# Patient Record
Sex: Female | Born: 1970 | Race: Black or African American | Hispanic: No | Marital: Married | Smoking: Never smoker
Health system: Southern US, Community
[De-identification: ages and names within clinical notes are randomized; demographics above are authoritative.]

## PROBLEM LIST (undated history)

## (undated) DIAGNOSIS — J45909 Unspecified asthma, uncomplicated: Secondary | ICD-10-CM

## (undated) DIAGNOSIS — T4145XA Adverse effect of unspecified anesthetic, initial encounter: Secondary | ICD-10-CM

## (undated) DIAGNOSIS — R569 Unspecified convulsions: Secondary | ICD-10-CM

## (undated) DIAGNOSIS — I340 Nonrheumatic mitral (valve) insufficiency: Secondary | ICD-10-CM

## (undated) DIAGNOSIS — K219 Gastro-esophageal reflux disease without esophagitis: Secondary | ICD-10-CM

## (undated) DIAGNOSIS — E119 Type 2 diabetes mellitus without complications: Secondary | ICD-10-CM

## (undated) DIAGNOSIS — I5189 Other ill-defined heart diseases: Secondary | ICD-10-CM

## (undated) DIAGNOSIS — J209 Acute bronchitis, unspecified: Secondary | ICD-10-CM

## (undated) DIAGNOSIS — D649 Anemia, unspecified: Secondary | ICD-10-CM

## (undated) DIAGNOSIS — G4733 Obstructive sleep apnea (adult) (pediatric): Secondary | ICD-10-CM

## (undated) DIAGNOSIS — R42 Dizziness and giddiness: Secondary | ICD-10-CM

## (undated) DIAGNOSIS — G4730 Sleep apnea, unspecified: Secondary | ICD-10-CM

## (undated) DIAGNOSIS — T7840XA Allergy, unspecified, initial encounter: Secondary | ICD-10-CM

## (undated) DIAGNOSIS — I10 Essential (primary) hypertension: Secondary | ICD-10-CM

## (undated) DIAGNOSIS — T8859XA Other complications of anesthesia, initial encounter: Secondary | ICD-10-CM

## (undated) HISTORY — DX: Acute bronchitis, unspecified: J20.9

## (undated) HISTORY — PX: REPLACEMENT TOTAL KNEE: SUR1224

## (undated) HISTORY — PX: GASTRIC BYPASS: SHX52

## (undated) HISTORY — DX: Gastro-esophageal reflux disease without esophagitis: K21.9

## (undated) HISTORY — DX: Allergy, unspecified, initial encounter: T78.40XA

---

## 1990-05-31 HISTORY — PX: FINGER SURGERY: SHX640

## 2004-11-18 ENCOUNTER — Emergency Department: Payer: Self-pay | Admitting: Emergency Medicine

## 2005-02-14 ENCOUNTER — Emergency Department: Payer: Self-pay | Admitting: Internal Medicine

## 2006-07-11 ENCOUNTER — Emergency Department: Payer: Self-pay | Admitting: Emergency Medicine

## 2008-05-31 HISTORY — PX: INGUINAL HERNIA REPAIR: SUR1180

## 2009-04-07 ENCOUNTER — Ambulatory Visit: Payer: Self-pay | Admitting: Internal Medicine

## 2009-04-08 ENCOUNTER — Ambulatory Visit: Payer: Self-pay | Admitting: Internal Medicine

## 2009-04-22 ENCOUNTER — Emergency Department: Payer: Self-pay | Admitting: Emergency Medicine

## 2009-04-29 ENCOUNTER — Ambulatory Visit: Payer: Self-pay | Admitting: Surgery

## 2009-05-05 ENCOUNTER — Ambulatory Visit: Payer: Self-pay | Admitting: Surgery

## 2009-05-09 ENCOUNTER — Ambulatory Visit: Payer: Self-pay | Admitting: Surgery

## 2009-07-10 ENCOUNTER — Ambulatory Visit: Payer: Self-pay | Admitting: Internal Medicine

## 2011-04-12 ENCOUNTER — Ambulatory Visit: Payer: Self-pay

## 2011-04-28 ENCOUNTER — Ambulatory Visit: Payer: Self-pay

## 2011-05-03 ENCOUNTER — Ambulatory Visit: Payer: Self-pay | Admitting: Internal Medicine

## 2011-05-07 ENCOUNTER — Ambulatory Visit: Payer: Self-pay | Admitting: Internal Medicine

## 2011-06-01 ENCOUNTER — Ambulatory Visit: Payer: Self-pay | Admitting: Internal Medicine

## 2011-07-20 ENCOUNTER — Ambulatory Visit: Payer: Self-pay | Admitting: Internal Medicine

## 2011-07-20 LAB — CBC CANCER CENTER
Basophil %: 0.5 %
Eosinophil #: 0.2 x10 3/mm (ref 0.0–0.7)
Eosinophil %: 2.9 %
HGB: 13.3 g/dL (ref 12.0–16.0)
Lymphocyte %: 42.4 %
MCHC: 33.5 g/dL (ref 32.0–36.0)
MCV: 89 fL (ref 80–100)
Monocyte %: 8.7 %
Neutrophil #: 2.4 x10 3/mm (ref 1.4–6.5)
Neutrophil %: 45.5 %
RBC: 4.45 10*6/uL (ref 3.80–5.20)
WBC: 5.4 x10 3/mm (ref 3.6–11.0)

## 2011-07-20 LAB — IRON AND TIBC
Iron Bind.Cap.(Total): 404 ug/dL (ref 250–450)
Iron: 80 ug/dL (ref 50–170)
Unbound Iron-Bind.Cap.: 324 ug/dL

## 2011-07-20 LAB — FERRITIN: Ferritin (ARMC): 36 ng/mL (ref 8–388)

## 2011-07-30 ENCOUNTER — Ambulatory Visit: Payer: Self-pay | Admitting: Internal Medicine

## 2011-12-08 ENCOUNTER — Ambulatory Visit: Payer: Self-pay | Admitting: Internal Medicine

## 2011-12-08 LAB — IRON AND TIBC
Iron Saturation: 12 %
Unbound Iron-Bind.Cap.: 363 ug/dL

## 2011-12-30 ENCOUNTER — Ambulatory Visit: Payer: Self-pay | Admitting: Internal Medicine

## 2012-03-20 ENCOUNTER — Ambulatory Visit: Payer: Self-pay | Admitting: Internal Medicine

## 2012-03-20 LAB — CBC CANCER CENTER
Eosinophil #: 0.2 x10 3/mm (ref 0.0–0.7)
MCHC: 32 g/dL (ref 32.0–36.0)
MCV: 89 fL (ref 80–100)
Monocyte #: 0.5 x10 3/mm (ref 0.2–0.9)
Monocyte %: 9.1 %
Neutrophil #: 2.5 x10 3/mm (ref 1.4–6.5)
Platelet: 218 x10 3/mm (ref 150–440)
RDW: 13.6 % (ref 11.5–14.5)
WBC: 5.3 x10 3/mm (ref 3.6–11.0)

## 2012-03-20 LAB — IRON AND TIBC
Iron Bind.Cap.(Total): 420 ug/dL (ref 250–450)
Iron Saturation: 19 %
Iron: 81 ug/dL (ref 50–170)
Unbound Iron-Bind.Cap.: 339 ug/dL

## 2012-03-20 LAB — FERRITIN: Ferritin (ARMC): 22 ng/mL (ref 8–388)

## 2012-03-31 ENCOUNTER — Ambulatory Visit: Payer: Self-pay | Admitting: Internal Medicine

## 2012-06-28 ENCOUNTER — Ambulatory Visit: Payer: Self-pay

## 2012-06-28 LAB — LIPID PANEL
Cholesterol: 158 mg/dL (ref 0–200)
HDL Cholesterol: 33 mg/dL — ABNORMAL LOW (ref 40–60)
Ldl Cholesterol, Calc: 100 mg/dL (ref 0–100)
Triglycerides: 125 mg/dL (ref 0–200)
VLDL Cholesterol, Calc: 25 mg/dL (ref 5–40)

## 2012-06-28 LAB — COMPREHENSIVE METABOLIC PANEL
Albumin: 3.4 g/dL (ref 3.4–5.0)
Alkaline Phosphatase: 100 U/L (ref 50–136)
BUN: 13 mg/dL (ref 7–18)
Chloride: 107 mmol/L (ref 98–107)
Creatinine: 0.76 mg/dL (ref 0.60–1.30)
EGFR (African American): >60
EGFR (Non-African Amer.): >60
Osmolality: 279 (ref 275–301)
Potassium: 3.5 mmol/L (ref 3.5–5.1)

## 2012-06-28 LAB — CBC WITH DIFFERENTIAL/PLATELET
Basophil %: 0.9 %
Eosinophil #: 0.2 10*3/uL (ref 0.0–0.7)
HGB: 12.6 g/dL (ref 12.0–16.0)
Lymphocyte #: 2.1 10*3/uL (ref 1.0–3.6)
Neutrophil %: 52.4 %
Platelet: 224 10*3/uL (ref 150–440)
RBC: 4.33 10*6/uL (ref 3.80–5.20)
RDW: 14.4 % (ref 11.5–14.5)
WBC: 5.9 10*3/uL (ref 3.6–11.0)

## 2012-07-20 ENCOUNTER — Ambulatory Visit: Payer: Self-pay | Admitting: Internal Medicine

## 2012-10-29 ENCOUNTER — Ambulatory Visit: Payer: Self-pay | Admitting: Internal Medicine

## 2012-11-02 ENCOUNTER — Ambulatory Visit (INDEPENDENT_AMBULATORY_CARE_PROVIDER_SITE_OTHER): Payer: BC Managed Care – PPO | Admitting: Sports Medicine

## 2012-11-02 ENCOUNTER — Encounter: Payer: Self-pay | Admitting: Sports Medicine

## 2012-11-02 VITALS — BP 137/94 | HR 87 | Ht 62.0 in | Wt 294.0 lb

## 2012-11-02 DIAGNOSIS — M1712 Unilateral primary osteoarthritis, left knee: Secondary | ICD-10-CM | POA: Insufficient documentation

## 2012-11-02 DIAGNOSIS — M1710 Unilateral primary osteoarthritis, unspecified knee: Secondary | ICD-10-CM

## 2012-11-02 DIAGNOSIS — IMO0002 Reserved for concepts with insufficient information to code with codable children: Secondary | ICD-10-CM

## 2012-11-02 MED ORDER — TRAMADOL HCL 50 MG PO TABS: 50.0000 mg | ORAL_TABLET | Freq: Four times a day (QID) | ORAL | Status: DC | PRN

## 2012-11-02 NOTE — Patient Instructions (Addendum)
Thank you for coming in today. We do not see much of a bakers cyst today.  I think we can help you a bit with a brace today.  You will likely benefit from a knee replacement.  Please follow up with Dr. Rosita Kea today.  I will send him a note.  Come back here as needed.

## 2012-11-02 NOTE — Assessment & Plan Note (Signed)
Patient has had resolution of her Baker's cyst in the interim due to decreased activity.  The current Baker's cyst is too small to aspirate today. Plan to followup with orthopedics for knee replacement

## 2012-11-02 NOTE — Progress Notes (Signed)
[name] is a 42 y.o. female who presents to [facility] today for right posterior knee swelling. Patient has been seen by Dr. Rosita Kea at [facility] orth[facility]. She was diagnosed with a right knee DJD ultimately require any replacement. She was seen in early May where she had a large Baker's cyst. She had a knee aspiration and injection. She is referred here for attempted ultrasound-guided aspiration and injection of the Baker's cyst prior to knee replacement.  However in the interim she has had significantly reduced activity and notes resolution of swelling. She continues to have significant pain with motion. She denies any radiating pain weakness or numbness   PMH reviewed. Morbid obesity History  Substance Use Topics  . Smoking status: Never Smoker   . Smokeless tobacco: Never Used  . Alcohol Use: Not on file   ROS as above otherwise neg   Exam:  BP 137/94  Pulse 87  Ht 5\' 2"  (1.575 m)  Wt 294 lb (133.358 kg)  BMI 53.76 kg/m2 Gen: Well NAD MSK: Left knee significantly obese without effusion Range of motion 0-120 1+ retropatellar crepitation Nontender No Baker's cyst  Gait: Significant antalgic gait with Trendelenburg features   Limited musculoskeletal ultrasound of the left posterior knee. Very small Baker's cyst present less than 1 cm in diameter.

## 2012-11-09 ENCOUNTER — Ambulatory Visit: Payer: Self-pay | Admitting: Orthopedic Surgery

## 2012-11-28 ENCOUNTER — Ambulatory Visit: Payer: Self-pay | Admitting: Internal Medicine

## 2012-12-08 LAB — CBC CANCER CENTER
Eosinophil #: 0.2 x10 3/mm (ref 0.0–0.7)
Eosinophil %: 2.6 %
HCT: 39.1 % (ref 35.0–47.0)
HGB: 13.5 g/dL (ref 12.0–16.0)
Lymphocyte %: 28.5 %
MCHC: 34.6 g/dL (ref 32.0–36.0)
MCV: 90 fL (ref 80–100)
Neutrophil #: 3.8 x10 3/mm (ref 1.4–6.5)
Neutrophil %: 59.3 %
RBC: 4.37 10*6/uL (ref 3.80–5.20)

## 2012-12-08 LAB — IRON AND TIBC
Iron Bind.Cap.(Total): 371 ug/dL (ref 250–450)
Iron Saturation: 12 %
Iron: 43 ug/dL — ABNORMAL LOW (ref 50–170)
Unbound Iron-Bind.Cap.: 328 ug/dL

## 2012-12-11 ENCOUNTER — Ambulatory Visit: Payer: Self-pay | Admitting: Orthopedic Surgery

## 2012-12-11 LAB — URINALYSIS, COMPLETE
Bacteria: NONE SEEN
Bilirubin,UR: NEGATIVE
Glucose,UR: NEGATIVE mg/dL (ref 0–75)
Ketone: NEGATIVE
Nitrite: NEGATIVE
Protein: NEGATIVE
RBC,UR: 2 /HPF (ref 0–5)
Squamous Epithelial: 10
WBC UR: 4 /HPF (ref 0–5)

## 2012-12-11 LAB — MRSA PCR SCREENING

## 2012-12-11 LAB — BASIC METABOLIC PANEL
Anion Gap: 4 — ABNORMAL LOW (ref 7–16)
Chloride: 107 mmol/L (ref 98–107)
Glucose: 105 mg/dL — ABNORMAL HIGH (ref 65–99)
Osmolality: 279 (ref 275–301)
Potassium: 4.1 mmol/L (ref 3.5–5.1)
Sodium: 141 mmol/L (ref 136–145)

## 2012-12-11 LAB — CBC
HGB: 13.4 g/dL (ref 12.0–16.0)
MCH: 30.3 pg (ref 26.0–34.0)
RBC: 4.42 10*6/uL (ref 3.80–5.20)
RDW: 13.7 % (ref 11.5–14.5)

## 2012-12-11 LAB — APTT: Activated PTT: 28.7 secs (ref 23.6–35.9)

## 2012-12-19 ENCOUNTER — Inpatient Hospital Stay: Payer: Self-pay | Admitting: Orthopedic Surgery

## 2012-12-20 LAB — BASIC METABOLIC PANEL
BUN: 6 mg/dL — ABNORMAL LOW (ref 7–18)
Calcium, Total: 8.6 mg/dL (ref 8.5–10.1)
Co2: 30 mmol/L (ref 21–32)
Creatinine: 0.84 mg/dL (ref 0.60–1.30)
EGFR (African American): >60
Osmolality: 271 (ref 275–301)

## 2012-12-21 LAB — PATHOLOGY REPORT

## 2012-12-29 ENCOUNTER — Ambulatory Visit: Payer: Self-pay | Admitting: Internal Medicine

## 2012-12-29 ENCOUNTER — Ambulatory Visit: Payer: Self-pay | Admitting: Orthopedic Surgery

## 2013-03-13 ENCOUNTER — Ambulatory Visit: Payer: Self-pay | Admitting: Internal Medicine

## 2013-03-13 LAB — IRON AND TIBC
Iron Bind.Cap.(Total): 334 ug/dL (ref 250–450)
Iron Saturation: 15 %
Iron: 49 ug/dL — ABNORMAL LOW (ref 50–170)
Unbound Iron-Bind.Cap.: 285 ug/dL

## 2013-03-13 LAB — CANCER CENTER HEMOGLOBIN: HGB: 13.3 g/dL (ref 12.0–16.0)

## 2013-03-28 ENCOUNTER — Ambulatory Visit: Payer: Self-pay

## 2013-03-31 ENCOUNTER — Ambulatory Visit: Payer: Self-pay | Admitting: Internal Medicine

## 2013-04-17 ENCOUNTER — Ambulatory Visit: Payer: Self-pay | Admitting: Orthopedic Surgery

## 2013-08-07 ENCOUNTER — Ambulatory Visit: Payer: Self-pay | Admitting: Internal Medicine

## 2013-08-22 DIAGNOSIS — G4733 Obstructive sleep apnea (adult) (pediatric): Secondary | ICD-10-CM | POA: Insufficient documentation

## 2013-09-04 ENCOUNTER — Ambulatory Visit: Payer: Self-pay | Admitting: Specialist

## 2013-09-13 ENCOUNTER — Ambulatory Visit: Payer: Self-pay | Admitting: Specialist

## 2013-09-23 ENCOUNTER — Emergency Department: Payer: Self-pay | Admitting: Emergency Medicine

## 2013-09-23 LAB — CBC
HCT: 42.2 % (ref 35.0–47.0)
HGB: 14.2 g/dL (ref 12.0–16.0)
MCH: 30.6 pg (ref 26.0–34.0)
MCHC: 33.6 g/dL (ref 32.0–36.0)
MCV: 91 fL (ref 80–100)
Platelet: 225 10*3/uL (ref 150–440)
RBC: 4.63 10*6/uL (ref 3.80–5.20)
RDW: 13.7 % (ref 11.5–14.5)
WBC: 6.7 10*3/uL (ref 3.6–11.0)

## 2013-09-23 LAB — BASIC METABOLIC PANEL
Anion Gap: 7 (ref 7–16)
BUN: 5 mg/dL — ABNORMAL LOW (ref 7–18)
CO2: 27 mmol/L (ref 21–32)
Calcium, Total: 9.1 mg/dL (ref 8.5–10.1)
Chloride: 106 mmol/L (ref 98–107)
Creatinine: 0.82 mg/dL (ref 0.60–1.30)
EGFR (African American): >60
EGFR (Non-African Amer.): >60
GLUCOSE: 120 mg/dL — AB (ref 65–99)
OSMOLALITY: 278 (ref 275–301)
Potassium: 3.3 mmol/L — ABNORMAL LOW (ref 3.5–5.1)
SODIUM: 140 mmol/L (ref 136–145)

## 2013-09-23 LAB — CK TOTAL AND CKMB (NOT AT ARMC)
CK, Total: 138 U/L
CK-MB: <0.5 ng/mL — ABNORMAL LOW (ref 0.5–3.6)

## 2013-09-28 ENCOUNTER — Ambulatory Visit: Payer: Self-pay | Admitting: Specialist

## 2013-10-23 ENCOUNTER — Ambulatory Visit: Payer: Self-pay | Admitting: Gastroenterology

## 2013-11-13 ENCOUNTER — Ambulatory Visit: Payer: Self-pay | Admitting: Gastroenterology

## 2013-11-15 LAB — PATHOLOGY REPORT

## 2013-12-17 ENCOUNTER — Ambulatory Visit: Payer: Self-pay | Admitting: Specialist

## 2013-12-17 LAB — BASIC METABOLIC PANEL
ANION GAP: 5 — AB (ref 7–16)
BUN: 10 mg/dL (ref 7–18)
CALCIUM: 9 mg/dL (ref 8.5–10.1)
CO2: 27 mmol/L (ref 21–32)
Chloride: 103 mmol/L (ref 98–107)
Creatinine: 0.91 mg/dL (ref 0.60–1.30)
EGFR (Non-African Amer.): >60
Glucose: 88 mg/dL (ref 65–99)
Osmolality: 269 (ref 275–301)
Potassium: 4.3 mmol/L (ref 3.5–5.1)
Sodium: 135 mmol/L — ABNORMAL LOW (ref 136–145)

## 2013-12-17 LAB — CBC WITH DIFFERENTIAL/PLATELET
BASOS PCT: 0.9 %
Basophil #: 0 10*3/uL (ref 0.0–0.1)
Eosinophil #: 0.2 10*3/uL (ref 0.0–0.7)
Eosinophil %: 3.5 %
HCT: 41.6 % (ref 35.0–47.0)
HGB: 13.5 g/dL (ref 12.0–16.0)
LYMPHS PCT: 37.4 %
Lymphocyte #: 1.8 10*3/uL (ref 1.0–3.6)
MCH: 30.1 pg (ref 26.0–34.0)
MCHC: 32.6 g/dL (ref 32.0–36.0)
MCV: 92 fL (ref 80–100)
MONO ABS: 0.6 x10 3/mm (ref 0.2–0.9)
MONOS PCT: 11.3 %
NEUTROS PCT: 46.9 %
Neutrophil #: 2.3 10*3/uL (ref 1.4–6.5)
PLATELETS: 247 10*3/uL (ref 150–440)
RBC: 4.5 10*6/uL (ref 3.80–5.20)
RDW: 13.4 % (ref 11.5–14.5)
WBC: 4.9 10*3/uL (ref 3.6–11.0)

## 2013-12-25 ENCOUNTER — Inpatient Hospital Stay: Payer: Self-pay | Admitting: Specialist

## 2013-12-26 LAB — CBC WITH DIFFERENTIAL/PLATELET
Basophil #: 0 10*3/uL (ref 0.0–0.1)
Basophil %: 0.3 %
EOS PCT: 0 %
Eosinophil #: 0 10*3/uL (ref 0.0–0.7)
HCT: 39.3 % (ref 35.0–47.0)
HGB: 12.6 g/dL (ref 12.0–16.0)
LYMPHS ABS: 1.4 10*3/uL (ref 1.0–3.6)
Lymphocyte %: 12.4 %
MCH: 29.4 pg (ref 26.0–34.0)
MCHC: 32.1 g/dL (ref 32.0–36.0)
MCV: 92 fL (ref 80–100)
MONO ABS: 1.1 x10 3/mm — AB (ref 0.2–0.9)
Monocyte %: 9.3 %
NEUTROS ABS: 9 10*3/uL — AB (ref 1.4–6.5)
NEUTROS PCT: 78 %
Platelet: 261 10*3/uL (ref 150–440)
RBC: 4.29 10*6/uL (ref 3.80–5.20)
RDW: 13.3 % (ref 11.5–14.5)
WBC: 11.6 10*3/uL — ABNORMAL HIGH (ref 3.6–11.0)

## 2013-12-26 LAB — BASIC METABOLIC PANEL
Anion Gap: 7 (ref 7–16)
BUN: 6 mg/dL — ABNORMAL LOW (ref 7–18)
CHLORIDE: 105 mmol/L (ref 98–107)
Calcium, Total: 9 mg/dL (ref 8.5–10.1)
Co2: 25 mmol/L (ref 21–32)
Creatinine: 0.9 mg/dL (ref 0.60–1.30)
EGFR (Non-African Amer.): >60
Glucose: 111 mg/dL — ABNORMAL HIGH (ref 65–99)
OSMOLALITY: 272 (ref 275–301)
Potassium: 4.5 mmol/L (ref 3.5–5.1)
SODIUM: 137 mmol/L (ref 136–145)

## 2013-12-26 LAB — ALBUMIN: ALBUMIN: 3.2 g/dL — AB (ref 3.4–5.0)

## 2013-12-26 LAB — MAGNESIUM: Magnesium: 1.5 mg/dL — ABNORMAL LOW

## 2013-12-26 LAB — PHOSPHORUS: PHOSPHORUS: 3.2 mg/dL (ref 2.5–4.9)

## 2014-01-15 ENCOUNTER — Ambulatory Visit: Payer: Self-pay | Admitting: Specialist

## 2014-01-29 ENCOUNTER — Ambulatory Visit: Payer: Self-pay | Admitting: Specialist

## 2014-02-28 ENCOUNTER — Ambulatory Visit: Payer: Self-pay | Admitting: Specialist

## 2014-04-19 ENCOUNTER — Emergency Department: Payer: Self-pay | Admitting: Emergency Medicine

## 2014-07-05 ENCOUNTER — Ambulatory Visit: Payer: Self-pay

## 2014-08-02 ENCOUNTER — Emergency Department: Payer: Self-pay | Admitting: Emergency Medicine

## 2014-09-20 NOTE — Op Note (Signed)
PATIENT NAME:  [name], [name] MR#:  [id number] DATE OF BIRTH:  [birth year]  DATE OF PROCEDURE:  12/19/2012  PREOPERATIVE DIAGNOSIS: Osteoarthritis, right knee.   POSTOPERATIVE DIAGNOSIS: Osteoarthritis, right knee.  PROCEDURE: Right total knee replacement.   ANESTHESIA: Spinal.   SURGEON: Leitha Schuller, MD   ASSISTANT: Cranston Neighbor, PA-C   DESCRIPTION OF PROCEDURE: The patient was brought to the operating room, and after adequate anesthesia was obtained the right leg was prepped and draped in the usual sterile fashion with a tourniquet applied to the upper thigh. After prepping and draping in the usual sterile fashion, with the Alvarado leg holder being utilized, appropriate patient identification and timeout procedures were completed. The leg was exsanguinated with an Esmarch and the tourniquet raised to 350 mmHg. A midline skin incision was made, followed by a medial parapatellar arthrotomy. Inspection revealed marked synovitis within the joint. There was medial compartment and patellofemoral moderate arthritic changes. After initial exposure, a specimen of the synovium was sent as a separate specimen for pathologic analysis. Next, the ACL was excised along with the fat pad which also was quite scarred down. The proximal tibia was exposed for the Medacta cutting blocks for the distal femur and proximal tibia. The tibial block was applied and the proximal tibia cut created. The distal femoral cut was made in a similar fashion, the 3 cutting block applied to the distal femur and the anterior posterior and chamfer cuts made. The residual meniscus was excised at this time medially and laterally, and the tibia had best coverage with a size 2.  Rotation was based on 1 of the pins from the initial cutting block. The 10 mm insert trial gave very good stability and alignment, but there was better stability with a 12, and so the 12 mm standard implant was chosen. After these trial components had been  placed, the distal notch cut for the femur was created as well as drill holes for the pegs of the femur for the femoral component. The patella was cut using the guide and measured a size 2 with 3 drill holes made. At this point, all trial components were removed, and periarticular injection of Exparel and another injection of Sensorcaine, morphine and Toradol were infiltrated as well to aid in immediate postop relief. At this point, the bony surfaces were thoroughly irrigated and dried. The tibial component was cemented into place first, followed by the tibial insert, and then the femoral component with excess cement removed. A patellar button was clamped into place and the knee held in extension as the cement set. Next, the excess cement was removed after the cement was hard. The patella tracked well with no touch technique. The arthrotomy was then repaired after thorough irrigation of the joint with Ethibond along the medial retinaculum and a running heavy quill suture for watertight closure of the capsule, 2-0 subcutaneous quill and skin staples. A wound VAC dressing was applied. The patient was sent to recovery in stable condition.   ESTIMATED BLOOD LOSS: 100.   TOURNIQUET TIME: Approximately 90 minutes at 350 mmHg.  COMPLICATIONS: None.   SPECIMEN: Cut ends of bone.   IMPLANTS: Medacta GMK primary total knee system, size 3N right standard femur, size 2, 12 mm standard tibial insert, a right fixed size 2 tibial tray and a size 2 patella, all components cemented.   ____________________________ Leitha Schuller, MD mjm:cb D: 12/19/2012 17:30:09 ET T: 12/19/2012 20:13:09 ET JOB#: [id number]  cc: Leitha Schuller, MD, <Dictator> Jaclene Bartelt J  [facility] MD ELECTRONICALLY SIGNED 12/19/2012 23:15

## 2014-09-20 NOTE — Discharge Summary (Signed)
PATIENT NAME:  [name], [name] MR#:  [id number] DATE OF BIRTH:  1970/07/03  DATE OF ADMISSION:  12/19/2012 DATE OF DISCHARGE:  12/21/2012   ADMITTING DIAGNOSIS: Osteoarthritis, right knee.   DISCHARGE DIAGNOSIS: Osteoarthritis, right knee.   PROCEDURE: Right total knee replacement.   ANESTHESIA: Spinal.   SURGEON: Leitha SchullerMichael J. Menz, MD  ASSISTANT: Cranston Neighborhris Gaines, PA-C   ESTIMATED BLOOD LOSS: 100 mL.  TOURNIQUET TIME: Approximately 90 minutes at 350 mmHg.   COMPLICATIONS: None.   SPECIMEN: Cut ends of bone.   IMPLANTS: Medacta GMK primary total knee system, size 3N right standard femur, size 2, 12 mm standard tibial insert, a right fixed size 2 tibial tray and a size 2 patella, all components cemented.   HISTORY: The patient is a 44 year old with significant right knee osteoarthritis. She has had a large Baker's cyst and was going [place] but when she went there, there was nothing to aspirate. She was still having a great deal of pain. She has pain in the posterior knee as well as along the medial knee. She has medial joint line crepitation pain, pain at rest and difficulty doing her job. She works at [employer]. She is having a lot of trouble getting into and out of chairs.   PHYSICAL EXAMINATION:  LUNGS: Clear to auscultation.  HEART: Regular rate and rhythm.  HEENT: Normal.  RIGHT KNEE: She has 5 to 100 degrees of range of motion. She has patellofemoral crepitation. She has a varus deformity that is passively correctable. She has palpable dorsalis pedis and posterior tibialis pulse. Sensation is intact to the foot. She is able to flex and extend the toes.   HOSPITAL COURSE: The patient was admitted to the hospital on 12/19/2012. She had surgery that same day and was brought to the orthopedic floor from the PACU in stable condition. On postoperative day 1, the patient had good progress with physical therapy. Her pain and vital signs were well controlled.  On postoperative day 2, the patient had significant increase in progress with physical therapy. Her pain was moderate, and she was doing well, and vital signs were stable. On postoperative day 3, the patient continued to progress with physical therapy. Her pain was controlled, and she was ready for discharge to home with home health physical therapy.   CONDITION AT DISCHARGE: Stable.   DISCHARGE INSTRUCTIONS:  1. She may gradually increase weight-bearing on the right lower extremity.  2. She needs to wear knee-high TED hose on both legs and remove at bedtime and replace when arising the next morning.  3. Elevate the heels off the bed.  4. Encourage cough and deep breathing.  5. She may resume a regular diet as tolerated.  6. Do not get the dressing or bandage wet or dirty. Call [facility] if the dressing gets water under it.  7. Call [facility] if any of the following occur: Bright red bleeding from the incision or wound, fever above 101.5 degrees, redness, swelling or drainage at the incision. Call [facility] if you experience any increased leg pain, numbness or weakness in your legs or bowel or bladder symptoms.  8. Home physical therapy has been arranged for her rehab program. She needs to call [facility] if a therapist has not contacted her within 48 hours of her return home.  keep dressin on until followup appointment in 2 weeks. She needs to follow up in 2 weeks.   DISCHARGE MEDICATIONS:  1. Ibuprofen 800 mg  oral tablet 1 tablet orally 3 times a day as needed for migraine or for pain. 2. Albuterol/ipratropium 2.5 mg/0.5 mg 3 mL inhalation solution 3 mL 4 times a day as needed for shortness of breath.  3. Vitamin D3 1000 international units 2 tabs orally once a day.  4. Omeprazole 20 mg 1 tablet orally once a day.  5. Tramadol 50 mg oral tablet 1 tablet orally every 6 hours as needed for pain. 6. Bystolic 20 mg oral tablet 1 tablet orally once a day in the morning. 7. Montelukast 10 mg oral tablet  1 tablet orally once day at bedtime.  8. Amlodipine 5 mg oral tablet 1 tablet orally once a day at bedtime. 9. Hemocyte Plus multiple vitamins with minerals oral capsule 1 cap orally once a day at bedtime.  10. Tylenol 500 mg 1 tablet orally every 4 hours as needed for pain or temperature greater than 100.4.  11. Oxycodone 5 mg oral tablet 1 tablet orally every 4 hours as needed for pain.  12. Magnesium hydroxide 8% oral suspension 30 mL orally 2 times a day as needed for constipation.  13. Aluminum hydroxide/magnesium hydroxide/simethicone 400 mg/400 mg/40 mg per 5 mL oral suspension 30 mL orally every 6 hours as needed for indigestion or heartburn.  14. Zolpidem 5 mg oral tablet 1 tablet orally once a day at bedtime.  15. Bisacodyl 10 mg rectal suppository 1 suppository rectal once a day as needed for constipation. 16. Docusate/senna 50 mg/8.6 mg oral tablet 1 tablet orally 2 times a day.   ____________________________ T. Cranston Neighbor, PA-C tcg:OSi D: 12/21/2012 12:33:00 ET T: 12/21/2012 13:02:58 ET JOB#: [id number]  cc: T. Cranston Neighbor, PA-C, <Dictator> Evon Slack [place] ELECTRONICALLY SIGNED 01/01/2013 12:26

## 2014-09-20 NOTE — Op Note (Signed)
PATIENT NAME:  [name], Akeela E MR#:  [id number] DATE OF BIRTH:  Dec 17, 1970  DATE OF PROCEDURE:  04/17/2013  PREOPERATIVE DIAGNOSIS: Right knee arthrofibrosis.   POSTOPERATIVE DIAGNOSIS: Right knee arthrofibrosis.   PROCEDURE: Right knee manipulation.   ANESTHESIA: General.   SURGEON: Leitha SchullerMichael J. Kamill Fulbright, M.D.   DESCRIPTION OF PROCEDURE: The patient was brought to the operating room and after adequate anesthesia was obtained, appropriate patient identification, timeout procedures were completed. Initially, flexion of the knee was approximately 80 degrees just holding the hip in flexion and allowing the knee to passively flex. Gentle pressure was applied to the proximal tibia and approximately 110 degrees of flexion was obtained with audible and palpable popping of adhesions. The knee in full extension as well. The patient was then sent to the recovery room in stable condition. There were no complications. No specimen. No blood loss.   ____________________________ Leitha SchullerMichael J. Venda Dice, MD mjm:gb D: 04/17/2013 21:14:51 ET T: 04/17/2013 21:52:58 ET JOB#: [id number]  cc: Leitha SchullerMichael J. Elaine Roanhorse, MD, <Dictator> Leitha SchullerMICHAEL J Plato Alspaugh MD ELECTRONICALLY SIGNED 04/18/2013 8:16

## 2014-09-21 NOTE — Op Note (Signed)
PATIENT NAME:  [name], [name] MR#:  [id number] DATE OF BIRTH:  1970/11/17  DATE OF PROCEDURE:  12/25/2013  PREOPERATIVE DIAGNOSES:  Morbid obesity and gastroesophageal reflux disease.  POSTOPERATIVE DIAGNOSES:  Morbid obesity, hiatal hernia, epigastric and umbilical hernias.   PROCEDURE:  Laparoscopic Roux-en-Y gastric bypass with laparoscopic hiatal hernia repair.  SURGEON:  Primus BravoJon Bruce, MD  ASSISTANT:  Anabel Halonon Drinkwater, PA  ANESTHESIA: General endotracheal.  COMPLICATIONS: None.   FINDINGS: Epigastric and umbilical hernias, which will need to be repaired once the patient loses weight.  CLINICAL HISTORY:  See History and Physical.  DETAILS OF THE PROCEDURE:  The patient was taken to the operating room and placed on the operating table in the supine position.  Appropriate monitors and supplemental oxygen were delivered.  Broad-spectrum IV antibiotics were administered.  Sequential stockings were placed.  The abdomen was prepped and draped in the usual sterile fashion after the smooth induction of general anesthesia.  The abdomen was accessed using a 5-ml optical trocar in the left upper quadrant.  Pneumoperitoneum was established without difficulty.  Multiple other ports were placed in preparation for gastric bypass.  The small bowel was identified at the ligament of Treitz and run for 50 cm distal to that point.  It was then divided using an Echelon white load stapler reinforced with seam guard.  The Harmonic scalpel was used to take down the mesentery to its base.  A (Dictation Anomaly) 125  cm Roux limb was then created and a side-to-side jejunojejunostomy was created in the following fashion.  A tacking stitch was used using 2-0 Surgidac suture.  An enterotomy was made in both limbs and the Echelon white load stapler was inserted to its hub, clamped, and fired for its full 6-cm length.  The ensuing defect was closed using interrupted sutures to retract the enterotomy up and stapled across with  the Echelon blue-load stapler with good effect.  A baby's butt stitch was placed times one using 2-0 Surgidac suture.  A stay suture was placed anteriorly using a 2-0 Surgidac suture and the mesenteric defect was closed using running 2-0 Surgidac suture.  The omentum was divided then in the midline to create a path for the Roux limb to be lifted and elevated up to the gastric pouch.  The liver retractor was placed and the liver was retracted anteriorly and cephalad.  The patient was placed in steep reverse Trendelenburg after the Roux limb was tacked to the underside of the stomach using a 2-0 Surgidac suture.  All structures were removed from the stomach.  The pars flaccida was taken down using a Harmonic scalpel to the second gastric vein.  At that point the Echelon gold load reinforced with seam guard was used to fire multiple times creating a small 30 to 45-ml pouch.  Hemostasis was excellent.  The posterior pouch was then freed of all fibrofatty tissue in preparation for anastomosis.  The small bowel Roux limb was then mobilized up into the upper abdomen and tacked to the left side of the pouch with an interrupted 2-0 Surgidac suture.  Enterotomies were made in the gastric pouch and in the small bowel and the Echelon blue load stapler was inserted to 28 mm and clamped.  This was then fired and the ensuing defect was closed using a layer of 2-0 Polysorb suture times one.  A 34 French blunt-tipped bougie was inserted trans-orally down past the anastomosis and the second layer of closure occurred with two running 2-0 Polysorb sutures  from either side, creating a two-layer anastomosis.  The bougie was removed and the proximal Roux limb was clamped.  This was submerged underneath saline and endoscope was placed trans-orally and guided all the way down to the anastomosis without difficulty.  The anastomosis was widely patent and hemostasis was excellent.  No leak was present under high-pressure insufflation with the  proximal Roux limb clamp.  The endoscopes were removed and I re-gowned and re-gloved.  We took the omentum and draped that over the top of the anastomosis and sutured it in place using a 2-0 Surgidac suture.  The Peterson's defect was then closed using running 2-0 Surgidac suture. A drain was placed in the left upper quadrant over the anastomosis of 19 [place].  The liver retractor and trocars were removed without incident.  No bleeding occurred from any of these sites.  The wounds were then closed using 4-0 Vicryl and Dermabond.  The patient tolerated the procedure well, arrived in the recovery room in stable condition, and will be admitted to my care.  ADDENDUM #1: The hiatal hernia was closed in the usual fashion by reapproximating the posterior crura after intramediastinal dissection using interrupted 0 Surgidac sutures.   ADDENDUM #2: An epigastric and umbilical hernia were found. These should be closed after the patient loses weight and could be done with a simple laparoscopic procedure. ____________________________ Primus Bravo, MD jb:sb D: 12/25/2013 10:01:50 ET T: 12/25/2013 10:18:48 ET JOB#: [id number]  cc: Primus Bravo, MD, <Dictator> Lyndon Code, MD Geoffry Paradise MD ELECTRONICALLY SIGNED 12/26/2013 8:48

## 2014-11-03 ENCOUNTER — Emergency Department
Admission: EM | Admit: 2014-11-03 | Discharge: 2014-11-04 | Disposition: A | Discharge status: Home / Self Care | Payer: 59 | Attending: Emergency Medicine | Admitting: Emergency Medicine

## 2014-11-03 ENCOUNTER — Other Ambulatory Visit: Payer: Self-pay

## 2014-11-03 ENCOUNTER — Encounter: Payer: Self-pay | Admitting: Emergency Medicine

## 2014-11-03 DIAGNOSIS — R110 Nausea: Secondary | ICD-10-CM | POA: Insufficient documentation

## 2014-11-03 DIAGNOSIS — Z79899 Other long term (current) drug therapy: Secondary | ICD-10-CM | POA: Insufficient documentation

## 2014-11-03 DIAGNOSIS — I10 Essential (primary) hypertension: Secondary | ICD-10-CM | POA: Insufficient documentation

## 2014-11-03 DIAGNOSIS — R55 Syncope and collapse: Secondary | ICD-10-CM | POA: Diagnosis present

## 2014-11-03 DIAGNOSIS — R002 Palpitations: Secondary | ICD-10-CM | POA: Diagnosis not present

## 2014-11-03 HISTORY — DX: Sleep apnea, unspecified: G47.30

## 2014-11-03 HISTORY — DX: Anemia, unspecified: D64.9

## 2014-11-03 HISTORY — DX: Unspecified asthma, uncomplicated: J45.909

## 2014-11-03 HISTORY — DX: Essential (primary) hypertension: I10

## 2014-11-03 LAB — CBC
HEMATOCRIT: 39.1 % (ref 35.0–47.0)
Hemoglobin: 13 g/dL (ref 12.0–16.0)
MCH: 30.3 pg (ref 26.0–34.0)
MCHC: 33.1 g/dL (ref 32.0–36.0)
MCV: 91.4 fL (ref 80.0–100.0)
Platelets: 262 10*3/uL (ref 150–440)
RBC: 4.28 MIL/uL (ref 3.80–5.20)
RDW: 12.7 % (ref 11.5–14.5)
WBC: 4.2 10*3/uL (ref 3.6–11.0)

## 2014-11-03 NOTE — ED Notes (Signed)
EMS pt from home following witnessed syncopal episode at home and 2 episodes witnessed by EMS; pt arrives awake and alert; talking in complete coherent sentences; pt says she had just eaten some M [employer] when she became dizzy, vomited and passed out

## 2014-11-04 ENCOUNTER — Emergency Department: Payer: 59

## 2014-11-04 LAB — COMPREHENSIVE METABOLIC PANEL
ALBUMIN: 4.4 g/dL (ref 3.5–5.0)
ALT: 28 U/L (ref 14–54)
ANION GAP: 8 (ref 5–15)
AST: 29 U/L (ref 15–41)
Alkaline Phosphatase: 83 U/L (ref 38–126)
BUN: 13 mg/dL (ref 6–20)
CO2: 27 mmol/L (ref 22–32)
CREATININE: 0.82 mg/dL (ref 0.44–1.00)
Calcium: 9.4 mg/dL (ref 8.9–10.3)
Chloride: 105 mmol/L (ref 101–111)
GFR calc Af Amer: >60 mL/min (ref >60)
GFR calc non Af Amer: >60 mL/min (ref >60)
GLUCOSE: 82 mg/dL (ref 65–99)
POTASSIUM: 4 mmol/L (ref 3.5–5.1)
Sodium: 140 mmol/L (ref 135–145)
TOTAL PROTEIN: 8.2 g/dL — AB (ref 6.5–8.1)
Total Bilirubin: 0.7 mg/dL (ref 0.3–1.2)

## 2014-11-04 LAB — URINALYSIS COMPLETE WITH MICROSCOPIC (ARMC ONLY)
BILIRUBIN URINE: NEGATIVE
Glucose, UA: NEGATIVE mg/dL
Ketones, ur: NEGATIVE mg/dL
LEUKOCYTES UA: NEGATIVE
Nitrite: NEGATIVE
PH: 5 (ref 5.0–8.0)
Protein, ur: NEGATIVE mg/dL
SPECIFIC GRAVITY, URINE: 1.029 (ref 1.005–1.030)

## 2014-11-04 LAB — TROPONIN I
Troponin I: <0.03 ng/mL (ref <0.031)
Troponin I: <0.03 ng/mL (ref <0.031)

## 2014-11-04 MED ORDER — SODIUM CHLORIDE 0.9 % IV BOLUS (SEPSIS)
1000.0000 mL | Freq: Once | INTRAVENOUS | Status: AC
  Administered 2014-11-04: 1000 mL via INTRAVENOUS

## 2014-11-04 NOTE — ED Provider Notes (Signed)
[facility] Emergency Department Provider Note  ____________________________________________  Time seen: Approximately 0047 AM  I have reviewed the triage vital signs and the nursing notes.   HISTORY  Chief Complaint Loss of Consciousness    HPI [name] is a 44 y.o. female who comes in with multiple episodes of syncope tonight. The patient reports that she was eating M&Ms and lay down afterwards. She reports that she started feeling nauseous and her heart was beating fast. She sat up and went to the bathroom where she reports that she vomited. She reports after the episode of emesis she was still nauseous and feeling hot and clammy. The patient went back into the bathroom checked her blood sugar and was found to have a blood sugar of 152. Warts that she was in her bedroom and she still felt as though her heart was beating fast. She reports that her mother was talking to her and telling her to drink some water but the patient felt full. The patient reports that while she was sitting on the bed speaking with her mother she had an episode of syncope. Per the family the patient was only out for 1-2 minutes with no seizure activity. That the family then called the ambulance. The paramedics did come to see the patient and while the patient was speaking to them she had 2 other episodes of syncope with again 1-2 minutes of being unconscious. The patient reports that she has had 3 menstrual cycles this past month. She reports it is been heavier than it has been previously and she is unsure as to why. She reports currently though she is not having any bleeding. She reports that she did feel sweaty but did not have any chest pain or headache prior to the symptoms beginning.   Past Medical History  Diagnosis Date  . Asthma   . Sleep apnea   . Anemia   . Hypertension   . GERD (gastroesophageal reflux disease)     Patient Active Problem List   Diagnosis Date Noted  .  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 Replacement total knee    . Gastric bypass      Current Outpatient Rx  Name  Route  Sig  Dispense  Refill  . albuterol (PROVENTIL HFA;VENTOLIN HFA) 108 (90 BASE) MCG/ACT inhaler   Inhalation   Inhale 2 puffs into the lungs every 6 (six) hours as needed for wheezing or shortness of breath.         . calcium-vitamin D (OSCAL WITH D) 500-200 MG-UNIT per tablet   Oral   Take 1 tablet by mouth.         Marland Kitchen ipratropium-albuterol (DUONEB) 0.5-2.5 (3) MG/3ML SOLN   Nebulization   Take 3 mLs by nebulization every 4 (four) hours as needed.         . Multiple Vitamin (MULTIVITAMIN) tablet   Oral   Take 1 tablet by mouth daily.         . vitamin B-12 (CYANOCOBALAMIN) 50 MCG tablet   Oral   Take 50 mcg by mouth daily.         Marland Kitchen amLODipine (NORVASC) 5 MG tablet   Oral   Take 5 mg by mouth daily.         . Fe Fum-FA-B Cmp-C-Zn-Mg-Mn-Cu (HEMOCYTE PLUS) 106-1 MG CAPS   Oral   Take 1 capsule by mouth daily.         Marland Kitchen  ibuprofen (ADVIL,MOTRIN) 800 MG tablet   Oral   Take 800 mg by mouth 3 (three) times daily.         . montelukast (SINGULAIR) 10 MG tablet   Oral   Take 10 mg by mouth daily.         . Nebivolol HCl (BYSTOLIC) 20 MG TABS   Oral   Take 20 mg by mouth daily.         Marland Kitchen omeprazole (PRILOSEC) 20 MG capsule   Oral   Take 20 mg by mouth daily.         . traMADol (ULTRAM) 50 MG tablet   Oral   Take 1 tablet (50 mg total) by mouth every 6 (six) hours as needed for pain.   100 tablet   1     Allergies Review of patient's allergies indicates no known allergies.  History reviewed. No pertinent family history.  Social History History  Substance Use Topics  . Smoking status: Never Smoker   . Smokeless tobacco: Never Used  . Alcohol Use: No    Review of Systems Constitutional: No fever/chills Eyes: No visual changes. ENT: No sore throat. Cardiovascular:  Palpitations Respiratory:shortness of breath. Gastrointestinal: Abdominal pain nausea and vomiting Genitourinary: Negative for dysuria. Musculoskeletal: Negative for back pain. Skin: Negative for rash. Neurological: Syncope  10-point ROS otherwise negative.  ____________________________________________   PHYSICAL EXAM:  VITAL SIGNS: ED Triage Vitals  Enc Vitals Group     BP 11/03/14 2343 126/86 mmHg     Pulse Rate 11/03/14 2343 77     Resp 11/03/14 2343 15     Temp 11/03/14 2343 98.1 F (36.7 C)     Temp Source 11/03/14 2343 Oral     SpO2 11/03/14 2343 100 %     Weight 11/03/14 2343 205 lb (92.987 kg)     Height 11/03/14 2343  (1.575 m)     Head Cir --      Peak Flow --      Pain Score 11/03/14 2345 0     Pain Loc --      Pain Edu? --      Excl. in GC? --     Constitutional: Alert and oriented. Well appearing and in no acute distress. Eyes: Conjunctivae are normal. PERRL. EOMI. Head: Atraumatic. Nose: No congestion/rhinnorhea. Mouth/Throat: Mucous membranes are moist.  Oropharynx non-erythematous. Cardiovascular: Normal rate, regular rhythm. Grossly normal heart sounds.  Good peripheral circulation. Respiratory: Normal respiratory effort.  No retractions. Lungs CTAB. Gastrointestinal: Soft and nontender. No distention. Positive bowel sounds Genitourinary: Deferred Musculoskeletal: No lower extremity tenderness nor edema.  Neurologic:  Normal speech and language. No gross focal neurologic deficits are appreciated.  Skin:  Skin is warm, dry and intact. No rash noted. Psychiatric: Mood and affect are normal.   ____________________________________________   LABS (all labs ordered are listed, but only abnormal results are displayed)  Labs Reviewed  COMPREHENSIVE METABOLIC PANEL - Abnormal; Notable for the following:    Total Protein 8.2 (*)    All other components within normal limits  URINALYSIS COMPLETEWITH MICROSCOPIC ([facility] ONLY) - Abnormal; Notable for  the following:    Color, Urine YELLOW (*)    APPearance CLOUDY (*)    Hgb urine dipstick 2+ (*)    Bacteria, UA RARE (*)    Squamous Epithelial / LPF 0-5 (*)    All other components within normal limits  CBC  TROPONIN I  TROPONIN I   ____________________________________________  EKG  ED  ECG REPORT I, Rebecka ApleyWebster,  Alvetta Hidrogo P, the attending physician, personally viewed and interpreted this ECG.   Date: 11/04/2014  EKG Time: 2343  Rate: 76  Rhythm: normal sinus rhythm  Axis: Normal  Intervals:none  ST&T Change: None  ____________________________________________  RADIOLOGY  CT head: Negative exam ____________________________________________   PROCEDURES  Procedure(s) performed: None  Critical Care performed: No  ____________________________________________   INITIAL IMPRESSION / ASSESSMENT AND PLAN / ED COURSE  Pertinent labs & imaging results that were available during my care of the patient were reviewed by me and considered in my medical decision making (see chart for details).  This is a 44 year old female who comes in with multiple episodes of syncope this evening. The patient's blood work currently is unremarkable and she does not show any signs of orthostasis. I will give the patient liter normal saline and reassess the patient with a repeat troponin.  The patient's CT scan was unremarkable. The patient does not have any changes in her troponin. The patient's blood work is also unremarkable. The patient is back to baseline as well. I did give the patient a liter of normal saline. Her blood pressure has also been stable. As I have not discovered the cause of her syncopal event and everything is unremarkable I will discharge the patient home to follow-up with Dr. Welton FlakesKhan for further evaluation of her syncopal event. I discussed this with the patient and her family and they do understand and agree with the plan as  stated. ____________________________________________   FINAL CLINICAL IMPRESSION(S) / ED DIAGNOSES  Final diagnoses:  Syncope and collapse  Palpitations      Rebecka ApleyAllison P Juliane Guest, MD 11/04/14 [phone]

## 2014-11-04 NOTE — Discharge Instructions (Signed)
Palpitations A palpitation is the feeling that your heartbeat is irregular or is faster than normal. It may feel like your heart is fluttering or skipping a beat. Palpitations are usually not a serious problem. However, in some cases, you may need further medical evaluation. CAUSES  Palpitations can be caused by:  Smoking.  Caffeine or other stimulants, such as diet pills or energy drinks.  Alcohol.  Stress and anxiety.  Strenuous physical activity.  Fatigue.  Certain medicines.  Heart disease, especially if you have a history of irregular heart rhythms (arrhythmias), such as atrial fibrillation, atrial flutter, or supraventricular tachycardia.  An improperly working pacemaker or defibrillator. DIAGNOSIS  To find the cause of your palpitations, your health care provider will take your medical history and perform a physical exam. Your health care provider may also have you take a test called an ambulatory electrocardiogram (ECG). An ECG records your heartbeat patterns over a 24-hour period. You may also have other tests, such as:  Transthoracic echocardiogram (TTE). During echocardiography, sound waves are used to evaluate how blood flows through your heart.  Transesophageal echocardiogram (TEE).  Cardiac monitoring. This allows your health care provider to monitor your heart rate and rhythm in real time.  Holter monitor. This is a portable device that records your heartbeat and can help diagnose heart arrhythmias. It allows your health care provider to track your heart activity for several days, if needed.  Stress tests by exercise or by giving medicine that makes the heart beat faster. TREATMENT  Treatment of palpitations depends on the cause of your symptoms and can vary greatly. Most cases of palpitations do not require any treatment other than time, relaxation, and monitoring your symptoms. Other causes, such as atrial fibrillation, atrial flutter, or supraventricular  tachycardia, usually require further treatment. HOME CARE INSTRUCTIONS   Avoid:  Caffeinated coffee, tea, soft drinks, diet pills, and energy drinks.  Chocolate.  Alcohol.  Stop smoking if you smoke.  Reduce your stress and anxiety. Things that can help you relax include:  A method of controlling things in your body, such as your heartbeats, with your mind (biofeedback).  Yoga.  Meditation.  Physical activity such as swimming, jogging, or walking.  Get plenty of rest and sleep. SEEK MEDICAL CARE IF:   You continue to have a fast or irregular heartbeat beyond 24 hours.  Your palpitations occur more often. SEEK IMMEDIATE MEDICAL CARE IF:  You have chest pain or shortness of breath.  You have a severe headache.  You feel dizzy or you faint. MAKE SURE YOU:  Understand these instructions.  Will watch your condition.  Will get help right away if you are not doing well or get worse. Document Released: 05/14/2000 Document Revised: 05/22/2013 Document Reviewed: 07/16/2011 [facility] Patient Information 2015 [place] [place]. This information is not intended to replace advice given to you by your health care provider. Make sure you discuss any questions you have with your health care provider.  Syncope Syncope is a medical term for fainting or passing out. This means you lose consciousness and drop to the ground. People are generally unconscious for less than 5 minutes. You may have some muscle twitches for up to 15 seconds before waking up and returning to normal. Syncope occurs more often in older adults, but it can happen to anyone. While most causes of syncope are not dangerous, syncope can be a sign of a serious medical problem. It is important to seek medical care.  CAUSES  Syncope is caused by  a sudden drop in blood flow to the brain. The specific cause is often not determined. Factors that can bring on syncope include:  Taking medicines that lower blood  pressure.  Sudden changes in posture, such as standing up quickly.  Taking more medicine than prescribed.  Standing in one place for too long.  Seizure disorders.  Dehydration and excessive exposure to heat.  Low blood sugar (hypoglycemia).  Straining to have a bowel movement.  Heart disease, irregular heartbeat, or other circulatory problems.  Fear, emotional distress, seeing blood, or severe pain. SYMPTOMS  Right before fainting, you may:  Feel dizzy or light-headed.  Feel nauseous.  See all white or all black in your field of vision.  Have cold, clammy skin. DIAGNOSIS  Your health care provider will ask about your symptoms, perform a physical exam, and perform an electrocardiogram (ECG) to record the electrical activity of your heart. Your health care provider may also perform other heart or blood tests to determine the cause of your syncope which may include:  Transthoracic echocardiogram (TTE). During echocardiography, sound waves are used to evaluate how blood flows through your heart.  Transesophageal echocardiogram (TEE).  Cardiac monitoring. This allows your health care provider to monitor your heart rate and rhythm in real time.  Holter monitor. This is a portable device that records your heartbeat and can help diagnose heart arrhythmias. It allows your health care provider to track your heart activity for several days, if needed.  Stress tests by exercise or by giving medicine that makes the heart beat faster. TREATMENT  In most cases, no treatment is needed. Depending on the cause of your syncope, your health care provider may recommend changing or stopping some of your medicines. HOME CARE INSTRUCTIONS  Have someone stay with you until you feel stable.  Do not drive, use machinery, or play sports until your health care provider says it is okay.  Keep all follow-up appointments as directed by your health care provider.  Lie down right away if you start  feeling like you might faint. Breathe deeply and steadily. Wait until all the symptoms have passed.  Drink enough fluids to keep your urine clear or pale yellow.  If you are taking blood pressure or heart medicine, get up slowly and take several minutes to sit and then stand. This can reduce dizziness. SEEK IMMEDIATE MEDICAL CARE IF:   You have a severe headache.  You have unusual pain in the chest, abdomen, or back.  You are bleeding from your mouth or rectum, or you have black or tarry stool.  You have an irregular or very fast heartbeat.  You have pain with breathing.  You have repeated fainting or seizure-like jerking during an episode.  You faint when sitting or lying down.  You have confusion.  You have trouble walking.  You have severe weakness.  You have vision problems. If you fainted, call your local emergency services (911 in [place] Do not drive yourself to the hospital.  MAKE SURE YOU:  Understand these instructions.  Will watch your condition.  Will get help right away if you are not doing well or get worse. Document Released: 05/17/2005 Document Revised: 05/22/2013 Document Reviewed: 07/16/2011 [facility] Patient Information 2015 [place], [place]. This information is not intended to replace advice given to you by your health care provider. Make sure you discuss any questions you have with your health care provider.

## 2014-11-04 NOTE — ED Notes (Signed)
Pt returned from CT °

## 2014-11-04 NOTE — ED Notes (Signed)
Patient transported to CT 

## 2014-11-28 ENCOUNTER — Other Ambulatory Visit
Admission: RE | Admit: 2014-11-28 | Discharge: 2014-11-28 | Disposition: A | Discharge status: Home / Self Care | Payer: 59 | Source: Ambulatory Visit | Attending: Nurse Practitioner | Admitting: Nurse Practitioner

## 2014-11-28 DIAGNOSIS — R5383 Other fatigue: Secondary | ICD-10-CM | POA: Insufficient documentation

## 2014-11-28 DIAGNOSIS — N926 Irregular menstruation, unspecified: Secondary | ICD-10-CM | POA: Diagnosis not present

## 2014-11-28 LAB — COMPREHENSIVE METABOLIC PANEL
ALT: 18 U/L (ref 14–54)
AST: 20 U/L (ref 15–41)
Albumin: 3.8 g/dL (ref 3.5–5.0)
Alkaline Phosphatase: 81 U/L (ref 38–126)
Anion gap: 7 (ref 5–15)
BILIRUBIN TOTAL: 0.8 mg/dL (ref 0.3–1.2)
BUN: 11 mg/dL (ref 6–20)
CO2: 25 mmol/L (ref 22–32)
CREATININE: 0.74 mg/dL (ref 0.44–1.00)
Calcium: 9.2 mg/dL (ref 8.9–10.3)
Chloride: 105 mmol/L (ref 101–111)
GFR calc Af Amer: >60 mL/min (ref >60)
GFR calc non Af Amer: >60 mL/min (ref >60)
Glucose, Bld: 89 mg/dL (ref 65–99)
Potassium: 4.2 mmol/L (ref 3.5–5.1)
Sodium: 137 mmol/L (ref 135–145)
Total Protein: 7.9 g/dL (ref 6.5–8.1)

## 2014-11-28 LAB — CBC
HCT: 36.5 % (ref 35.0–47.0)
Hemoglobin: 12.2 g/dL (ref 12.0–16.0)
MCH: 30.6 pg (ref 26.0–34.0)
MCHC: 33.5 g/dL (ref 32.0–36.0)
MCV: 91.3 fL (ref 80.0–100.0)
Platelets: 236 10*3/uL (ref 150–440)
RBC: 4 MIL/uL (ref 3.80–5.20)
RDW: 12.5 % (ref 11.5–14.5)
WBC: 3.4 10*3/uL — AB (ref 3.6–11.0)

## 2014-11-28 LAB — T4, FREE: Free T4: 0.79 ng/dL (ref 0.61–1.12)

## 2014-11-28 LAB — IRON AND TIBC
Iron: 53 ug/dL (ref 28–170)
SATURATION RATIOS: 11 % (ref 10.4–31.8)
TIBC: 484 ug/dL — AB (ref 250–450)
UIBC: 431 ug/dL

## 2014-11-28 LAB — TSH: TSH: 1.028 u[IU]/mL (ref 0.350–4.500)

## 2014-11-28 LAB — VITAMIN B12: Vitamin B-12: 407 pg/mL (ref 180–914)

## 2014-11-28 LAB — FOLATE: Folate: 21.2 ng/mL (ref >5.9)

## 2014-11-29 LAB — FSH/LH
FSH: 19.9 m[IU]/mL
LH: 10.5 m[IU]/mL

## 2014-11-29 LAB — T4: T4, Total: 6.7 ug/dL (ref 4.5–12.0)

## 2014-12-05 ENCOUNTER — Other Ambulatory Visit
Admission: RE | Admit: 2014-12-05 | Discharge: 2014-12-05 | Disposition: A | Discharge status: Home / Self Care | Payer: 59 | Attending: Nurse Practitioner | Admitting: Nurse Practitioner

## 2014-12-05 DIAGNOSIS — R002 Palpitations: Secondary | ICD-10-CM | POA: Diagnosis present

## 2014-12-05 LAB — OCCULT BLOOD X 1 CARD TO LAB, STOOL: Fecal Occult Bld: NEGATIVE

## 2015-01-30 ENCOUNTER — Other Ambulatory Visit: Payer: Self-pay | Admitting: Internal Medicine

## 2015-01-30 DIAGNOSIS — R0602 Shortness of breath: Secondary | ICD-10-CM

## 2015-01-30 DIAGNOSIS — R079 Chest pain, unspecified: Secondary | ICD-10-CM

## 2015-02-06 ENCOUNTER — Encounter: Admission: RE | Admit: 2015-02-06 | Payer: 59 | Source: Ambulatory Visit

## 2015-02-18 ENCOUNTER — Ambulatory Visit
Admission: RE | Admit: 2015-02-18 | Discharge: 2015-02-18 | Disposition: A | Discharge status: Home / Self Care | Payer: 59 | Source: Ambulatory Visit | Attending: Internal Medicine | Admitting: Internal Medicine

## 2015-02-18 DIAGNOSIS — R079 Chest pain, unspecified: Secondary | ICD-10-CM | POA: Diagnosis not present

## 2015-02-18 DIAGNOSIS — R0602 Shortness of breath: Secondary | ICD-10-CM | POA: Insufficient documentation

## 2015-02-18 LAB — NM MYOCAR MULTI W/SPECT W/WALL MOTION / EF
CHL CUP REST BP STRESS: 142 mmHg
CHL CUP RESTING HR STRESS: 65 {beats}/min
CHL CUP STRESS STAGE 1 HR: 62 {beats}/min
CHL CUP STRESS STAGE 2 GRADE: 0 %
CHL CUP STRESS STAGE 3 SPEED: 0 mph
CHL CUP STRESS STAGE 4 SPEED: 0 mph
CHL CUP STRESS STAGE 5 GRADE: 0 %
CHL CUP STRESS STAGE 5 HR: 120 {beats}/min
CHL CUP STRESS STAGE 6 HR: 89 {beats}/min
CSEPEW: 1 METS
CSEPPHR: 136 {beats}/min
LV dias vol: 114 mL
LVSYSVOL: 37 mL
NUC STRESS TID: 0.92
Percent of predicted max HR: 77 %
SDS: 7
SRS: 6
SSS: 13
Stage 2 HR: 93 {beats}/min
Stage 2 Speed: 0 mph
Stage 3 Grade: 0 %
Stage 3 HR: 93 {beats}/min
Stage 4 Grade: 0 %
Stage 4 HR: 136 {beats}/min
Stage 5 Speed: 0 mph
Stage 6 DBP: 79 mmHg
Stage 6 Grade: 0 %
Stage 6 SBP: 122 mmHg
Stage 6 Speed: 0 mph

## 2015-02-18 MED ORDER — TECHNETIUM TC 99M SESTAMIBI - CARDIOLITE
10.0000 | Freq: Once | INTRAVENOUS | Status: AC | PRN
  Administered 2015-02-18: 08:00:00 12.18 via INTRAVENOUS

## 2015-02-18 MED ORDER — REGADENOSON 0.4 MG/5ML IV SOLN
0.4000 mg | Freq: Once | INTRAVENOUS | Status: AC
  Administered 2015-02-18: 0.4 mg via INTRAVENOUS

## 2015-02-18 MED ORDER — TECHNETIUM TC 99M SESTAMIBI - CARDIOLITE
32.2100 | Freq: Once | INTRAVENOUS | Status: AC | PRN
  Administered 2015-02-18: 32.21 via INTRAVENOUS

## 2015-05-22 MED ORDER — LIDOCAINE HCL (PF) 1 % IJ SOLN
INTRAMUSCULAR | Status: AC
  Filled 2015-05-22: qty 30

## 2015-05-22 MED ORDER — BUPIVACAINE-EPINEPHRINE (PF) 0.25% -1:200000 IJ SOLN
INTRAMUSCULAR | Status: AC
  Filled 2015-05-22: qty 30

## 2015-05-30 NOTE — H&P (Signed)
[name] is a 44 y.o. female  With menorrhagia , long standing, . Iron deficiency requiring IV transfusion .  Ferritin last of 5 . HCT 32% Embx : proliferative o/w wnl + Pap wnl 11/ 2016 2 c/s . No vaginal deliveries. + dyspareunia ( deep thrust )  S/p L/S gastric  bypass   Past Medical History:  has a past medical history of Asthma; GERD (gastroesophageal reflux disease); History of chickenpox; History of iron deficiency anemia; HTN (hypertension); Obesity; Osteoarthritis; Sleep apnea; and Vitamin D deficiency, unspecified.  Past Surgical History:  has a past surgical history that includes Cholecystectomy; egd (11/13/2013); Cesarean section; Left finger surgery; Joint replacement (Right, 12/19/2012); Right knee manipulation 04/17/13; Hernia repair 2009; and Laparoscopic gastric bypass (11/2013). Family History: family history includes Cancer in her brother; Cervical cancer in her mother; Diabetes type II in her father and mother; Hypertension in her mother. Social History:  reports that she has never smoked. She has never used smokeless tobacco. She reports that she does not drink alcohol or use illicit drugs. OB/GYN History:  OB History    Gravida Para Term Preterm AB TAB SAB Ectopic Multiple Living   Allergies: has No Known Allergies. Medications:  Current Outpatient Prescriptions:  . acetaminophen (TYLENOL EXTRA STRENGTH) 500 MG tablet, Take 1,000 mg by mouth as needed for Pain., Disp: , Rfl:  . albuterol 90 mcg/actuation inhaler, Inhale 2 inhalations into the lungs every 6 (six) hours as needed for Wheezing., Disp: , Rfl:  . AMITIZA 24 mcg capsule, Take 1 tablet by mouth 2 (two) times daily., Disp: , Rfl: 0 . budesonide-formoterol (SYMBICORT) 160-4.5 mcg/actuation inhaler, Inhale 2 inhalations into the lungs 2 (two) times daily. , Disp: , Rfl:  . CALCIUM CARBONATE (CALCIUM 500 ORAL), Take 1 tablet by mouth 3 (three) times daily., Disp: , Rfl:  .  cholecalciferol, vitamin D3, (VITAMIN D3) 5,000 unit Tab, Take 1 tablet by mouth once daily., Disp: , Rfl:  . docusate (COLACE) 100 MG capsule, Take 100 mg by mouth 2 (two) times daily., Disp: , Rfl:  . ferrous gluconate (FERGON) 325 MG tablet, Take 75 mg by mouth 2 (two) times daily as needed., Disp: , Rfl:  . HEMOCYTE-PLUS capsule, Take 1 tablet by mouth once daily., Disp: , Rfl: 1 . ipratropium-albuterol (DUO-NEB) nebulizer solution, as needed., Disp: , Rfl: 0 . metoprolol succinate (TOPROL-XL) 50 MG XL tablet, Take 50 mg by mouth once daily. Patient unsure of dosage, Disp: , Rfl:  . montelukast (SINGULAIR) 10 mg tablet, Take 10 mg by mouth once daily as needed., Disp: , Rfl:  . multivitamin tablet, Take 1 tablet by mouth once daily., Disp: , Rfl:   Review of Systems: General:   No fatigue or weight loss Eyes:   No vision changes Ears:   No hearing difficulty Respiratory:   No cough or shortness of breath Pulmonary:   No asthma or shortness of breath Cardiovascular:  No chest pain, palpitations, dyspnea on exertion Gastrointestinal:  No abdominal bloating, chronic diarrhea, constipations, masses, pain or hematochezia Genitourinary:  No hematuria, dysuria, abnormal vaginal discharge, pelvic pain,+  Menometrorrhagia Lymphatic:  No swollen lymph nodes Musculoskeletal: No muscle weakness Neurologic:  No extremity weakness, syncope, seizure disorder Psychiatric:  No history of depression, delusions or suicidal/homicidal ideation   Exam:      Vitals:   06/03/15  BP: (!) 147/112  Pulse:     Body mass index  is 37.13 kg/(m^2).  WDWN black female in NAD  Lungs: CTA  CV : RRR without murmur   Neck: no thyromegaly Abdomen: soft , no mass, normal active bowel sounds, non-tender, no rebound tenderness Pelvic: tanner stage 5 ,  External genitalia: vulva /labia no lesions Urethra: no prolapse Vagina: normal physiologic d/c Cervix: no lesions, no cervical motion tenderness   Uterus: normal size shape and contour, non-tender Adnexa: no mass, non-tender  Saline infusion test: 20x 25 mm fundal fibroid . Saline infusion portion performed by me . No endometrial pathology .    Impression:   The primary encounter diagnosis was Menorrhagia with irregular cycle. Diagnoses of Iron deficiency anemia secondary to blood loss (chronic) and Dyspareunia in female were also pertinent to this visit.    Plan:  I have spoken with the patient regarding treatment options including expectant management, hormonal options, or surgical intervention. Offered office Her option vs OR Novasure Vs TAH + bailateral salpingectomy  After a full discussion the pt elects to proceed with TAH and bilat salpingectomy   risks of the pocedure have been discussed with the pt and she wishes to proceed.   No orders of the defined types were placed in this encounter.   Return if symptoms worsen or fail to improve, for preop.  Vilma PraderHOMAS JANSE Jessica Meenan, MD       Electronically signed by Vilma Praderhomas Janse Jessica Detter, MD at 05/07/2015 11:43 AM      Office Visit on 05/07/2015      Department  Name Address Phone Fax  [facility] [address] [place] [id number] [phone]  Service Location  Name Address      [facility] [address] [place] [address]

## 2015-06-03 ENCOUNTER — Encounter
Admission: RE | Admit: 2015-06-03 | Discharge: 2015-06-03 | Disposition: A | Discharge status: Home / Self Care | Payer: 59 | Source: Ambulatory Visit | Attending: Obstetrics and Gynecology | Admitting: Obstetrics and Gynecology

## 2015-06-03 DIAGNOSIS — Z01812 Encounter for preprocedural laboratory examination: Secondary | ICD-10-CM | POA: Diagnosis present

## 2015-06-03 HISTORY — DX: Other complications of anesthesia, initial encounter: T88.59XA

## 2015-06-03 HISTORY — DX: Adverse effect of unspecified anesthetic, initial encounter: T41.45XA

## 2015-06-03 LAB — RAPID HIV SCREEN (HIV 1/2 AB+AG)
HIV 1/2 ANTIBODIES: NONREACTIVE
HIV-1 P24 ANTIGEN - HIV24: NONREACTIVE

## 2015-06-03 LAB — CBC
HEMATOCRIT: 32.7 % — AB (ref 35.0–47.0)
HEMOGLOBIN: 11 g/dL — AB (ref 12.0–16.0)
MCH: 28.4 pg (ref 26.0–34.0)
MCHC: 33.6 g/dL (ref 32.0–36.0)
MCV: 84.5 fL (ref 80.0–100.0)
Platelets: 246 10*3/uL (ref 150–440)
RBC: 3.86 MIL/uL (ref 3.80–5.20)
RDW: 13.9 % (ref 11.5–14.5)
WBC: 4.1 10*3/uL (ref 3.6–11.0)

## 2015-06-03 LAB — BASIC METABOLIC PANEL
ANION GAP: 6 (ref 5–15)
BUN: 9 mg/dL (ref 6–20)
CALCIUM: 9.2 mg/dL (ref 8.9–10.3)
CO2: 28 mmol/L (ref 22–32)
Chloride: 105 mmol/L (ref 101–111)
Creatinine, Ser: 0.56 mg/dL (ref 0.44–1.00)
GFR calc Af Amer: >60 mL/min (ref >60)
GLUCOSE: 91 mg/dL (ref 65–99)
POTASSIUM: 4.2 mmol/L (ref 3.5–5.1)
SODIUM: 139 mmol/L (ref 135–145)

## 2015-06-03 LAB — TYPE AND SCREEN
ABO/RH(D): A POS
Antibody Screen: NEGATIVE

## 2015-06-03 LAB — ABO/RH: ABO/RH(D): A POS

## 2015-06-03 NOTE — Patient Instructions (Signed)
  Your procedure is scheduled on: Monday 06/09/2015 Report to Day Surgery. 2ND FLOOR MEDICAL MALL ENTRANCE To find out your arrival time please call [phone] between 1PM - 3PM on Friday 06/06/2015.  Remember: Instructions that are not followed completely may result in serious medical risk, up to and including death, or upon the discretion of your surgeon and anesthesiologist your surgery may need to be rescheduled.    __X__ 1. Do not eat food or drink liquids after midnight. No gum chewing or hard candies.     __X__ 2. No Alcohol for 24 hours before or after surgery.   ____ 3. Bring all medications with you on the day of surgery if instructed.    __X__ 4. Notify your doctor if there is any change in your medical condition     (cold, fever, infections).     Do not wear jewelry, make-up, hairpins, clips or nail polish.  Do not wear lotions, powders, or perfumes. .  Do not shave 48 hours prior to surgery. Men may shave face and neck.  Do not bring valuables to the hospital.    [facility] is not responsible for any belongings or valuables.               Contacts, dentures or bridgework may not be worn into surgery.  Leave your suitcase in the car. After surgery it may be brought to your room.  For patients admitted to the hospital, discharge time is determined by your                treatment team.   Patients discharged the day of surgery will not be allowed to drive home.   Please read over the following fact sheets that you were given:   Surgical Site Infection Prevention   __X__ Take these medicines the morning of surgery with A SIP OF WATER:   1. montelukast (SINGULAIR) 10 MG tablet    metoprolol (LOPRESSOR) 50 MG tablet   2.   3.   4.  5.  6.  ____ Fleet Enema (as directed)   __X_ Use CHG Soap as directed  ____ Use inhalers on the day of surgery  ____ Stop metformin 2 days prior to surgery    ____ Take 1/2 of usual insulin dose the night before surgery and none on  the morning of surgery.   ____ Stop Coumadin/Plavix/aspirin on   ____ Stop Anti-inflammatories on    __X__ Stop supplements until after surgery.    __X__ Bring C-Pap to the hospital.

## 2015-06-09 ENCOUNTER — Ambulatory Visit: Payer: 59 | Admitting: Anesthesiology

## 2015-06-09 ENCOUNTER — Encounter: Payer: Self-pay | Admitting: *Deleted

## 2015-06-09 ENCOUNTER — Inpatient Hospital Stay
Admission: RE | Admit: 2015-06-09 | Discharge: 2015-06-12 | DRG: 743 | Disposition: A | Discharge status: Home / Self Care | Payer: 59 | Source: Ambulatory Visit | Attending: Obstetrics and Gynecology | Admitting: Obstetrics and Gynecology

## 2015-06-09 ENCOUNTER — Encounter
Admission: RE | Disposition: A | Discharge status: Home / Self Care | Payer: Self-pay | Source: Ambulatory Visit | Attending: Obstetrics and Gynecology

## 2015-06-09 DIAGNOSIS — Z9884 Bariatric surgery status: Secondary | ICD-10-CM

## 2015-06-09 DIAGNOSIS — Z8619 Personal history of other infectious and parasitic diseases: Secondary | ICD-10-CM | POA: Diagnosis not present

## 2015-06-09 DIAGNOSIS — N920 Excessive and frequent menstruation with regular cycle: Secondary | ICD-10-CM | POA: Diagnosis present

## 2015-06-09 DIAGNOSIS — M1990 Unspecified osteoarthritis, unspecified site: Secondary | ICD-10-CM | POA: Diagnosis present

## 2015-06-09 DIAGNOSIS — Z808 Family history of malignant neoplasm of other organs or systems: Secondary | ICD-10-CM

## 2015-06-09 DIAGNOSIS — I10 Essential (primary) hypertension: Secondary | ICD-10-CM | POA: Diagnosis present

## 2015-06-09 DIAGNOSIS — G4730 Sleep apnea, unspecified: Secondary | ICD-10-CM | POA: Diagnosis present

## 2015-06-09 DIAGNOSIS — K219 Gastro-esophageal reflux disease without esophagitis: Secondary | ICD-10-CM | POA: Diagnosis present

## 2015-06-09 DIAGNOSIS — Z833 Family history of diabetes mellitus: Secondary | ICD-10-CM | POA: Diagnosis not present

## 2015-06-09 DIAGNOSIS — E559 Vitamin D deficiency, unspecified: Secondary | ICD-10-CM | POA: Diagnosis present

## 2015-06-09 DIAGNOSIS — Z8249 Family history of ischemic heart disease and other diseases of the circulatory system: Secondary | ICD-10-CM

## 2015-06-09 DIAGNOSIS — N941 Unspecified dyspareunia: Secondary | ICD-10-CM | POA: Diagnosis present

## 2015-06-09 DIAGNOSIS — Z9889 Other specified postprocedural states: Secondary | ICD-10-CM

## 2015-06-09 DIAGNOSIS — Z8049 Family history of malignant neoplasm of other genital organs: Secondary | ICD-10-CM

## 2015-06-09 DIAGNOSIS — D500 Iron deficiency anemia secondary to blood loss (chronic): Secondary | ICD-10-CM | POA: Diagnosis present

## 2015-06-09 DIAGNOSIS — J45909 Unspecified asthma, uncomplicated: Secondary | ICD-10-CM | POA: Diagnosis present

## 2015-06-09 DIAGNOSIS — Z9049 Acquired absence of other specified parts of digestive tract: Secondary | ICD-10-CM | POA: Diagnosis not present

## 2015-06-09 HISTORY — PX: ABDOMINAL HYSTERECTOMY: SHX81

## 2015-06-09 LAB — GLUCOSE, CAPILLARY: GLUCOSE-CAPILLARY: 133 mg/dL — AB (ref 65–99)

## 2015-06-09 LAB — POCT PREGNANCY, URINE: PREG TEST UR: NEGATIVE

## 2015-06-09 SURGERY — HYSTERECTOMY, ABDOMINAL
Anesthesia: General | Wound class: Clean Contaminated

## 2015-06-09 MED ORDER — DIPHENHYDRAMINE HCL 12.5 MG/5ML PO ELIX: 12.5000 mg | ORAL_SOLUTION | Freq: Four times a day (QID) | ORAL | Status: DC | PRN

## 2015-06-09 MED ORDER — ONDANSETRON HCL 4 MG/2ML IJ SOLN: 4.0000 mg | Freq: Four times a day (QID) | INTRAMUSCULAR | Status: DC | PRN

## 2015-06-09 MED ORDER — FENTANYL CITRATE (PF) 100 MCG/2ML IJ SOLN
25.0000 ug | INTRAMUSCULAR | Status: AC | PRN
  Administered 2015-06-09 (×6): 25 ug via INTRAVENOUS

## 2015-06-09 MED ORDER — ONDANSETRON HCL 4 MG/2ML IJ SOLN
INTRAMUSCULAR | Status: DC | PRN
  Administered 2015-06-09: 4 mg via INTRAVENOUS

## 2015-06-09 MED ORDER — DEXAMETHASONE SODIUM PHOSPHATE 10 MG/ML IJ SOLN
INTRAMUSCULAR | Status: DC | PRN
  Administered 2015-06-09: 10 mg via INTRAVENOUS

## 2015-06-09 MED ORDER — FAMOTIDINE 20 MG PO TABS: 20.0000 mg | ORAL_TABLET | Freq: Once | ORAL | Status: DC

## 2015-06-09 MED ORDER — LACTATED RINGERS IV SOLN
INTRAVENOUS | Status: DC
Start: 2015-06-09 — End: 2015-06-10
  Administered 2015-06-09 – 2015-06-10 (×3): via INTRAVENOUS

## 2015-06-09 MED ORDER — KETOROLAC TROMETHAMINE 30 MG/ML IJ SOLN
30.0000 mg | Freq: Three times a day (TID) | INTRAMUSCULAR | Status: DC | PRN
  Administered 2015-06-09: 30 mg via INTRAVENOUS
  Filled 2015-06-09: qty 1

## 2015-06-09 MED ORDER — ONDANSETRON HCL 4 MG/2ML IJ SOLN: 4.0000 mg | Freq: Once | INTRAMUSCULAR | Status: DC | PRN

## 2015-06-09 MED ORDER — NALOXONE HCL 0.4 MG/ML IJ SOLN: 0.4000 mg | INTRAMUSCULAR | Status: DC | PRN

## 2015-06-09 MED ORDER — LIDOCAINE HCL (CARDIAC) 20 MG/ML IV SOLN
INTRAVENOUS | Status: DC | PRN
  Administered 2015-06-09: 30 mg via INTRAVENOUS

## 2015-06-09 MED ORDER — ONDANSETRON HCL 4 MG PO TABS
4.0000 mg | ORAL_TABLET | Freq: Four times a day (QID) | ORAL | Status: DC | PRN
  Administered 2015-06-11 (×2): 4 mg via ORAL
  Filled 2015-06-09 (×2): qty 1

## 2015-06-09 MED ORDER — LACTATED RINGERS IV SOLN
INTRAVENOUS | Status: DC
  Administered 2015-06-09: 1000 mL via INTRAVENOUS
  Administered 2015-06-09: 08:00:00 via INTRAVENOUS

## 2015-06-09 MED ORDER — FENTANYL CITRATE (PF) 100 MCG/2ML IJ SOLN
INTRAMUSCULAR | Status: AC
  Filled 2015-06-09: qty 2

## 2015-06-09 MED ORDER — PROPOFOL 10 MG/ML IV BOLUS
INTRAVENOUS | Status: DC | PRN
  Administered 2015-06-09: 130 mg via INTRAVENOUS

## 2015-06-09 MED ORDER — IPRATROPIUM-ALBUTEROL 0.5-2.5 (3) MG/3ML IN SOLN
3.0000 mL | RESPIRATORY_TRACT | Status: DC | PRN
  Filled 2015-06-09: qty 3

## 2015-06-09 MED ORDER — METOPROLOL TARTRATE 50 MG PO TABS
50.0000 mg | ORAL_TABLET | Freq: Every day | ORAL | Status: DC
  Administered 2015-06-10 – 2015-06-12 (×3): 50 mg via ORAL
  Filled 2015-06-09 (×3): qty 1

## 2015-06-09 MED ORDER — LACTATED RINGERS IV SOLN
INTRAVENOUS | Status: DC
  Administered 2015-06-09: 07:00:00 via INTRAVENOUS

## 2015-06-09 MED ORDER — FENTANYL CITRATE (PF) 100 MCG/2ML IJ SOLN
INTRAMUSCULAR | Status: DC | PRN
  Administered 2015-06-09 (×2): 50 ug via INTRAVENOUS
  Administered 2015-06-09: 100 ug via INTRAVENOUS
  Administered 2015-06-09: 50 ug via INTRAVENOUS

## 2015-06-09 MED ORDER — DOCUSATE SODIUM 100 MG PO CAPS
100.0000 mg | ORAL_CAPSULE | Freq: Two times a day (BID) | ORAL | Status: DC
  Administered 2015-06-09 – 2015-06-12 (×6): 100 mg via ORAL
  Filled 2015-06-09 (×6): qty 1

## 2015-06-09 MED ORDER — DIPHENHYDRAMINE HCL 50 MG/ML IJ SOLN: 12.5000 mg | Freq: Four times a day (QID) | INTRAMUSCULAR | Status: DC | PRN

## 2015-06-09 MED ORDER — FAMOTIDINE 20 MG PO TABS
ORAL_TABLET | ORAL | Status: AC
  Filled 2015-06-09: qty 1

## 2015-06-09 MED ORDER — MIDAZOLAM HCL 2 MG/2ML IJ SOLN
INTRAMUSCULAR | Status: DC | PRN
  Administered 2015-06-09: 2 mg via INTRAVENOUS

## 2015-06-09 MED ORDER — MONTELUKAST SODIUM 10 MG PO TABS
10.0000 mg | ORAL_TABLET | Freq: Every day | ORAL | Status: DC
  Administered 2015-06-10 – 2015-06-12 (×3): 10 mg via ORAL
  Filled 2015-06-09 (×3): qty 1

## 2015-06-09 MED ORDER — ROCURONIUM BROMIDE 100 MG/10ML IV SOLN
INTRAVENOUS | Status: DC | PRN
  Administered 2015-06-09 (×2): 10 mg via INTRAVENOUS
  Administered 2015-06-09: 40 mg via INTRAVENOUS
  Administered 2015-06-09: 10 mg via INTRAVENOUS

## 2015-06-09 MED ORDER — MORPHINE SULFATE 2 MG/ML IV SOLN
INTRAVENOUS | Status: DC
  Administered 2015-06-09: 9 mg via INTRAVENOUS
  Administered 2015-06-09: 12:00:00 via INTRAVENOUS
  Filled 2015-06-09: qty 25

## 2015-06-09 MED ORDER — CEFOXITIN SODIUM-DEXTROSE 2-2.2 GM-% IV SOLR (PREMIX)
2.0000 g | INTRAVENOUS | Status: AC
  Administered 2015-06-09: 2000 mg via INTRAVENOUS

## 2015-06-09 MED ORDER — SODIUM CHLORIDE 0.9 % IJ SOLN: 9.0000 mL | INTRAMUSCULAR | Status: DC | PRN

## 2015-06-09 MED ORDER — CEFOXITIN SODIUM-DEXTROSE 2-2.2 GM-% IV SOLR (PREMIX)
INTRAVENOUS | Status: AC
  Administered 2015-06-09: 2000 mg via INTRAVENOUS
  Filled 2015-06-09: qty 50

## 2015-06-09 MED ORDER — ALBUTEROL SULFATE HFA 108 (90 BASE) MCG/ACT IN AERS: 2.0000 | INHALATION_SPRAY | Freq: Four times a day (QID) | RESPIRATORY_TRACT | Status: DC | PRN

## 2015-06-09 MED ORDER — SUGAMMADEX SODIUM 200 MG/2ML IV SOLN
INTRAVENOUS | Status: DC | PRN
  Administered 2015-06-09: 185 mg via INTRAVENOUS

## 2015-06-09 MED ORDER — ALBUTEROL SULFATE HFA 108 (90 BASE) MCG/ACT IN AERS
INHALATION_SPRAY | RESPIRATORY_TRACT | Status: DC | PRN
Start: 2015-06-09 — End: 2015-06-09
  Administered 2015-06-09: 6 via RESPIRATORY_TRACT

## 2015-06-09 SURGICAL SUPPLY — 41 items
CANISTER SUCT 1200ML W/VALVE (MISCELLANEOUS) ×3 IMPLANT
CATH TRAY 16F METER LATEX (MISCELLANEOUS) ×3 IMPLANT
CHLORAPREP W/TINT 26ML (MISCELLANEOUS) ×3 IMPLANT
DISSECTOR KITTNER STICK (MISCELLANEOUS) IMPLANT
DISSECTORS/KITTNER STICK (MISCELLANEOUS)
DRAPE LAP W/FLUID (DRAPES) ×3 IMPLANT
DRAPE LAPAROTOMY TRNSV 106X77 (MISCELLANEOUS) ×3 IMPLANT
DRAPE UNDER BUTTOCK W/FLU (DRAPES) ×3 IMPLANT
DRSG TELFA 3X8 NADH (GAUZE/BANDAGES/DRESSINGS) ×3 IMPLANT
ELECT BLADE 6 FLAT ULTRCLN (ELECTRODE) ×3 IMPLANT
ELECT CAUTERY BLADE 6.4 (BLADE) ×3 IMPLANT
GAUZE SPONGE 4X4 12PLY STRL (GAUZE/BANDAGES/DRESSINGS) ×3 IMPLANT
GLOVE BIO SURGEON STRL SZ8 (GLOVE) ×6 IMPLANT
GLOVE INDICATOR 8.0 STRL GRN (GLOVE) IMPLANT
GOWN STRL REUS W/ TWL LRG LVL3 (GOWN DISPOSABLE) ×4 IMPLANT
GOWN STRL REUS W/ TWL XL LVL3 (GOWN DISPOSABLE) ×4 IMPLANT
GOWN STRL REUS W/TWL LRG LVL3 (GOWN DISPOSABLE) ×2
GOWN STRL REUS W/TWL XL LVL3 (GOWN DISPOSABLE) ×2
IV NS 1000ML (IV SOLUTION) ×1
IV NS 1000ML BAXH (IV SOLUTION) ×2 IMPLANT
KIT RM TURNOVER CYSTO AR (KITS) ×3 IMPLANT
LABEL OR SOLS (LABEL) ×3 IMPLANT
PACK BASIN MAJOR ARMC (MISCELLANEOUS) ×3 IMPLANT
PAD GROUND ADULT SPLIT (MISCELLANEOUS) ×3 IMPLANT
RETAINER VISCERA MED (MISCELLANEOUS) IMPLANT
SOL PREP PVP 2OZ (MISCELLANEOUS) ×3
SOLUTION PREP PVP 2OZ (MISCELLANEOUS) ×2 IMPLANT
SPONGE XRAY 4X4 16PLY STRL (MISCELLANEOUS) IMPLANT
STAPLER SKIN PROX 35W (STAPLE) ×3 IMPLANT
SURGILUBE 2OZ TUBE FLIPTOP (MISCELLANEOUS) ×3 IMPLANT
SUT VIC AB 0 CT1 27 (SUTURE) ×3
SUT VIC AB 0 CT1 27XCR 8 STRN (SUTURE) ×6 IMPLANT
SUT VIC AB 0 CT1 36 (SUTURE) ×12 IMPLANT
SUT VIC AB 2-0 CT1 (SUTURE) ×3 IMPLANT
SUT VIC AB 2-0 SH 27 (SUTURE) ×4
SUT VIC AB 2-0 SH 27XBRD (SUTURE) ×8 IMPLANT
SUT VICRYL PLUS ABS 0 54 (SUTURE) ×3 IMPLANT
SYR BULB IRRIG 60ML STRL (SYRINGE) ×3 IMPLANT
SYRINGE 10CC LL (SYRINGE) ×3 IMPLANT
TRAY PREP VAG/GEN (MISCELLANEOUS) ×3 IMPLANT
WATER STERILE IRR 1000ML POUR (IV SOLUTION) ×3 IMPLANT

## 2015-06-09 NOTE — Anesthesia Postprocedure Evaluation (Signed)
Anesthesia Post Note  Patient: [name]  Procedure(s) Performed: Procedure(s) (LRB): HYSTERECTOMY ABDOMINAL with Bilateral Salpingectomy (N/A)  Patient location during evaluation: PACU Anesthesia Type: General Level of consciousness: awake Pain management: pain level controlled Vital Signs Assessment: post-procedure vital signs reviewed and stable Cardiovascular status: blood pressure returned to baseline    Last Vitals:  Filed Vitals:   06/09/15 1131 06/09/15 1230  BP:  102/61  Pulse:  69  Temp:  36.8 C  Resp: 18 16    Last Pain:  Filed Vitals:   06/09/15 1230  PainSc: 10-Worst pain ever                 Shauntae Reitman S

## 2015-06-09 NOTE — Transfer of Care (Signed)
Immediate Anesthesia Transfer of Care Note  Patient: [name]  Procedure(s) Performed: Procedure(s): HYSTERECTOMY ABDOMINAL with Bilateral Salpingectomy (N/A)  Patient Location: PACU  Anesthesia Type:General  Level of Consciousness: awake, alert  and oriented  Airway & Oxygen Therapy: Patient Spontanous Breathing and Patient connected to face mask oxygen  Post-op Assessment: Report given to RN and Post -op Vital signs reviewed and stable  Post vital signs: Reviewed and stable  Last Vitals:  Filed Vitals:   06/09/15 1008 06/09/15 1012  BP: 131/85 131/85  Pulse: 75   Temp: 36.4 C 36.5 C  Resp: 20     Complications: No apparent anesthesia complications

## 2015-06-09 NOTE — Progress Notes (Signed)
Patient ID: [name], female   DOB: 01/13/1971, 45 y.o.   MRN: [id number] DOS . TAH  + bilat salpingectomy . PAin good control with PCA + toradol . Urine output ok . VSS  A; stable  P; cont care  Cbc in am

## 2015-06-09 NOTE — Anesthesia Preprocedure Evaluation (Addendum)
Anesthesia Evaluation  Patient identified by MRN, date of birth, ID band Patient awake    Reviewed: Allergy & Precautions, NPO status , Patient's Chart, lab work & pertinent test results, reviewed documented beta blocker date and time   History of Anesthesia Complications (+) history of anesthetic complications  Airway Mallampati: III  TM Distance: >3 FB     Dental  (+) Chipped, Poor Dentition, Dental Advisory Given   Pulmonary asthma , sleep apnea ,           Cardiovascular hypertension, Pt. on medications and Pt. on home beta blockers      Neuro/Psych    GI/Hepatic   Endo/Other    Renal/GU      Musculoskeletal  (+) Arthritis ,   Abdominal   Peds  Hematology  (+) anemia ,   Anesthesia Other Findings Obese. Sleep apnea - will use CPAP tonite. Used nebulizer this am. Gastric bypass RouxenY. Severe overbite - will use Mcgrath.  Reproductive/Obstetrics                           Anesthesia Physical Anesthesia Plan  ASA: III  Anesthesia Plan: General   Post-op Pain Management:    Induction: Intravenous  Airway Management Planned: Oral ETT and Video Laryngoscope Planned  Additional Equipment:   Intra-op Plan:   Post-operative Plan:   Informed Consent: I have reviewed the patients History and Physical, chart, labs and discussed the procedure including the risks, benefits and alternatives for the proposed anesthesia with the patient or authorized representative who has indicated his/her understanding and acceptance.     Plan Discussed with: CRNA  Anesthesia Plan Comments:        Anesthesia Quick Evaluation

## 2015-06-09 NOTE — Brief Op Note (Signed)
06/09/2015  9:54 AM  PATIENT:  [name]  45 y.o. female  PRE-OPERATIVE DIAGNOSIS:  menorrhagia,anemia  POST-OPERATIVE DIAGNOSIS:  menorrhagia,anemia  PROCEDURE:  Procedure(s): HYSTERECTOMY ABDOMINAL with Bilateral Salpingectomy (N/A)  SURGEON:  Surgeon(s) and Role:    * Suzy Bouchardhomas J Raymund Manrique, MD - Primary    * Christeen DouglasBethany Beasley, MD - Assisting  PHYSICIAN ASSISTANT: fisher , jarred  Pa student  ASSISTANTS: none   ANESTHESIA:   general  EBL:  Total I/O In: 800 [I.V.:800] Out: 250 [Urine:150; Blood:100]  BLOOD ADMINISTERED:none  DRAINS: Urinary Catheter (Foley)   LOCAL MEDICATIONS USED:  NONE  SPECIMEN:  Source of Specimen:  uterus , tubes , cervix  DISPOSITION OF SPECIMEN:  PATHOLOGY  COUNTS:  YES  TOURNIQUET:  * No tourniquets in log *  DICTATION: .Other Dictation: Dictation Number verbal   PLAN OF CARE: Admit to inpatient   PATIENT DISPOSITION:  PACU - hemodynamically stable.   Delay start of Pharmacological VTE agent (>24hrs) due to surgical blood loss or risk of bleeding: not applicable

## 2015-06-09 NOTE — Progress Notes (Signed)
Pt ready for TAH , bilateral salpingectomy . LAbs reviewed HCT 32 % . NPO . Metoprolol , singulair and proventil taken today . All questions answered

## 2015-06-09 NOTE — OR Nursing (Signed)
Patient has very small veins so #22 gauge IV started in the left forearm runs well.

## 2015-06-09 NOTE — Anesthesia Procedure Notes (Signed)
Procedure Name: Intubation Date/Time: 06/09/2015 7:48 AM Performed by: Omer JackWEATHERLY, Laydon Martis Pre-anesthesia Checklist: Patient identified, Patient being monitored, Timeout performed, Emergency Drugs available and Suction available Patient Re-evaluated:Patient Re-evaluated prior to inductionOxygen Delivery Method: Circle system utilized Preoxygenation: Pre-oxygenation with 100% oxygen Intubation Type: IV induction Ventilation: Mask ventilation without difficulty Laryngoscope Size: 3 and McGraph Grade View: Grade I Tube type: Oral Tube size: 7.0 mm Number of attempts: 1 Placement Confirmation: ETT inserted through vocal cords under direct vision,  positive ETCO2 and breath sounds checked- equal and bilateral Secured at: 21 cm Tube secured with: Tape Dental Injury: Teeth and Oropharynx as per pre-operative assessment

## 2015-06-10 ENCOUNTER — Encounter: Payer: Self-pay | Admitting: Obstetrics and Gynecology

## 2015-06-10 LAB — CBC
HEMATOCRIT: 29.8 % — AB (ref 35.0–47.0)
HEMOGLOBIN: 9.6 g/dL — AB (ref 12.0–16.0)
MCH: 27 pg (ref 26.0–34.0)
MCHC: 32.3 g/dL (ref 32.0–36.0)
MCV: 83.8 fL (ref 80.0–100.0)
Platelets: 202 10*3/uL (ref 150–440)
RBC: 3.55 MIL/uL — ABNORMAL LOW (ref 3.80–5.20)
RDW: 14 % (ref 11.5–14.5)
WBC: 8.3 10*3/uL (ref 3.6–11.0)

## 2015-06-10 LAB — BASIC METABOLIC PANEL
ANION GAP: 4 — AB (ref 5–15)
BUN: 12 mg/dL (ref 6–20)
CO2: 30 mmol/L (ref 22–32)
Calcium: 8.8 mg/dL — ABNORMAL LOW (ref 8.9–10.3)
Chloride: 104 mmol/L (ref 101–111)
Creatinine, Ser: 0.6 mg/dL (ref 0.44–1.00)
GFR calc Af Amer: >60 mL/min (ref >60)
GFR calc non Af Amer: >60 mL/min (ref >60)
GLUCOSE: 97 mg/dL (ref 65–99)
POTASSIUM: 4.2 mmol/L (ref 3.5–5.1)
Sodium: 138 mmol/L (ref 135–145)

## 2015-06-10 LAB — SURGICAL PATHOLOGY

## 2015-06-10 MED ORDER — PROMETHAZINE HCL 25 MG PO TABS: 25.0000 mg | ORAL_TABLET | Freq: Four times a day (QID) | ORAL | Status: DC | PRN

## 2015-06-10 MED ORDER — OXYCODONE-ACETAMINOPHEN 5-325 MG PO TABS
1.0000 | ORAL_TABLET | ORAL | Status: DC | PRN
  Administered 2015-06-10: 1 via ORAL
  Administered 2015-06-10 – 2015-06-12 (×7): 2 via ORAL
  Filled 2015-06-10 (×8): qty 2

## 2015-06-10 MED ORDER — DOCUSATE SODIUM 100 MG PO CAPS: 100.0000 mg | ORAL_CAPSULE | Freq: Two times a day (BID) | ORAL | Status: DC | PRN

## 2015-06-10 MED ORDER — NAPROXEN 500 MG PO TABS
500.0000 mg | ORAL_TABLET | Freq: Two times a day (BID) | ORAL | Status: DC
  Filled 2015-06-10 (×2): qty 1

## 2015-06-10 NOTE — Progress Notes (Signed)
1 Day Post-Op Procedure(s) (LRB): HYSTERECTOMY ABDOMINAL with Bilateral Salpingectomy (N/A)  Subjective: Patient reports some pain , but overall doing well.    Objective: I have reviewed patient's vital signs.afebrile    Resp: clear to auscultation bilaterally Cardio: regular rate and rhythm, S1, S2 normal, no murmur, click, rub or gallop GI: soft, non-tender; bowel sounds normal; no masses,  no organomegaly Vaginal Bleeding: none  good urine output Assessment: s/p Procedure(s): HYSTERECTOMY ABDOMINAL with Bilateral Salpingectomy (N/A): stable  Plan: Advance diet  D/C foley + IVF Po pain meds    LOS: 1 day    Autym Siess 06/10/2015, 9:02 AM

## 2015-06-10 NOTE — Op Note (Signed)
NAMTilden Fossa:  [name], [name]               ACCOUNT NO.:  [id number]  MEDICAL RECORD NO.:  [id number]  LOCATION:  348A                         FACILITY:  [facility]  PHYSICIAN:  Jennell Cornerhomas Kenli Waldo, MDDATE OF BIRTH:  05-02-1971  DATE OF PROCEDURE: DATE OF DISCHARGE:                              OPERATIVE REPORT   PREOPERATIVE DIAGNOSIS: 1. Menorrhagia. 2. Severe anemia. 3. Dyspareunia.  POSTOPERATIVE DIAGNOSIS: 1. Menorrhagia. 2. Severe anemia. 3. Dyspareunia.  PROCEDURE PERFORMED: 1. Total abdominal hysterectomy. 2. Bilateral salpingectomy.  SURGEON:  Jennell Cornerhomas Hazely Sealey, MD  ANESTHESIA:  General endotracheal anesthesia.  SURGEON:  Jennell Cornerhomas Makiyla Linch, MD  FIRST ASSISTANT:  Dalbert GarnetBeasley.  ASSISTANT:  PA Radio producerstudent Fisher.  INDICATIONS:  A 45 year old, gravida 2, para 2 patient with a long history of menorrhagia.  The patient required IV transfusions to help with severe anemia.  The patient is status post 2 prior cesarean section.  The patient also has significant deep thrust dyspareunia.  DESCRIPTION OF PROCEDURE:  After general endotracheal anesthesia, the patient was placed in dorsal supine position with the legs in the [place] stirrups.  The patient's abdomen, perineum and vagina were prepped and draped in normal sterile fashion and a Foley catheter was placed.  The patient did receive 2 g IV cefoxitin prior to commencement of the case. Time-out was performed.  A Pfannenstiel incision was made 2 fingerbreadths above the symphysis pubis.  Sharp dissection was used to identify the fascia.  Fascia was opened in a transverse fashion.  The superior aspect of the fascia was grasped with Kocher clamps and the recti muscles dissected free.  The inferior aspect of the fascia was grasped with Kocher clamps and pyramidalis muscle was dissected free. Entry into the peritoneal cavity was accomplished sharply.  An Lenox AhrO'Connor O'Sullivan retractor was placed into the patient's abdomen and the  bowel was packed cephalad.  Two large Kelly clamps were placed on the uterine cornua used for retraction.  The round ligaments on both sides were clamped, transected, suture ligated with 0 Vicryl suture.  The anterior leaf of the broad ligament was incised along the bladder reflection in the midline on both sides.  The bladder was gently dissected off the lower uterine segment with sharp and blunt dissection.  Window was made in each broad ligament and Heaney Ballentine clamps were used to clamp the uterine ovarian ligament and the fallopian tube.  The pedicle was transected, suture ligated with 0 Vicryl suture.  Similar procedure was repeated on the opposite side.  Uterine arteries were then skeletonized and bilaterally clamped with Heaney clamps, transected, and suture ligated with 0 Vicryl suture.  Uterosacral ligaments were clamped on both sides, transected, and suture ligated with 0 Vicryl suture.  The vaginal cuff angles were clamped with curved Heaney Ballentine and the cervix and uterus were removed without difficulty.  Vaginal cuff was then closed with interrupted figure-of-eight 0 Vicryl suture.  Good hemostasis was noted.  Each fallopian tube was then grasped with a Babcock clamp and the clamp was placed through the mesosalpinx and each fallopian tubes removed.  Each pedicle was closed and tied with 0 Vicryl suture.  Good hemostasis noted.  Normal peristaltic activity was identified from each ureter.  The  patient's abdomen was then copiously irrigated and suctioned.  Good hemostasis was noted.  All sutures were then trimmed.  The Andrey Spearman retractor was removed.  All laparotomy sponges were removed.  The fascia was then closed with 0 Vicryl suture in a running nonlocking fashion.  Good approximation of tissues.  Subcutaneous tissues were irrigated and bovied for hemostasis and the skin was reapproximated with staples.  There were no complications.  ESTIMATED BLOOD  LOSS:  100 mL.  INTRAOPERATIVE FLUIDS:  800 mL.  URINE OUTPUT:  150 mL.  The patient was taken to recovery room in good condition.          ______________________________ Jennell Corner, MD     TS/MEDQ  D:  06/09/2015  T:  06/10/2015  Job:  [id number]

## 2015-06-10 NOTE — Progress Notes (Signed)
Pt stated she "felt funny" & noted low blood sugars since her recent gastric bypass. Pt stated these low blood sugars came on suddenly and had caused her to faint several times in the past. Blood sugar was checked upton pt request to determine if pt was suffering from a low blood sugar. BS 133 at this time.

## 2015-06-11 NOTE — Progress Notes (Signed)
2 Days Post-Op Procedure(s) (LRB): HYSTERECTOMY ABDOMINAL with Bilateral Salpingectomy (N/A)  Subjective: Patient reports nausea.   + flatus and BM  Objective: I have reviewed patient's vital signs.  General: alert and cooperative Resp: clear to auscultation bilaterally Cardio: regular rate and rhythm, S1, S2 normal, no murmur, click, rub or gallop GI: soft, non-tender; bowel sounds normal; no masses,  no organomegaly Incision C/D/I Assessment: s/p Procedure(s): HYSTERECTOMY ABDOMINAL with Bilateral Salpingectomy (N/A): stable  Plan: Advance diet  LOS: 2 days    [name] 06/11/2015, 9:20 AM

## 2015-06-12 MED ORDER — DOCUSATE SODIUM 100 MG PO CAPS: 100.0000 mg | ORAL_CAPSULE | Freq: Two times a day (BID) | ORAL | Status: DC

## 2015-06-12 MED ORDER — OXYCODONE-ACETAMINOPHEN 5-325 MG PO TABS: 1.0000 | ORAL_TABLET | ORAL | Status: DC | PRN

## 2015-06-12 NOTE — Discharge Summary (Signed)
Physician Discharge Summary  Patient ID: [name] MRN: [id number] DOB/AGE: 46/01/1971 46 y.o.  Admit date: 06/09/2015 Discharge date: 06/12/2015  Admission Diagnoses:menorrhagia   Discharge Diagnoses: same Active Problems:   Postoperative state   Discharged Condition: good  Hospital Course: pt underwent an uncomplicated TAH , bilateral salpingectomy. Post operatively did well . Post op hct 29.8% , bun / cr 12/0.5  Consults: None  Significant Diagnostic Studies: as above   Treatments: n/a   Discharge Exam: Blood pressure 126/82, pulse 92, temperature 98.4 F (36.9 C), temperature source Oral, resp. rate 18, SpO2 100 %. General appearance: alert and cooperative Resp: clear to auscultation bilaterally Cardio: regular rate and rhythm, S1, S2 normal, no murmur, click, rub or gallop GI: soft, non-tender; bowel sounds normal; no masses,  no organomegaly  Incision C/D/I   Disposition: 01-Home or Self Care  Discharge Instructions    Apply steri strips    Complete by:  As directed      Call MD for:  difficulty breathing, headache or visual disturbances    Complete by:  As directed      Call MD for:  extreme fatigue    Complete by:  As directed      Call MD for:  hives    Complete by:  As directed      Call MD for:  persistant dizziness or light-headedness    Complete by:  As directed      Call MD for:  persistant nausea and vomiting    Complete by:  As directed      Call MD for:  redness, tenderness, or signs of infection (pain, swelling, redness, odor or green/yellow discharge around incision site)    Complete by:  As directed      Call MD for:  severe uncontrolled pain    Complete by:  As directed      Call MD for:  temperature >100.4    Complete by:  As directed      Diet - low sodium heart healthy    Complete by:  As directed      Increase activity slowly    Complete by:  As directed      Remove staples    Complete by:  As directed      Suggamadex Discharge  Instructions    Complete by:  As directed   During your recent anesthetic, you were given the medication sugammadex (Bridion). This medication interacts with hormonal forms of birth control (oral contraceptives and injected or implanted birth control) and may make them ineffective. IF YOU USE ANY HORMONAL FORM OF BIRTH CONTROL, YOU MUST USE AN ADDITIONAL BARRIER BIRTH CONTROL METHOD FOR SEVEN DAYS after receiving sugammadex (Bridion) or there is a chance you could become pregnant.            Medication List    STOP taking these medications        diphenhydramine-acetaminophen 25-500 MG Tabs tablet  Commonly known as:  TYLENOL PM      TAKE these medications        albuterol 108 (90 Base) MCG/ACT inhaler  Commonly known as:  PROVENTIL HFA;VENTOLIN HFA  Inhale 2 puffs into the lungs every 6 (six) hours as needed for wheezing or shortness of breath.     b complex vitamins tablet  Take 1 tablet by mouth daily.     calcium-vitamin D 500-200 MG-UNIT tablet  Commonly known as:  OSCAL WITH D  Take 1 tablet by mouth.  docusate sodium 100 MG capsule  Commonly known as:  COLACE  Take 1 capsule (100 mg total) by mouth 2 (two) times daily.     Ferrous Sulfate 27 MG Tabs  Take 1 tablet by mouth 2 (two) times daily.     ipratropium-albuterol 0.5-2.5 (3) MG/3ML Soln  Commonly known as:  DUONEB  Take 3 mLs by nebulization every 4 (four) hours as needed.     lubiprostone 24 MCG capsule  Commonly known as:  AMITIZA  Take 24 mcg by mouth 2 (two) times daily with a meal.     metoprolol 50 MG tablet  Commonly known as:  LOPRESSOR  Take 50 mg by mouth daily.     montelukast 10 MG tablet  Commonly known as:  SINGULAIR  Take 10 mg by mouth daily.     VITAMIN D-3 PO  Take 5,000 Int'l Units by mouth daily.           Follow-up Information    Follow up with Tilia Faso, MD In 2 weeks.   Specialty:  Obstetrics and Gynecology   Contact information:   [address] [place] [address] [phone]       Signed: Jennell Corner 06/12/2015, 10:06 AM

## 2015-06-12 NOTE — Progress Notes (Signed)
Patient understands all discharge instructions and the need to make follow up appointments. Patient discharge via wheelchair with auxillary. 

## 2015-12-01 ENCOUNTER — Other Ambulatory Visit
Admission: RE | Admit: 2015-12-01 | Discharge: 2015-12-01 | Disposition: A | Discharge status: Home / Self Care | Payer: 59 | Source: Ambulatory Visit | Attending: Nurse Practitioner | Admitting: Nurse Practitioner

## 2015-12-01 DIAGNOSIS — D509 Iron deficiency anemia, unspecified: Secondary | ICD-10-CM | POA: Insufficient documentation

## 2015-12-01 DIAGNOSIS — R5383 Other fatigue: Secondary | ICD-10-CM | POA: Insufficient documentation

## 2015-12-01 LAB — IRON AND TIBC
IRON: 56 ug/dL (ref 28–170)
Saturation Ratios: 11 % (ref 10.4–31.8)
TIBC: 498 ug/dL — ABNORMAL HIGH (ref 250–450)
UIBC: 442 ug/dL

## 2015-12-01 LAB — COMPREHENSIVE METABOLIC PANEL
ALT: 20 U/L (ref 14–54)
ANION GAP: 5 (ref 5–15)
AST: 21 U/L (ref 15–41)
Albumin: 3.5 g/dL (ref 3.5–5.0)
Alkaline Phosphatase: 82 U/L (ref 38–126)
BUN: 7 mg/dL (ref 6–20)
CHLORIDE: 105 mmol/L (ref 101–111)
CO2: 28 mmol/L (ref 22–32)
Calcium: 8.6 mg/dL — ABNORMAL LOW (ref 8.9–10.3)
Creatinine, Ser: 0.66 mg/dL (ref 0.44–1.00)
Glucose, Bld: 88 mg/dL (ref 65–99)
POTASSIUM: 4 mmol/L (ref 3.5–5.1)
Sodium: 138 mmol/L (ref 135–145)
Total Bilirubin: 1.1 mg/dL (ref 0.3–1.2)
Total Protein: 7.5 g/dL (ref 6.5–8.1)

## 2015-12-01 LAB — CBC WITH DIFFERENTIAL/PLATELET
BASOS PCT: 1 %
Basophils Absolute: 0 10*3/uL (ref 0–0.1)
EOS ABS: 0.1 10*3/uL (ref 0–0.7)
EOS PCT: 4 %
HEMATOCRIT: 34.5 % — AB (ref 35.0–47.0)
HEMOGLOBIN: 11.4 g/dL — AB (ref 12.0–16.0)
Lymphocytes Relative: 50 %
Lymphs Abs: 1.6 10*3/uL (ref 1.0–3.6)
MCH: 28.7 pg (ref 26.0–34.0)
MCHC: 33.1 g/dL (ref 32.0–36.0)
MCV: 86.6 fL (ref 80.0–100.0)
MONOS PCT: 7 %
Monocytes Absolute: 0.2 10*3/uL (ref 0.2–0.9)
NEUTROS PCT: 38 %
Neutro Abs: 1.2 10*3/uL — ABNORMAL LOW (ref 1.4–6.5)
Platelets: 191 10*3/uL (ref 150–440)
RBC: 3.98 MIL/uL (ref 3.80–5.20)
RDW: 16.1 % — ABNORMAL HIGH (ref 11.5–14.5)
WBC: 3.3 10*3/uL — ABNORMAL LOW (ref 3.6–11.0)

## 2015-12-01 LAB — FERRITIN: Ferritin: 6 ng/mL — ABNORMAL LOW (ref 11–307)

## 2015-12-01 LAB — TSH: TSH: 1.893 u[IU]/mL (ref 0.350–4.500)

## 2015-12-01 LAB — VITAMIN B12: VITAMIN B 12: 371 pg/mL (ref 180–914)

## 2015-12-02 LAB — T4: T4, Total: 6.7 ug/dL (ref 4.5–12.0)

## 2016-02-07 ENCOUNTER — Emergency Department
Admission: EM | Admit: 2016-02-07 | Discharge: 2016-02-07 | Disposition: A | Discharge status: Home / Self Care | Payer: 59 | Attending: Emergency Medicine | Admitting: Emergency Medicine

## 2016-02-07 ENCOUNTER — Encounter: Payer: Self-pay | Admitting: Emergency Medicine

## 2016-02-07 DIAGNOSIS — M25551 Pain in right hip: Secondary | ICD-10-CM | POA: Insufficient documentation

## 2016-02-07 DIAGNOSIS — Z79899 Other long term (current) drug therapy: Secondary | ICD-10-CM | POA: Diagnosis not present

## 2016-02-07 DIAGNOSIS — I10 Essential (primary) hypertension: Secondary | ICD-10-CM | POA: Insufficient documentation

## 2016-02-07 DIAGNOSIS — J45909 Unspecified asthma, uncomplicated: Secondary | ICD-10-CM | POA: Diagnosis not present

## 2016-02-07 MED ORDER — TRAMADOL HCL 50 MG PO TABS: 50.0000 mg | ORAL_TABLET | Freq: Four times a day (QID) | ORAL | 0 refills | Status: AC | PRN

## 2016-02-07 NOTE — ED Provider Notes (Signed)
[facility] Emergency Department Provider Note   ____________________________________________    I have reviewed the triage vital signs and the nursing notes.   HISTORY  Chief Complaint Hip Pain     HPI [name] is a 45 y.o. female who presents with complaints of right hip/buttock pain. She reports it has hurt for approximately 24 hours. She attributes the pain to sitting on a hard chair yesterday for most of the day. She denies numbness or weakness. No falls. No history of the same. She denies back pain. The pain occasionally radiates down her thigh. No urinary incontinence or saddle anesthesia or fevers or chills. No IV drug abuse   Past Medical History:  Diagnosis Date  . Anemia   . Asthma   . Complication of anesthesia    HAD ASTHMA ATTACK WHILE UNDER  . Hypertension   . Sleep apnea     Patient Active Problem List   Diagnosis Date Noted  . Postoperative state 06/09/2015  . 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 ABDOMINAL HYSTERECTOMY N/A 06/09/2015   Procedure: HYSTERECTOMY ABDOMINAL with Bilateral Salpingectomy;  Surgeon: Suzy Bouchard, MD;  Location: [facility] ORS;  Service: Gynecology;  Laterality: N/A;  . GASTRIC BYPASS    . REPLACEMENT TOTAL KNEE      Prior to Admission medications   Medication Sig Start Date End Date Taking? Authorizing Provider  albuterol (PROVENTIL HFA;VENTOLIN HFA) 108 (90 BASE) MCG/ACT inhaler Inhale 2 puffs into the lungs every 6 (six) hours as needed for wheezing or shortness of breath.    Historical Provider, MD  b complex vitamins tablet Take 1 tablet by mouth daily.    Historical Provider, MD  calcium-vitamin D (OSCAL WITH D) 500-200 MG-UNIT per tablet Take 1 tablet by mouth.    Historical Provider, MD  Cholecalciferol (VITAMIN D-3 PO) Take 5,000 Int'l Units by mouth daily.    Historical Provider, MD  docusate sodium (COLACE) 100 MG capsule Take 1  capsule (100 mg total) by mouth 2 (two) times daily. 06/12/15   Ihor Austin Schermerhorn, MD  Ferrous Sulfate 27 MG TABS Take 1 tablet by mouth 2 (two) times daily.    Historical Provider, MD  ipratropium-albuterol (DUONEB) 0.5-2.5 (3) MG/3ML SOLN Take 3 mLs by nebulization every 4 (four) hours as needed.    Historical Provider, MD  lubiprostone (AMITIZA) 24 MCG capsule Take 24 mcg by mouth 2 (two) times daily with a meal.    Historical Provider, MD  metoprolol (LOPRESSOR) 50 MG tablet Take 50 mg by mouth daily.    Historical Provider, MD  montelukast (SINGULAIR) 10 MG tablet Take 10 mg by mouth daily.    Historical Provider, MD  oxyCODONE-acetaminophen (ROXICET) 5-325 MG tablet Take 1-2 tablets by mouth every 4 (four) hours as needed for severe pain. 06/12/15   Ihor Austin Schermerhorn, MD  traMADol (ULTRAM) 50 MG tablet Take 1 tablet (50 mg total) by mouth every 6 (six) hours as needed. 02/07/16 02/06/17  Jene Every, MD     Allergies Review of patient's allergies indicates no known allergies.  History reviewed. No pertinent family history.  Social History Social History  Substance Use Topics  . Smoking status: Never Smoker  . Smokeless tobacco: Never Used  . Alcohol use No    Review of Systems  Constitutional: No fever/chills  ENT: No sore throat.   Gastrointestinal: No abdominal pain.  No nausea, no vomiting.   Genitourinary:  Negative for dysuria. Musculoskeletal: Negative for back pain. Skin: Negative for rash. Neurological: Negative for headaches     ____________________________________________   PHYSICAL EXAM:  VITAL SIGNS: ED Triage Vitals [02/07/16 0750]  Enc Vitals Group     BP (!) 146/99     Pulse Rate 91     Resp 18     Temp 98.2 F (36.8 C)     Temp Source Oral     SpO2 100 %     Weight 211 lb (95.7 kg)     Height 5\' 2"  (1.575 m)     Head Circumference      Peak Flow      Pain Score 8     Pain Loc      Pain Edu?      Excl. in GC?      Constitutional:  Alert and oriented. No acute distress. Pleasant and interactive   Cardiovascular: Normal rate, regular rhythm.  Respiratory: Normal respiratory effort.  No retractions. Genitourinary: deferred Musculoskeletal: Full range of motion of lower extremities. No pain with axial load on both hips. Pain is elicited by leaning her torso to the right . Mild tenderness to palpation in the right buttock. 2+ distal pulses. No abdominal tenderness to palpation. Normal reflexes Neurologic:  Normal speech and language. No gross focal neurologic deficits are appreciated.   Skin:  Skin is warm, dry and intact. No rash noted.   ____________________________________________   LABS (all labs ordered are listed, but only abnormal results are displayed)  Labs Reviewed - No data to display ____________________________________________  EKG   ____________________________________________  RADIOLOGY  None ____________________________________________   PROCEDURES  Procedure(s) performed: No    Critical Care performed: No ____________________________________________   INITIAL IMPRESSION / ASSESSMENT AND PLAN / ED COURSE  Pertinent labs & imaging results that were available during my care of the patient were reviewed by me and considered in my medical decision making (see chart for details).  Patient ambulating well. Suspect muscle spasm recommend supportive care and PCP follow-up.   ____________________________________________   FINAL CLINICAL IMPRESSION(S) / ED DIAGNOSES  Final diagnoses:  Hip pain, acute, right      NEW MEDICATIONS STARTED DURING THIS VISIT:  Discharge Medication List as of 02/07/2016  8:03 AM    START taking these medications   Details  traMADol (ULTRAM) 50 MG tablet Take 1 tablet (50 mg total) by mouth every 6 (six) hours as needed., Starting Sat 02/07/2016, Until Sun 02/06/2017, Print         Note:  This document was prepared using Dragon voice recognition  software and may include unintentional dictation errors.    Jene Everyobert Kamila Broda, MD 02/07/16 [id number]

## 2016-02-07 NOTE — ED Triage Notes (Signed)
Pt to ed with c/o right hip pain that radiates into right thigh.  Pt denies injury.

## 2016-02-07 NOTE — ED Notes (Signed)
MD at bedside. 

## 2016-02-07 NOTE — ED Notes (Signed)
Pt ambulatory to room without difficulty. Family at bedside.

## 2016-03-12 ENCOUNTER — Other Ambulatory Visit: Payer: Self-pay | Admitting: Physician Assistant

## 2016-03-12 ENCOUNTER — Ambulatory Visit
Admission: RE | Admit: 2016-03-12 | Discharge: 2016-03-12 | Disposition: A | Discharge status: Home / Self Care | Payer: Self-pay | Source: Ambulatory Visit | Attending: Physician Assistant | Admitting: Physician Assistant

## 2016-03-12 DIAGNOSIS — R05 Cough: Secondary | ICD-10-CM

## 2016-03-12 DIAGNOSIS — R059 Cough, unspecified: Secondary | ICD-10-CM

## 2016-06-16 DIAGNOSIS — J4520 Mild intermittent asthma, uncomplicated: Secondary | ICD-10-CM | POA: Diagnosis not present

## 2016-06-21 DIAGNOSIS — G4733 Obstructive sleep apnea (adult) (pediatric): Secondary | ICD-10-CM | POA: Diagnosis not present

## 2016-07-17 DIAGNOSIS — J4520 Mild intermittent asthma, uncomplicated: Secondary | ICD-10-CM | POA: Diagnosis not present

## 2016-07-29 DIAGNOSIS — I10 Essential (primary) hypertension: Secondary | ICD-10-CM | POA: Diagnosis not present

## 2016-07-29 DIAGNOSIS — J4520 Mild intermittent asthma, uncomplicated: Secondary | ICD-10-CM | POA: Diagnosis not present

## 2016-07-29 DIAGNOSIS — R609 Edema, unspecified: Secondary | ICD-10-CM | POA: Diagnosis not present

## 2016-08-05 ENCOUNTER — Other Ambulatory Visit
Admission: RE | Admit: 2016-08-05 | Discharge: 2016-08-05 | Disposition: A | Discharge status: Home / Self Care | Payer: 59 | Source: Ambulatory Visit | Attending: Nurse Practitioner | Admitting: Nurse Practitioner

## 2016-08-05 DIAGNOSIS — E162 Hypoglycemia, unspecified: Secondary | ICD-10-CM | POA: Diagnosis not present

## 2016-08-05 DIAGNOSIS — E611 Iron deficiency: Secondary | ICD-10-CM | POA: Insufficient documentation

## 2016-08-05 DIAGNOSIS — R635 Abnormal weight gain: Secondary | ICD-10-CM | POA: Diagnosis not present

## 2016-08-05 DIAGNOSIS — R5383 Other fatigue: Secondary | ICD-10-CM | POA: Insufficient documentation

## 2016-08-05 LAB — COMPREHENSIVE METABOLIC PANEL
ALK PHOS: 83 U/L (ref 38–126)
ALT: 21 U/L (ref 14–54)
AST: 23 U/L (ref 15–41)
Albumin: 3.8 g/dL (ref 3.5–5.0)
Anion gap: 5 (ref 5–15)
BILIRUBIN TOTAL: 0.8 mg/dL (ref 0.3–1.2)
BUN: 12 mg/dL (ref 6–20)
CALCIUM: 9.2 mg/dL (ref 8.9–10.3)
CO2: 29 mmol/L (ref 22–32)
CREATININE: 0.71 mg/dL (ref 0.44–1.00)
Chloride: 102 mmol/L (ref 101–111)
GFR calc non Af Amer: >60 mL/min (ref >60)
Glucose, Bld: 69 mg/dL (ref 65–99)
Potassium: 3.6 mmol/L (ref 3.5–5.1)
Sodium: 136 mmol/L (ref 135–145)
Total Protein: 7.9 g/dL (ref 6.5–8.1)

## 2016-08-05 LAB — IRON AND TIBC
IRON: 52 ug/dL (ref 28–170)
SATURATION RATIOS: 12 % (ref 10.4–31.8)
TIBC: 432 ug/dL (ref 250–450)
UIBC: 381 ug/dL

## 2016-08-05 LAB — CBC
HEMATOCRIT: 38.6 % (ref 35.0–47.0)
Hemoglobin: 13.7 g/dL (ref 12.0–16.0)
MCH: 31.7 pg (ref 26.0–34.0)
MCHC: 35.5 g/dL (ref 32.0–36.0)
MCV: 89.4 fL (ref 80.0–100.0)
Platelets: 234 10*3/uL (ref 150–440)
RBC: 4.32 MIL/uL (ref 3.80–5.20)
RDW: 12.4 % (ref 11.5–14.5)
WBC: 4.6 10*3/uL (ref 3.6–11.0)

## 2016-08-05 LAB — TSH: TSH: 0.597 u[IU]/mL (ref 0.350–4.500)

## 2016-08-05 LAB — FOLATE: Folate: 29 ng/mL (ref >5.9)

## 2016-08-05 LAB — T4, FREE: FREE T4: 0.73 ng/dL (ref 0.61–1.12)

## 2016-08-05 LAB — VITAMIN B12: Vitamin B-12: 376 pg/mL (ref 180–914)

## 2016-08-06 LAB — HEMOGLOBIN A1C
Hgb A1c MFr Bld: 5.4 % (ref 4.8–5.6)
MEAN PLASMA GLUCOSE: 108 mg/dL

## 2016-08-12 DIAGNOSIS — R609 Edema, unspecified: Secondary | ICD-10-CM | POA: Diagnosis not present

## 2016-08-12 DIAGNOSIS — I10 Essential (primary) hypertension: Secondary | ICD-10-CM | POA: Diagnosis not present

## 2016-08-12 DIAGNOSIS — J4520 Mild intermittent asthma, uncomplicated: Secondary | ICD-10-CM | POA: Diagnosis not present

## 2016-08-14 DIAGNOSIS — J4520 Mild intermittent asthma, uncomplicated: Secondary | ICD-10-CM | POA: Diagnosis not present

## 2016-08-16 DIAGNOSIS — M25561 Pain in right knee: Secondary | ICD-10-CM | POA: Diagnosis not present

## 2016-08-16 DIAGNOSIS — Z96651 Presence of right artificial knee joint: Secondary | ICD-10-CM | POA: Diagnosis not present

## 2016-08-18 ENCOUNTER — Emergency Department: Payer: 59

## 2016-08-18 ENCOUNTER — Encounter: Payer: Self-pay | Admitting: Emergency Medicine

## 2016-08-18 ENCOUNTER — Emergency Department
Admission: EM | Admit: 2016-08-18 | Discharge: 2016-08-18 | Disposition: A | Discharge status: Home / Self Care | Payer: 59 | Attending: Student in an Organized Health Care Education/Training Program | Admitting: Student in an Organized Health Care Education/Training Program

## 2016-08-18 DIAGNOSIS — M542 Cervicalgia: Secondary | ICD-10-CM

## 2016-08-18 DIAGNOSIS — S0990XA Unspecified injury of head, initial encounter: Secondary | ICD-10-CM | POA: Diagnosis present

## 2016-08-18 DIAGNOSIS — Y929 Unspecified place or not applicable: Secondary | ICD-10-CM | POA: Diagnosis not present

## 2016-08-18 DIAGNOSIS — Y999 Unspecified external cause status: Secondary | ICD-10-CM | POA: Insufficient documentation

## 2016-08-18 DIAGNOSIS — J45909 Unspecified asthma, uncomplicated: Secondary | ICD-10-CM | POA: Insufficient documentation

## 2016-08-18 DIAGNOSIS — R410 Disorientation, unspecified: Secondary | ICD-10-CM | POA: Diagnosis not present

## 2016-08-18 DIAGNOSIS — Y939 Activity, unspecified: Secondary | ICD-10-CM | POA: Diagnosis not present

## 2016-08-18 DIAGNOSIS — I10 Essential (primary) hypertension: Secondary | ICD-10-CM | POA: Diagnosis not present

## 2016-08-18 DIAGNOSIS — W1809XA Striking against other object with subsequent fall, initial encounter: Secondary | ICD-10-CM | POA: Insufficient documentation

## 2016-08-18 DIAGNOSIS — R55 Syncope and collapse: Secondary | ICD-10-CM | POA: Diagnosis not present

## 2016-08-18 LAB — URINALYSIS, COMPLETE (UACMP) WITH MICROSCOPIC
BACTERIA UA: NONE SEEN
BILIRUBIN URINE: NEGATIVE
Glucose, UA: NEGATIVE mg/dL
HGB URINE DIPSTICK: NEGATIVE
Ketones, ur: NEGATIVE mg/dL
LEUKOCYTES UA: NEGATIVE
NITRITE: NEGATIVE
PH: 5 (ref 5.0–8.0)
Protein, ur: NEGATIVE mg/dL
SPECIFIC GRAVITY, URINE: 1.021 (ref 1.005–1.030)

## 2016-08-18 LAB — BASIC METABOLIC PANEL
ANION GAP: 6 (ref 5–15)
BUN: 13 mg/dL (ref 6–20)
CO2: 28 mmol/L (ref 22–32)
Calcium: 9.2 mg/dL (ref 8.9–10.3)
Chloride: 103 mmol/L (ref 101–111)
Creatinine, Ser: 0.75 mg/dL (ref 0.44–1.00)
GFR calc Af Amer: >60 mL/min (ref >60)
Glucose, Bld: 94 mg/dL (ref 65–99)
POTASSIUM: 3.6 mmol/L (ref 3.5–5.1)
SODIUM: 137 mmol/L (ref 135–145)

## 2016-08-18 LAB — CBC
HEMATOCRIT: 43.1 % (ref 35.0–47.0)
Hemoglobin: 14.6 g/dL (ref 12.0–16.0)
MCH: 30.6 pg (ref 26.0–34.0)
MCHC: 34 g/dL (ref 32.0–36.0)
MCV: 89.9 fL (ref 80.0–100.0)
Platelets: 248 10*3/uL (ref 150–440)
RBC: 4.79 MIL/uL (ref 3.80–5.20)
RDW: 12.8 % (ref 11.5–14.5)
WBC: 4 10*3/uL (ref 3.6–11.0)

## 2016-08-18 MED ORDER — PROMETHAZINE HCL 25 MG PO TABS
12.5000 mg | ORAL_TABLET | Freq: Once | ORAL | Status: AC
Start: 2016-08-18 — End: 2016-08-18
  Administered 2016-08-18: 12.5 mg via ORAL
  Filled 2016-08-18: qty 1

## 2016-08-18 MED ORDER — PROMETHAZINE HCL 12.5 MG PO TABS: 12.5000 mg | ORAL_TABLET | Freq: Four times a day (QID) | ORAL | 0 refills | Status: DC | PRN

## 2016-08-18 MED ORDER — CYCLOBENZAPRINE HCL 10 MG PO TABS: 10.0000 mg | ORAL_TABLET | Freq: Three times a day (TID) | ORAL | 0 refills | Status: DC | PRN

## 2016-08-18 MED ORDER — CYCLOBENZAPRINE HCL 10 MG PO TABS
10.0000 mg | ORAL_TABLET | Freq: Once | ORAL | Status: AC
  Administered 2016-08-18: 10 mg via ORAL
  Filled 2016-08-18: qty 1

## 2016-08-18 NOTE — ED Triage Notes (Signed)
Says a child ran into her on Monday and injured right leg.  [facility].  Says she was given prednisone and tramadol .  She took a tramadol on Monday and passed out in bathroom later, hit head on shower.  Now with neck pain, back pain.

## 2016-08-18 NOTE — ED Notes (Addendum)
Patient transported to CT and xray 

## 2016-08-18 NOTE — ED Provider Notes (Signed)
[facility] Emergency Department Provider Note    First MD Initiated Contact with Patient 08/18/16 1126     (approximate)  I have reviewed the triage vital signs and the nursing notes.   HISTORY  Chief Complaint Loss of Consciousness; Back Pain; Neck Injury; and Head Injury    HPI [name] is a 46 y.o. female presents for evaluation of confusion, blurry vision as well as neck pain after having a fall from standing on Monday. Patient was seen at her dental clinic for knee pain. She was discharged with prednisone as well as Ultram. After taking one pill Ultram on Monday. The patient started feeling lightheaded and dizzy associated with nausea. She walked to the bathroom and vomiting and fell to the ground. EMS evaluated her. At that point she had declined transfer to the ER. There is no true LOC. Since then she has had persistent neck pain, blurry vision and some confusion. Tried to go to canal clinic to be checked out today but they directed her to the ER.   Past Medical History:  Diagnosis Date  . Anemia   . Asthma   . Complication of anesthesia    HAD ASTHMA ATTACK WHILE UNDER  . Hypertension   . Sleep apnea    No family history on file. Past Surgical History:  Procedure Laterality Date  . ABDOMINAL HYSTERECTOMY N/A 06/09/2015   Procedure: HYSTERECTOMY ABDOMINAL with Bilateral Salpingectomy;  Surgeon: Suzy Bouchardhomas J Schermerhorn, MD;  Location: [facility] ORS;  Service: Gynecology;  Laterality: N/A;  . GASTRIC BYPASS    . REPLACEMENT TOTAL KNEE     Patient Active Problem List   Diagnosis Date Noted  . Postoperative state 06/09/2015  . 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 Authorizing Provider  albuterol (PROVENTIL HFA;VENTOLIN HFA) 108 (90 BASE) MCG/ACT inhaler Inhale 2 puffs into the lungs every 6 (six) hours as needed for wheezing or shortness of breath.    Historical Provider, MD   b complex vitamins tablet Take 1 tablet by mouth daily.    Historical Provider, MD  calcium-vitamin D (OSCAL WITH D) 500-200 MG-UNIT per tablet Take 1 tablet by mouth.    Historical Provider, MD  Cholecalciferol (VITAMIN D-3 PO) Take 5,000 Int'l Units by mouth daily.    Historical Provider, MD  docusate sodium (COLACE) 100 MG capsule Take 1 capsule (100 mg total) by mouth 2 (two) times daily. 06/12/15   Ihor Austinhomas J Schermerhorn, MD  Ferrous Sulfate 27 MG TABS Take 1 tablet by mouth 2 (two) times daily.    Historical Provider, MD  ipratropium-albuterol (DUONEB) 0.5-2.5 (3) MG/3ML SOLN Take 3 mLs by nebulization every 4 (four) hours as needed.    Historical Provider, MD  lubiprostone (AMITIZA) 24 MCG capsule Take 24 mcg by mouth 2 (two) times daily with a meal.    Historical Provider, MD  metoprolol (LOPRESSOR) 50 MG tablet Take 50 mg by mouth daily.    Historical Provider, MD  montelukast (SINGULAIR) 10 MG tablet Take 10 mg by mouth daily.    Historical Provider, MD  oxyCODONE-acetaminophen (ROXICET) 5-325 MG tablet Take 1-2 tablets by mouth every 4 (four) hours as needed for severe pain. 06/12/15   Ihor Austinhomas J Schermerhorn, MD  traMADol (ULTRAM) 50 MG tablet Take 1 tablet (50 mg total) by mouth every 6 (six) hours as needed. 02/07/16 02/06/17  Jene Everyobert Kinner, MD    Allergies Patient has  no known allergies.    Social History Social History  Substance Use Topics  . Smoking status: Never Smoker  . Smokeless tobacco: Never Used  . Alcohol use No    Review of Systems Patient denies headaches, rhinorrhea, blurry vision, numbness, shortness of breath, chest pain, edema, cough, abdominal pain, nausea, vomiting, diarrhea, dysuria, fevers, rashes or hallucinations unless otherwise stated above in HPI. ____________________________________________   PHYSICAL EXAM:  VITAL SIGNS: Vitals:   08/18/16 1147 08/18/16 1200  BP: (!) 135/98 130/82  Pulse: 87 64  Resp: 17 18    Constitutional: Alert and  oriented. Well appearing and in no acute distress. Eyes: Conjunctivae are normal. PERRL. EOMI. Head: Atraumatic. Nose: No congestion/rhinnorhea. Mouth/Throat: Mucous membranes are moist.  Oropharynx non-erythematous. Neck: No stridor. Bilateral paraspinal ttp, no midline ttp.  Most tender at trapexius bilaterally Hematological/Lymphatic/Immunilogical: No cervical lymphadenopathy. Cardiovascular: Normal rate, regular rhythm. Grossly normal heart sounds.  Good peripheral circulation. Respiratory: Normal respiratory effort.  No retractions. Lungs CTAB. Gastrointestinal: Soft and nontender. No distention. No abdominal bruits. No CVA tenderness. Musculoskeletal: No lower extremity tenderness nor edema.  No joint effusions. Neurologic:  CN- intact.  No facial droop, Normal FNF.  Normal heel to shin.  Sensation intact bilaterally. Normal speech and language. No gross focal neurologic deficits are appreciated. No gait instability.  Skin:  Skin is warm, dry and intact. No rash noted. Psychiatric: Mood and affect are normal. Speech and behavior are normal.  ____________________________________________   LABS (all labs ordered are listed, but only abnormal results are displayed)  Results for orders placed or performed during the hospital encounter of 08/18/16 (from the past 24 hour(s))  Basic metabolic panel     Status: None   Collection Time: 08/18/16 11:20 AM  Result Value Ref Range   Sodium 137 135 - 145 mmol/L   Potassium 3.6 3.5 - 5.1 mmol/L   Chloride 103 101 - 111 mmol/L   CO2 28 22 - 32 mmol/L   Glucose, Bld 94 65 - 99 mg/dL   BUN 13 6 - 20 mg/dL   Creatinine, Ser 6.96 0.44 - 1.00 mg/dL   Calcium 9.2 8.9 - 29.5 mg/dL   GFR calc non Af Amer >60 >60 mL/min   GFR calc Af Amer >60 >60 mL/min   Anion gap 6 5 - 15  CBC     Status: None   Collection Time: 08/18/16 11:20 AM  Result Value Ref Range   WBC 4.0 3.6 - 11.0 K/uL   RBC 4.79 3.80 - 5.20 MIL/uL   Hemoglobin 14.6 12.0 - 16.0 g/dL    HCT 28.4 13.2 - 44.0 %   MCV 89.9 80.0 - 100.0 fL   MCH 30.6 26.0 - 34.0 pg   MCHC 34.0 32.0 - 36.0 g/dL   RDW 10.2 72.5 - 36.6 %   Platelets 248 150 - 440 K/uL  Urinalysis, Complete w Microscopic     Status: Abnormal   Collection Time: 08/18/16 11:20 AM  Result Value Ref Range   Color, Urine YELLOW (A) YELLOW   APPearance CLEAR (A) CLEAR   Specific Gravity, Urine 1.021 1.005 - 1.030   pH 5.0 5.0 - 8.0   Glucose, UA NEGATIVE NEGATIVE mg/dL   Hgb urine dipstick NEGATIVE NEGATIVE   Bilirubin Urine NEGATIVE NEGATIVE   Ketones, ur NEGATIVE NEGATIVE mg/dL   Protein, ur NEGATIVE NEGATIVE mg/dL   Nitrite NEGATIVE NEGATIVE   Leukocytes, UA NEGATIVE NEGATIVE   RBC / HPF 0-5 0 - 5 RBC/hpf  WBC, UA 0-5 0 - 5 WBC/hpf   Bacteria, UA NONE SEEN NONE SEEN   Squamous Epithelial / LPF 0-5 (A) NONE SEEN   Mucous PRESENT    Hyaline Casts, UA PRESENT    ____________________________________________  EKG My review and personal interpretation at Time: 11:15   Indication: fall  Rate: 65  Rhythm: sinus Axis: normal Other: normal intervals, no st elevations or depressions ____________________________________________  RADIOLOGY  I personally reviewed all radiographic images ordered to evaluate for the above acute complaints and reviewed radiology reports and findings.  These findings were personally discussed with the patient.  Please see medical record for radiology report.  ____________________________________________   PROCEDURES  Procedure(s) performed:  Procedures    Critical Care performed: no ____________________________________________   INITIAL IMPRESSION / ASSESSMENT AND PLAN / ED COURSE  Pertinent labs & imaging results that were available during my care of the patient were reviewed by me and considered in my medical decision making (see chart for details).  DDX: spasm,fracture, concussion, contussion  [name] is a 46 y.o. who presents to the ED with Complaints  as described above. She arrives afebrile hemodynamic stable. Event happened 2 days ago and patient is without any focal neuro deficits but does have confusion.  CT imaging ordered to evaluate for any evidence of intracranial injury shows none. Symptoms likely secondary to concussion. Remainder exam is reassuring. Blood work is also reassuring. No evidence of rib fractures or vertebral injury. Most likely secondary to cervical strain. We'll provide muscle relaxers and follow up with PCP.  Patient was able to tolerate PO and was able to ambulate with a steady gait.  Have discussed with the patient and available family all diagnostics and treatments performed thus far and all questions were answered to the best of my ability. The patient demonstrates understanding and agreement with plan.       ____________________________________________   FINAL CLINICAL IMPRESSION(S) / ED DIAGNOSES  Final diagnoses:  Neck pain      NEW MEDICATIONS STARTED DURING THIS VISIT:  New Prescriptions   No medications on file     Note:  This document was prepared using Dragon voice recognition software and may include unintentional dictation errors.    Willy Eddy, MD 08/18/16 1400

## 2016-09-14 ENCOUNTER — Other Ambulatory Visit (HOSPITAL_COMMUNITY): Payer: Self-pay | Admitting: Orthopedic Surgery

## 2016-09-14 ENCOUNTER — Other Ambulatory Visit: Payer: Self-pay | Admitting: Orthopedic Surgery

## 2016-09-14 DIAGNOSIS — J4520 Mild intermittent asthma, uncomplicated: Secondary | ICD-10-CM | POA: Diagnosis not present

## 2016-09-14 DIAGNOSIS — T84032A Mechanical loosening of internal right knee prosthetic joint, initial encounter: Secondary | ICD-10-CM

## 2016-09-22 ENCOUNTER — Encounter
Admission: RE | Admit: 2016-09-22 | Discharge: 2016-09-22 | Disposition: A | Discharge status: Home / Self Care | Payer: Worker's Compensation | Source: Ambulatory Visit | Attending: Orthopedic Surgery | Admitting: Orthopedic Surgery

## 2016-09-22 DIAGNOSIS — T84032A Mechanical loosening of internal right knee prosthetic joint, initial encounter: Secondary | ICD-10-CM

## 2016-09-22 DIAGNOSIS — G4733 Obstructive sleep apnea (adult) (pediatric): Secondary | ICD-10-CM | POA: Diagnosis not present

## 2016-09-22 MED ORDER — TECHNETIUM TC 99M MEDRONATE IV KIT
21.3910 | PACK | Freq: Once | INTRAVENOUS | Status: AC | PRN
  Administered 2016-09-22: 21.391 via INTRAVENOUS

## 2016-10-14 DIAGNOSIS — J4520 Mild intermittent asthma, uncomplicated: Secondary | ICD-10-CM | POA: Diagnosis not present

## 2016-11-12 DIAGNOSIS — M25561 Pain in right knee: Secondary | ICD-10-CM | POA: Diagnosis not present

## 2016-11-14 DIAGNOSIS — J4520 Mild intermittent asthma, uncomplicated: Secondary | ICD-10-CM | POA: Diagnosis not present

## 2016-12-14 DIAGNOSIS — J4520 Mild intermittent asthma, uncomplicated: Secondary | ICD-10-CM | POA: Diagnosis not present

## 2017-01-14 DIAGNOSIS — J4520 Mild intermittent asthma, uncomplicated: Secondary | ICD-10-CM | POA: Diagnosis not present

## 2017-01-26 DIAGNOSIS — G4733 Obstructive sleep apnea (adult) (pediatric): Secondary | ICD-10-CM | POA: Diagnosis not present

## 2017-02-10 DIAGNOSIS — J4520 Mild intermittent asthma, uncomplicated: Secondary | ICD-10-CM | POA: Diagnosis not present

## 2017-02-10 DIAGNOSIS — R300 Dysuria: Secondary | ICD-10-CM | POA: Diagnosis not present

## 2017-02-14 DIAGNOSIS — J4520 Mild intermittent asthma, uncomplicated: Secondary | ICD-10-CM | POA: Diagnosis not present

## 2017-03-14 DIAGNOSIS — R7301 Impaired fasting glucose: Secondary | ICD-10-CM | POA: Diagnosis not present

## 2017-03-16 DIAGNOSIS — J4520 Mild intermittent asthma, uncomplicated: Secondary | ICD-10-CM | POA: Diagnosis not present

## 2017-04-16 DIAGNOSIS — J4520 Mild intermittent asthma, uncomplicated: Secondary | ICD-10-CM | POA: Diagnosis not present

## 2017-04-29 DIAGNOSIS — G4733 Obstructive sleep apnea (adult) (pediatric): Secondary | ICD-10-CM | POA: Diagnosis not present

## 2017-05-16 DIAGNOSIS — J4520 Mild intermittent asthma, uncomplicated: Secondary | ICD-10-CM | POA: Diagnosis not present

## 2017-05-27 ENCOUNTER — Ambulatory Visit: Payer: Self-pay | Admitting: Nurse Practitioner

## 2017-05-28 ENCOUNTER — Other Ambulatory Visit
Admission: RE | Admit: 2017-05-28 | Discharge: 2017-05-28 | Disposition: A | Discharge status: Home / Self Care | Payer: 59 | Source: Ambulatory Visit | Attending: Nurse Practitioner | Admitting: Nurse Practitioner

## 2017-05-28 DIAGNOSIS — R509 Fever, unspecified: Secondary | ICD-10-CM | POA: Diagnosis not present

## 2017-05-28 DIAGNOSIS — E162 Hypoglycemia, unspecified: Secondary | ICD-10-CM | POA: Diagnosis not present

## 2017-05-28 DIAGNOSIS — R635 Abnormal weight gain: Secondary | ICD-10-CM | POA: Diagnosis present

## 2017-05-28 DIAGNOSIS — D509 Iron deficiency anemia, unspecified: Secondary | ICD-10-CM | POA: Diagnosis present

## 2017-05-28 DIAGNOSIS — R5383 Other fatigue: Secondary | ICD-10-CM | POA: Diagnosis not present

## 2017-05-28 LAB — TSH: TSH: 0.823 u[IU]/mL (ref 0.350–4.500)

## 2017-05-28 LAB — COMPREHENSIVE METABOLIC PANEL
ALBUMIN: 3.5 g/dL (ref 3.5–5.0)
ALK PHOS: 80 U/L (ref 38–126)
ALT: 17 U/L (ref 14–54)
AST: 26 U/L (ref 15–41)
Anion gap: 9 (ref 5–15)
BUN: 7 mg/dL (ref 6–20)
CO2: 27 mmol/L (ref 22–32)
CREATININE: 0.87 mg/dL (ref 0.44–1.00)
Calcium: 9 mg/dL (ref 8.9–10.3)
Chloride: 101 mmol/L (ref 101–111)
GFR calc Af Amer: >60 mL/min (ref >60)
GFR calc non Af Amer: >60 mL/min (ref >60)
GLUCOSE: 116 mg/dL — AB (ref 65–99)
Potassium: 2.9 mmol/L — ABNORMAL LOW (ref 3.5–5.1)
SODIUM: 137 mmol/L (ref 135–145)
TOTAL PROTEIN: 7.7 g/dL (ref 6.5–8.1)
Total Bilirubin: 1.1 mg/dL (ref 0.3–1.2)

## 2017-05-28 LAB — VITAMIN B12: Vitamin B-12: 5946 pg/mL — ABNORMAL HIGH (ref 180–914)

## 2017-05-28 LAB — CBC
HCT: 42 % (ref 35.0–47.0)
HEMOGLOBIN: 14.2 g/dL (ref 12.0–16.0)
MCH: 30.8 pg (ref 26.0–34.0)
MCHC: 33.7 g/dL (ref 32.0–36.0)
MCV: 91.4 fL (ref 80.0–100.0)
Platelets: 226 10*3/uL (ref 150–440)
RBC: 4.59 MIL/uL (ref 3.80–5.20)
RDW: 12.9 % (ref 11.5–14.5)
WBC: 3.7 10*3/uL (ref 3.6–11.0)

## 2017-05-28 LAB — FERRITIN: FERRITIN: 35 ng/mL (ref 11–307)

## 2017-05-28 LAB — T4, FREE: FREE T4: 0.79 ng/dL (ref 0.61–1.12)

## 2017-05-30 ENCOUNTER — Other Ambulatory Visit: Payer: Self-pay | Admitting: Nurse Practitioner

## 2017-05-30 ENCOUNTER — Other Ambulatory Visit: Payer: Self-pay

## 2017-05-30 DIAGNOSIS — E876 Hypokalemia: Secondary | ICD-10-CM

## 2017-05-30 MED ORDER — POTASSIUM CHLORIDE ER 10 MEQ PO TBCR: 10.0000 meq | EXTENDED_RELEASE_TABLET | Freq: Every day | ORAL | 3 refills | Status: DC

## 2017-06-13 ENCOUNTER — Encounter: Payer: Self-pay | Admitting: Nurse Practitioner

## 2017-06-13 ENCOUNTER — Ambulatory Visit: Payer: 59 | Admitting: Nurse Practitioner

## 2017-06-13 VITALS — BP 123/87 | HR 72 | Temp 98.1°F | Wt 225.2 lb

## 2017-06-13 DIAGNOSIS — R059 Cough, unspecified: Secondary | ICD-10-CM

## 2017-06-13 DIAGNOSIS — E876 Hypokalemia: Secondary | ICD-10-CM | POA: Diagnosis not present

## 2017-06-13 DIAGNOSIS — R062 Wheezing: Secondary | ICD-10-CM

## 2017-06-13 DIAGNOSIS — R05 Cough: Secondary | ICD-10-CM | POA: Diagnosis not present

## 2017-06-13 DIAGNOSIS — J0110 Acute frontal sinusitis, unspecified: Secondary | ICD-10-CM | POA: Diagnosis not present

## 2017-06-13 DIAGNOSIS — J029 Acute pharyngitis, unspecified: Secondary | ICD-10-CM

## 2017-06-13 DIAGNOSIS — J4521 Mild intermittent asthma with (acute) exacerbation: Secondary | ICD-10-CM | POA: Diagnosis not present

## 2017-06-13 LAB — POCT RAPID STREP A (OFFICE): Rapid Strep A Screen: NEGATIVE

## 2017-06-13 MED ORDER — PREDNISONE 10 MG (21) PO TBPK: ORAL_TABLET | ORAL | 0 refills | Status: DC

## 2017-06-13 MED ORDER — IPRATROPIUM-ALBUTEROL 0.5-2.5 (3) MG/3ML IN SOLN
3.0000 mL | Freq: Once | RESPIRATORY_TRACT | Status: AC
  Administered 2017-06-13: 3 mL via RESPIRATORY_TRACT

## 2017-06-13 MED ORDER — AZITHROMYCIN 250 MG PO TABS: ORAL_TABLET | ORAL | 0 refills | Status: DC

## 2017-06-13 NOTE — Progress Notes (Signed)
[facility] [address] [place], [place] [id number]  Internal MEDICINE  Office Visit Note  Patient Name: [name]  [id number]  [id number]  Date of Service: 06/13/2017  Chief Complaint  Patient presents with  . Sore Throat  . Cough    sometimes has feeling to throw up and yellow mucus  . Headache    from coughing  . Shortness of Breath    moving around, starts more of a cough and hoarseness in voice  . Nasal Congestion    yellow mucus  . Ear Problem    ear ache but not long periods of time     URI   This is a new problem. The current episode started in the past 7 days. The problem has been gradually worsening. The maximum temperature recorded prior to her arrival was 100.4 - 100.9 F. The fever has been present for less than 1 day. Associated symptoms include congestion, coughing, headaches, nausea, rhinorrhea, sinus pain, a sore throat, swollen glands and wheezing. She has tried inhaler use and acetaminophen (nebulizer treatments) for the symptoms. The treatment provided mild relief.   Pt is here for a sick visit.     Current Medication:  Outpatient Encounter Medications as of 06/13/2017  Medication Sig  . albuterol (PROVENTIL HFA;VENTOLIN HFA) 108 (90 BASE) MCG/ACT inhaler Inhale 2 puffs into the lungs every 6 (six) hours as needed for wheezing or shortness of breath.  Marland Kitchen. b complex vitamins tablet Take 1 tablet by mouth daily.  . budesonide-formoterol (SYMBICORT) 160-4.5 MCG/ACT inhaler Inhale 2 puffs into the lungs every 12 (twelve) hours.  . calcium-vitamin D (OSCAL WITH D) 500-200 MG-UNIT per tablet Take 1 tablet by mouth.  . Cholecalciferol (VITAMIN D-3 PO) Take 5,000 Int'l Units by mouth daily.  Marland Kitchen. docusate sodium (COLACE) 100 MG capsule Take 1 capsule (100 mg total) by mouth 2 (two) times daily.  . Ferrous Sulfate 27 MG TABS Take 1 tablet by mouth 2 (two) times daily.  Marland Kitchen. ipratropium-albuterol (DUONEB) 0.5-2.5 (3) MG/3ML SOLN Take 3 mLs by  nebulization every 4 (four) hours as needed.  . linaclotide (LINZESS) 145 MCG CAPS capsule Take 145 mcg by mouth daily. Take 1 cap with meal.  . metoprolol (LOPRESSOR) 50 MG tablet Take 50 mg by mouth daily.  . montelukast (SINGULAIR) 10 MG tablet Take 10 mg by mouth daily.  . potassium chloride (K-DUR) 10 MEQ tablet Take 1 tablet (10 mEq total) by mouth daily.  Marland Kitchen. azithromycin (ZITHROMAX) 250 MG tablet z-pack - take as directed for 5 days  . cyclobenzaprine (FLEXERIL) 10 MG tablet Take 1 tablet (10 mg total) by mouth 3 (three) times daily as needed for muscle spasms. (Patient not taking: Reported on 06/13/2017)  . lubiprostone (AMITIZA) 24 MCG capsule Take 24 mcg by mouth 2 (two) times daily with a meal.  . oxyCODONE-acetaminophen (ROXICET) 5-325 MG tablet Take 1-2 tablets by mouth every 4 (four) hours as needed for severe pain. (Patient not taking: Reported on 06/13/2017)  . predniSONE (STERAPRED UNI-PAK 21 TAB) 10 MG (21) TBPK tablet 6 day taper - take by mouth as directed for 6 days  . promethazine (PHENERGAN) 12.5 MG tablet Take 1 tablet (12.5 mg total) by mouth every 6 (six) hours as needed for nausea or vomiting. (Patient not taking: Reported on 06/13/2017)  . [EXPIRED] ipratropium-albuterol (DUONEB) 0.5-2.5 (3) MG/3ML nebulizer solution 3 mL    No facility-administered encounter medications on file as of 06/13/2017.       Medical  History: Past Medical History:  Diagnosis Date  . Allergy    environmental  . Anemia   . Asthma   . Bronchitis, acute   . Complication of anesthesia    HAD ASTHMA ATTACK WHILE UNDER  . Hypertension   . Sleep apnea      Today's Vitals   06/13/17 1447  BP: 123/87  Pulse: 72  Temp: 98.1 F (36.7 C)  SpO2: 100%  Weight: 225 lb 3.2 oz (102.2 kg)    Review of Systems  Constitutional: Positive for activity change, fatigue and fever.       Low grade fever last Thursday. No fever since then .  HENT: Positive for congestion, postnasal drip, rhinorrhea,  sinus pain and sore throat.   Eyes: Negative.   Respiratory: Positive for cough, shortness of breath and wheezing.   Gastrointestinal: Positive for nausea.  Musculoskeletal: Positive for arthralgias and back pain.  Skin: Negative.   Allergic/Immunologic: Positive for environmental allergies.  Neurological: Positive for headaches.  Hematological: Negative.   Psychiatric/Behavioral: Negative.     Physical Exam  Constitutional: She is oriented to person, place, and time. She appears well-developed and well-nourished.  HENT:  Head: Normocephalic.  Eyes: Pupils are equal, round, and reactive to light.  Neck: Normal range of motion. Neck supple.  Cardiovascular: Normal rate and regular rhythm.  Pulmonary/Chest: Effort normal and breath sounds normal.  Abdominal: Soft. There is no tenderness.  Musculoskeletal: Normal range of motion.  Neurological: She is alert and oriented to person, place, and time.  Skin: Skin is warm and dry.  Psychiatric: She has a normal mood and affect.  Nursing note and vitals reviewed.   Assessment/Plan:   ICD-10-CM   1. Acute non-recurrent frontal sinusitis J01.10 azithromycin (ZITHROMAX) 250 MG tablet  2. Mild intermittent asthma with acute exacerbation in adult J45.21 predniSONE (STERAPRED UNI-PAK 21 TAB) 10 MG (21) TBPK tablet  3. Wheezing R06.2 ipratropium-albuterol (DUONEB) 0.5-2.5 (3) MG/3ML nebulizer solution 3 mL  4. Sorethroat J02.9 POCT rapid strep A   1. zpack - take as directed for 5 days. OTC cough/cold med to relieve symptoms. Work note keeping her out of work for tomorrow.  2. Prednisone dose pack - take as directed for 6 days. Use neb treatments and inhalers as needed and as prescribed.  3. Breathing treatment with Duoneb given in the office. Patient experienced moderate symptom relief.  4. Rapid strep swab negative today.  5. Check BMP 6. When symptoms still present after first round antibiotics and prednisone, changed abx to levofloxacin  500ng daily for 10 days. Redo prednisone taper for 6 days. Added tussionex for cough, twice daily as needed for cough.   She should follow up 3 months and sooner if needed.   General Counseling: Nori verbalizes  understanding of the findings of todays visit and agrees with plan of treatment. I have discussed any further diagnostic evaluation that may be needed or ordered today. We also reviewed her medications today. she has been encouraged to call the office with any questions or concerns that should arise related to todays visit.  This patient was seen by [name] Gros, FNP- C in Collaboration with Dr Lyndon Code as a part of collaborative care agreement    Orders Placed This Encounter  Procedures  . POCT rapid strep A    Meds ordered this encounter  Medications  . ipratropium-albuterol (DUONEB) 0.5-2.5 (3) MG/3ML nebulizer solution 3 mL  . azithromycin (ZITHROMAX) 250 MG tablet    Sig: z-pack -  take as directed for 5 days    Dispense:  6 tablet    Refill:  0    Order Specific Question:   Supervising Provider    Answer:   Lyndon Code [[id number]]  . predniSONE (STERAPRED UNI-PAK 21 TAB) 10 MG (21) TBPK tablet    Sig: 6 day taper - take by mouth as directed for 6 days    Dispense:  21 tablet    Refill:  0    Order Specific Question:   Supervising Provider    Answer:   Lyndon Code [[id number]]    Time spent: 25 Minutes

## 2017-06-20 MED ORDER — HYDROCOD POLST-CPM POLST ER 10-8 MG/5ML PO SUER: 5.0000 mL | Freq: Two times a day (BID) | ORAL | 0 refills | Status: DC | PRN

## 2017-06-20 MED ORDER — LEVOFLOXACIN 500 MG PO TABS: 500.0000 mg | ORAL_TABLET | Freq: Every day | ORAL | 0 refills | Status: DC

## 2017-06-20 MED ORDER — PREDNISONE 10 MG (21) PO TBPK: ORAL_TABLET | ORAL | 0 refills | Status: DC

## 2017-06-21 ENCOUNTER — Other Ambulatory Visit: Payer: Self-pay | Admitting: Nurse Practitioner

## 2017-06-21 DIAGNOSIS — J069 Acute upper respiratory infection, unspecified: Secondary | ICD-10-CM

## 2017-06-21 MED ORDER — OSELTAMIVIR PHOSPHATE 75 MG PO CAPS: 75.0000 mg | ORAL_CAPSULE | Freq: Two times a day (BID) | ORAL | 0 refills | Status: DC

## 2017-06-21 NOTE — Progress Notes (Signed)
added tamiflu 75mg  bid for 5 days. Sent to [facility] [address].

## 2017-06-27 ENCOUNTER — Other Ambulatory Visit
Admission: RE | Admit: 2017-06-27 | Discharge: 2017-06-27 | Disposition: A | Discharge status: Home / Self Care | Payer: 59 | Source: Ambulatory Visit | Attending: Internal Medicine | Admitting: Internal Medicine

## 2017-06-27 DIAGNOSIS — E876 Hypokalemia: Secondary | ICD-10-CM | POA: Insufficient documentation

## 2017-06-27 LAB — BASIC METABOLIC PANEL
ANION GAP: 9 (ref 5–15)
BUN: 12 mg/dL (ref 6–20)
CALCIUM: 9.2 mg/dL (ref 8.9–10.3)
CO2: 29 mmol/L (ref 22–32)
Chloride: 98 mmol/L — ABNORMAL LOW (ref 101–111)
Creatinine, Ser: 0.86 mg/dL (ref 0.44–1.00)
GFR calc Af Amer: >60 mL/min (ref >60)
GFR calc non Af Amer: >60 mL/min (ref >60)
GLUCOSE: 83 mg/dL (ref 65–99)
Potassium: 4.2 mmol/L (ref 3.5–5.1)
Sodium: 136 mmol/L (ref 135–145)

## 2017-07-15 ENCOUNTER — Telehealth: Payer: Self-pay

## 2017-07-15 ENCOUNTER — Other Ambulatory Visit: Payer: Self-pay | Admitting: Nurse Practitioner

## 2017-07-15 DIAGNOSIS — J4521 Mild intermittent asthma with (acute) exacerbation: Secondary | ICD-10-CM

## 2017-07-15 DIAGNOSIS — J0110 Acute frontal sinusitis, unspecified: Secondary | ICD-10-CM

## 2017-07-15 MED ORDER — LEVOFLOXACIN 500 MG PO TABS: 500.0000 mg | ORAL_TABLET | Freq: Every day | ORAL | 0 refills | Status: DC

## 2017-07-15 MED ORDER — PREDNISONE 10 MG (21) PO TBPK: ORAL_TABLET | ORAL | 0 refills | Status: DC

## 2017-07-15 NOTE — Telephone Encounter (Signed)
-----   Message from Carlean JewsHeather E Boscia, NP sent at 07/15/2017  1:43 PM EST ----- Regarding: RE: patient has a hoarse and raspy voice I sent in new rx for levaquin and prednisone to [facility] on [address]. This year, this has been normal for people to have to take multiple rounds of antibiotics.   ----- Message ----- From: Cecilio AsperPatel, Samariah Hokenson Sent: 07/15/2017  10:37 AM To: Carlean JewsHeather E Boscia, NP Subject: patient has a hoarse and raspy voice           Pt called saying that since she had the flu, ear infection, and upper respiratory infection he voice is still hoarse and raspy. Also when she was drinking honey and tea her throat felt prickly and burned slightly when the tea went down. She also mentioned her ears are slightly hurting.   She also wants to know if this is normal. She has not had to take this many round of antibiotics before. She has been working overtime and wants to know if that can be a reason as well.

## 2017-07-15 NOTE — Telephone Encounter (Signed)
Pt has been notified.

## 2017-07-25 ENCOUNTER — Other Ambulatory Visit: Payer: Self-pay

## 2017-07-28 ENCOUNTER — Ambulatory Visit: Payer: 59 | Admitting: Nurse Practitioner

## 2017-07-28 ENCOUNTER — Encounter: Payer: Self-pay | Admitting: Nurse Practitioner

## 2017-07-28 VITALS — BP 129/86 | HR 74 | Resp 16 | Ht 62.0 in | Wt 228.0 lb

## 2017-07-28 DIAGNOSIS — J329 Chronic sinusitis, unspecified: Secondary | ICD-10-CM | POA: Diagnosis not present

## 2017-07-28 DIAGNOSIS — J4521 Mild intermittent asthma with (acute) exacerbation: Secondary | ICD-10-CM

## 2017-07-28 DIAGNOSIS — J040 Acute laryngitis: Secondary | ICD-10-CM

## 2017-07-28 DIAGNOSIS — J0110 Acute frontal sinusitis, unspecified: Secondary | ICD-10-CM | POA: Diagnosis not present

## 2017-07-28 DIAGNOSIS — K219 Gastro-esophageal reflux disease without esophagitis: Secondary | ICD-10-CM | POA: Diagnosis not present

## 2017-07-28 MED ORDER — LEVOFLOXACIN 500 MG PO TABS: 500.0000 mg | ORAL_TABLET | Freq: Every day | ORAL | 0 refills | Status: DC

## 2017-07-28 MED ORDER — PREDNISONE 10 MG (21) PO TBPK: ORAL_TABLET | ORAL | 0 refills | Status: DC

## 2017-07-28 MED ORDER — OMEPRAZOLE 20 MG PO CPDR: 20.0000 mg | DELAYED_RELEASE_CAPSULE | Freq: Every day | ORAL | 3 refills | Status: DC

## 2017-07-28 NOTE — Progress Notes (Signed)
[facility] [address] [place], [place] [address]  Internal MEDICINE  Office Visit Note  Patient Name: [name]  [id number]  [id number]  Date of Service: 07/29/2017  Chief Complaint  Patient presents with  . Laryngitis  . Ear Pain     The patient is here as sick visit. Has been treated thre times over past 6 weeks for sinusitis/bronchitis. Finished all antibiotics and steroid treatments. Some symptoms have been relieved. Breathing is better, no longer coughing or wheezing. She is having severe laryngitis. Right ear is very tender and her lymph nodes are very swollen. She is having difficulty swallowing food and pills. Feel as though they are getting hung up in right side of the throat.    URI   This is a recurrent problem. The current episode started 1 to 4 weeks ago. The problem has been gradually worsening. There has been no fever. Associated symptoms include congestion, coughing, ear pain, headaches, neck pain, a sore throat and swollen glands. Pertinent negatives include no chest pain, diarrhea, nausea, rhinorrhea, sinus pain, vomiting or wheezing. Associated symptoms comments: Severe hoarseness. She has tried inhaler use and acetaminophen (antibiotics, inhalers, and steroid treatments ) for the symptoms. The treatment provided mild relief.   Pt is here for a sick visit.     Current Medication:  Outpatient Encounter Medications as of 07/28/2017  Medication Sig  . albuterol (PROVENTIL HFA;VENTOLIN HFA) 108 (90 BASE) MCG/ACT inhaler Inhale 2 puffs into the lungs every 6 (six) hours as needed for wheezing or shortness of breath.  Marland Kitchen b complex vitamins tablet Take 1 tablet by mouth daily.  . budesonide-formoterol (SYMBICORT) 160-4.5 MCG/ACT inhaler Inhale 2 puffs into the lungs every 12 (twelve) hours.  . calcium-vitamin D (OSCAL WITH D) 500-200 MG-UNIT per tablet Take 1 tablet by mouth.  . chlorpheniramine-HYDROcodone (TUSSIONEX PENNKINETIC ER) 10-8 MG/5ML  SUER Take 5 mLs by mouth every 12 (twelve) hours as needed for cough.  . Cholecalciferol (VITAMIN D-3 PO) Take 5,000 Int'l Units by mouth daily.  . cyclobenzaprine (FLEXERIL) 10 MG tablet Take 1 tablet (10 mg total) by mouth 3 (three) times daily as needed for muscle spasms. (Patient not taking: Reported on 06/13/2017)  . docusate sodium (COLACE) 100 MG capsule Take 1 capsule (100 mg total) by mouth 2 (two) times daily.  . Ferrous Sulfate 27 MG TABS Take 1 tablet by mouth 2 (two) times daily.  Marland Kitchen ipratropium-albuterol (DUONEB) 0.5-2.5 (3) MG/3ML SOLN Take 3 mLs by nebulization every 4 (four) hours as needed.  Marland Kitchen levofloxacin (LEVAQUIN) 500 MG tablet Take 1 tablet (500 mg total) by mouth daily.  Marland Kitchen linaclotide (LINZESS) 145 MCG CAPS capsule Take 145 mcg by mouth daily. Take 1 cap with meal.  . lubiprostone (AMITIZA) 24 MCG capsule Take 24 mcg by mouth 2 (two) times daily with a meal.  . metoprolol (LOPRESSOR) 50 MG tablet Take 50 mg by mouth daily.  . montelukast (SINGULAIR) 10 MG tablet Take 10 mg by mouth daily.  Marland Kitchen omeprazole (PRILOSEC) 20 MG capsule Take 1 capsule (20 mg total) by mouth daily.  Marland Kitchen oseltamivir (TAMIFLU) 75 MG capsule Take 1 capsule (75 mg total) by mouth 2 (two) times daily.  Marland Kitchen oxyCODONE-acetaminophen (ROXICET) 5-325 MG tablet Take 1-2 tablets by mouth every 4 (four) hours as needed for severe pain. (Patient not taking: Reported on 06/13/2017)  . potassium chloride (K-DUR) 10 MEQ tablet Take 1 tablet (10 mEq total) by mouth daily.  . predniSONE (STERAPRED UNI-PAK 21 TAB) 10  MG (21) TBPK tablet 12 day taper - take by mouth as directed for 12 days  . promethazine (PHENERGAN) 12.5 MG tablet Take 1 tablet (12.5 mg total) by mouth every 6 (six) hours as needed for nausea or vomiting. (Patient not taking: Reported on 06/13/2017)  . [DISCONTINUED] levofloxacin (LEVAQUIN) 500 MG tablet Take 1 tablet (500 mg total) by mouth daily.  . [DISCONTINUED] predniSONE (STERAPRED UNI-PAK 21 TAB) 10 MG (21)  TBPK tablet 6 day taper - take by mouth as directed for 6 days   No facility-administered encounter medications on file as of 07/28/2017.       Medical History: Past Medical History:  Diagnosis Date  . Allergy    environmental  . Anemia   . Asthma   . Bronchitis, acute   . Complication of anesthesia    HAD ASTHMA ATTACK WHILE UNDER  . Hypertension   . Sleep apnea      Vital Signs: BP 129/86   Pulse 74   Resp 16   Ht 5\' 2"  (1.575 m)   Wt 228 lb (103.4 kg)   LMP 05/28/2015   SpO2 100%   BMI 41.70 kg/m    Review of Systems  Constitutional: Positive for fatigue. Negative for activity change and fever.       Low grade fever last Thursday. No fever since then .  HENT: Positive for congestion, ear pain, postnasal drip, sore throat and voice change. Negative for rhinorrhea and sinus pain.   Eyes: Negative.   Respiratory: Positive for cough. Negative for shortness of breath and wheezing.   Cardiovascular: Negative for chest pain and palpitations.  Gastrointestinal: Negative for diarrhea, nausea and vomiting.  Endocrine: Negative for cold intolerance, polydipsia, polyphagia and polyuria.  Musculoskeletal: Positive for neck pain and neck stiffness. Negative for arthralgias and back pain.  Skin: Negative.   Allergic/Immunologic: Positive for environmental allergies.  Neurological: Positive for headaches.  Hematological: Negative.   Psychiatric/Behavioral: Negative.     Physical Exam  Constitutional: She is oriented to person, place, and time. She appears well-developed and well-nourished.  HENT:  Head: Normocephalic.  Right Ear: There is swelling. Tympanic membrane is erythematous and bulging.  Left Ear: There is swelling. Tympanic membrane is erythematous and bulging.  Nose: Right sinus exhibits maxillary sinus tenderness. Left sinus exhibits maxillary sinus tenderness.  Mouth/Throat: Posterior oropharyngeal edema and posterior oropharyngeal erythema present.  Eyes:  Pupils are equal, round, and reactive to light.  Neck: Normal range of motion. Neck supple.  Cardiovascular: Normal rate, regular rhythm and normal heart sounds.  Pulmonary/Chest: Effort normal and breath sounds normal. She has no wheezes.  Abdominal: Soft. Bowel sounds are normal. There is no tenderness.  Musculoskeletal: Normal range of motion.  Lymphadenopathy:    She has cervical adenopathy.  Neurological: She is alert and oriented to person, place, and time.  Skin: Skin is warm and dry.  Psychiatric: She has a normal mood and affect. Her behavior is normal. Judgment and thought content normal.  Nursing note and vitals reviewed.  Assessment/Plan:  1. Acute non-recurrent frontal sinusitis - levofloxacin (LEVAQUIN) 500 MG tablet; Take 1 tablet (500 mg total) by mouth daily.  Dispense: 7 tablet; Refill: 0  2. Chronic recurrent sinusitis - Ambulatory referral to ENT  3. Laryngitis, acute Antibiotics to treat bacterial causes. Referral to ENT for further evaluation.   4. Gastroesophageal reflux disease without esophagitis Possible cause for significant hoarseness.  - omeprazole (PRILOSEC) 20 MG capsule; Take 1 capsule (20 mg total) by mouth  daily.  Dispense: 30 capsule; Refill: 3  5. Mild intermittent asthma with acute exacerbation in adult - predniSONE (STERAPRED UNI-PAK 21 TAB) 10 MG (21) TBPK tablet; 12 day taper - take by mouth as directed for 12 days  Dispense: 48 tablet; Refill: 0   General Counseling: Corona verbalizes understanding of the findings of todays visit and agrees with plan of treatment. I have discussed any further diagnostic evaluation that may be needed or ordered today. We also reviewed her medications today. she has been encouraged to call the office with any questions or concerns that should arise related to todays visit.   This patient was seen by [name] Gros, FNP- C in Collaboration with Dr Lyndon Code as a part of collaborative care  agreement    Orders Placed This Encounter  Procedures  . Ambulatory referral to ENT    Meds ordered this encounter  Medications  . levofloxacin (LEVAQUIN) 500 MG tablet    Sig: Take 1 tablet (500 mg total) by mouth daily.    Dispense:  7 tablet    Refill:  0    Order Specific Question:   Supervising Provider    Answer:   Lyndon Code [[id number]]  . predniSONE (STERAPRED UNI-PAK 21 TAB) 10 MG (21) TBPK tablet    Sig: 12 day taper - take by mouth as directed for 12 days    Dispense:  48 tablet    Refill:  0    Order Specific Question:   Supervising Provider    Answer:   Lyndon Code [[id number]]  . omeprazole (PRILOSEC) 20 MG capsule    Sig: Take 1 capsule (20 mg total) by mouth daily.    Dispense:  30 capsule    Refill:  3    Order Specific Question:   Supervising Provider    Answer:   Lyndon Code [[id number]]    Time spent: 20 Minutes

## 2017-08-03 DIAGNOSIS — K219 Gastro-esophageal reflux disease without esophagitis: Secondary | ICD-10-CM | POA: Diagnosis not present

## 2017-08-03 DIAGNOSIS — H6980 Other specified disorders of Eustachian tube, unspecified ear: Secondary | ICD-10-CM | POA: Diagnosis not present

## 2017-08-03 DIAGNOSIS — G4733 Obstructive sleep apnea (adult) (pediatric): Secondary | ICD-10-CM | POA: Diagnosis not present

## 2017-08-03 DIAGNOSIS — L040 Acute lymphadenitis of face, head and neck: Secondary | ICD-10-CM | POA: Diagnosis not present

## 2017-09-12 ENCOUNTER — Ambulatory Visit: Payer: Self-pay | Admitting: Nurse Practitioner

## 2017-10-05 ENCOUNTER — Other Ambulatory Visit: Payer: Self-pay

## 2017-10-05 DIAGNOSIS — E876 Hypokalemia: Secondary | ICD-10-CM

## 2017-10-05 MED ORDER — POTASSIUM CHLORIDE ER 10 MEQ PO TBCR: 10.0000 meq | EXTENDED_RELEASE_TABLET | Freq: Every day | ORAL | 3 refills | Status: DC

## 2017-10-07 ENCOUNTER — Ambulatory Visit: Payer: 59 | Admitting: Nurse Practitioner

## 2017-10-07 ENCOUNTER — Ambulatory Visit
Admission: RE | Admit: 2017-10-07 | Discharge: 2017-10-07 | Disposition: A | Discharge status: Home / Self Care | Payer: 59 | Source: Ambulatory Visit | Attending: Nurse Practitioner | Admitting: Nurse Practitioner

## 2017-10-07 VITALS — BP 126/94 | HR 74 | Resp 16 | Ht 62.0 in | Wt 229.8 lb

## 2017-10-07 DIAGNOSIS — M79672 Pain in left foot: Secondary | ICD-10-CM

## 2017-10-07 DIAGNOSIS — M7732 Calcaneal spur, left foot: Secondary | ICD-10-CM | POA: Diagnosis not present

## 2017-10-07 DIAGNOSIS — I10 Essential (primary) hypertension: Secondary | ICD-10-CM

## 2017-10-07 DIAGNOSIS — K219 Gastro-esophageal reflux disease without esophagitis: Secondary | ICD-10-CM | POA: Diagnosis not present

## 2017-10-07 DIAGNOSIS — J4521 Mild intermittent asthma with (acute) exacerbation: Secondary | ICD-10-CM | POA: Diagnosis not present

## 2017-10-07 NOTE — Progress Notes (Signed)
[facility] [address] [place], [place] [address]  Internal MEDICINE  Office Visit Note  Patient Name: [name]  [id number]  [id number]  Date of Service: 11/02/2017    Pt is here for routine follow up.   Chief Complaint  Patient presents with  . Gastroesophageal Reflux    follow up  . Foot Pain    right heel, back and bottom pain  . Hand Pain    tingling both hands    The patient is having tingling and numbness in both hands. Mostly affecting her thumb and first two fingers of both hands. Tingling can get so severe it will wake her up during the night.  She is also having a lot of pain in left heel. Hurts to put any weight on her foot. She has put heel cushion in the shoe on the left side. Does not help at all.  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 Uses CPAP and has to have download and compliance report for this physical      Current Medication: Outpatient Encounter Medications as of 10/07/2017  Medication Sig  . albuterol (PROVENTIL HFA;VENTOLIN HFA) 108 (90 BASE) MCG/ACT inhaler Inhale 2 puffs into the lungs every 6 (six) hours as needed for wheezing or shortness of breath.  Marland Kitchen b complex vitamins tablet Take 1 tablet by mouth daily.  . budesonide-formoterol (SYMBICORT) 160-4.5 MCG/ACT inhaler Inhale 2 puffs into the lungs every 12 (twelve) hours.  . calcium-vitamin D (OSCAL WITH D) 500-200 MG-UNIT per tablet Take 1 tablet by mouth.  . chlorpheniramine-HYDROcodone (TUSSIONEX PENNKINETIC ER) 10-8 MG/5ML SUER Take 5 mLs by mouth every 12 (twelve) hours as needed for cough.  . Cholecalciferol (VITAMIN D-3 PO) Take 5,000 Int'l Units by mouth daily.  . cyclobenzaprine (FLEXERIL) 10 MG tablet Take 1 tablet (10 mg total) by mouth 3 (three) times daily as needed for muscle spasms.  Marland Kitchen docusate sodium (COLACE) 100 MG capsule Take 1 capsule (100 mg total) by mouth 2 (two) times daily.  . Ferrous Sulfate 27 MG TABS Take 1 tablet by  mouth 2 (two) times daily.  Marland Kitchen ipratropium-albuterol (DUONEB) 0.5-2.5 (3) MG/3ML SOLN Take 3 mLs by nebulization every 4 (four) hours as needed.  Marland Kitchen levofloxacin (LEVAQUIN) 500 MG tablet Take 1 tablet (500 mg total) by mouth daily.  Marland Kitchen linaclotide (LINZESS) 145 MCG CAPS capsule Take 145 mcg by mouth daily. Take 1 cap with meal.  . lubiprostone (AMITIZA) 24 MCG capsule Take 24 mcg by mouth 2 (two) times daily with a meal.  . metoprolol (LOPRESSOR) 50 MG tablet Take 50 mg by mouth daily.  . montelukast (SINGULAIR) 10 MG tablet Take 10 mg by mouth daily.  Marland Kitchen omeprazole (PRILOSEC) 20 MG capsule Take 1 capsule (20 mg total) by mouth daily.  Marland Kitchen oseltamivir (TAMIFLU) 75 MG capsule Take 1 capsule (75 mg total) by mouth 2 (two) times daily.  Marland Kitchen oxyCODONE-acetaminophen (ROXICET) 5-325 MG tablet Take 1-2 tablets by mouth every 4 (four) hours as needed for severe pain.  . predniSONE (STERAPRED UNI-PAK 21 TAB) 10 MG (21) TBPK tablet 12 day taper - take by mouth as directed for 12 days  . promethazine (PHENERGAN) 12.5 MG tablet Take 1 tablet (12.5 mg total) by mouth every 6 (six) hours as needed for nausea or vomiting.  . [DISCONTINUED] potassium chloride (K-DUR) 10 MEQ tablet Take 1 tablet (10 mEq total) by mouth daily.   No facility-administered encounter medications on file as  of 10/07/2017.     Surgical History: Past Surgical History:  Procedure Laterality Date  . ABDOMINAL HYSTERECTOMY N/A 06/09/2015   Procedure: HYSTERECTOMY ABDOMINAL with Bilateral Salpingectomy;  Surgeon: Suzy Bouchard, MD;  Location: [facility] ORS;  Service: Gynecology;  Laterality: N/A;  . CESAREAN SECTION  [id number]  . FINGER SURGERY  1992  . GASTRIC BYPASS    . INGUINAL HERNIA REPAIR Right 2010  . REPLACEMENT TOTAL KNEE Right     Medical History: Past Medical History:  Diagnosis Date  . Allergy    environmental  . Anemia   . Asthma   . Bronchitis, acute   . Complication of anesthesia    HAD ASTHMA ATTACK WHILE UNDER  .  Gastroesophageal reflux disease without esophagitis 11/02/2017  . Hypertension   . Sleep apnea     Family History: Family History  Problem Relation Age of Onset  . Diabetes Mother   . Hypertension Mother   . Cancer Mother   . Congestive Heart Failure Father   . Diabetes Father     Social History   Socioeconomic History  . Marital status: Married    Spouse name: Not on file  . Number of children: Not on file  . Years of education: Not on file  . Highest education level: Not on file  Occupational History  . Not on file  Social Needs  . Financial resource strain: Not on file  . Food insecurity:    Worry: Not on file    Inability: Not on file  . Transportation needs:    Medical: Not on file    Non-medical: Not on file  Tobacco Use  . Smoking status: Never Smoker  . Smokeless tobacco: Never Used  Substance and Sexual Activity  . Alcohol use: No  . Drug use: No  . Sexual activity: Yes  Lifestyle  . Physical activity:    Days per week: Not on file    Minutes per session: Not on file  . Stress: Not on file  Relationships  . Social connections:    Talks on phone: Not on file    Gets together: Not on file    Attends religious service: Not on file    Active member of club or organization: Not on file    Attends meetings of clubs or organizations: Not on file    Relationship status: Not on file  . Intimate partner violence:    Fear of current or ex partner: Not on file    Emotionally abused: Not on file    Physically abused: Not on file    Forced sexual activity: Not on file  Other Topics Concern  . Not on file  Social History Narrative  . Not on file      Review of Systems  Constitutional: Positive for fatigue. Negative for activity change and fever.  HENT: Negative for congestion, ear pain, postnasal drip, rhinorrhea, sinus pain, sore throat and voice change.   Eyes: Negative.   Respiratory: Negative for cough, shortness of breath and wheezing.    Cardiovascular: Negative for chest pain and palpitations.  Gastrointestinal: Positive for constipation. Negative for diarrhea, nausea and vomiting.       Acid reflux  Endocrine: Negative for cold intolerance, heat intolerance, polydipsia, polyphagia and polyuria.  Musculoskeletal: Positive for arthralgias. Negative for back pain, neck pain and neck stiffness.       Pain left heel. Very bad when putting weight on her foot. Has tried using inserts in her showes to  cusion the heels, but this has not helped. Pain is getting worse.   Skin: Negative for rash.  Allergic/Immunologic: Positive for environmental allergies.  Neurological: Positive for headaches.  Hematological: Negative for adenopathy.  Psychiatric/Behavioral: Negative for decreased concentration and dysphoric mood. The patient is not nervous/anxious.     Today's Vitals   10/07/17 1452  BP: (!) 126/94  Pulse: 74  Resp: 16  SpO2: 100%  Weight: 229 lb 12.8 oz (104.2 kg)  Height:  (1.575 m)    Physical Exam  Constitutional: She is oriented to person, place, and time. She appears well-developed and well-nourished.  HENT:  Head: Normocephalic.  Right Ear: No swelling. Tympanic membrane is bulging. Tympanic membrane is not erythematous.  Left Ear: No swelling. Tympanic membrane is bulging. Tympanic membrane is not erythematous.  Nose: Nose normal. Right sinus exhibits no maxillary sinus tenderness. Left sinus exhibits no maxillary sinus tenderness.  Mouth/Throat: Oropharynx is clear and moist. No posterior oropharyngeal edema or posterior oropharyngeal erythema.  Eyes: Pupils are equal, round, and reactive to light.  Neck: Normal range of motion. Neck supple.  Cardiovascular: Normal rate, regular rhythm and normal heart sounds.  Pulmonary/Chest: Effort normal and breath sounds normal. She has no wheezes.  Abdominal: Soft. Bowel sounds are normal. There is no tenderness.  Musculoskeletal: Normal range of motion.   Lymphadenopathy:    She has no cervical adenopathy.  Neurological: She is alert and oriented to person, place, and time.  Skin: Skin is warm and dry.  Psychiatric: She has a normal mood and affect. Her behavior is normal. Judgment and thought content normal.  Nursing note and vitals reviewed.  Assessment/Plan: 1. Inflammatory pain of left heel Likely heel spur vs. Plantar fasciitis. Will get x-ray for further evaluation. Refer to podiatry as indicated.  - DG Foot Complete Left; Future  2. Gastroesophageal reflux disease without esophagitis Improved. Continue omeprazole as prescribed.   3. Mild intermittent asthma with acute exacerbation in adult Improved. Use inhalers as prescribed.   4. Essential hypertension Stable. Continue bp medication as prescribed.   General Counseling: [name] verbalizes understanding of the findings of todays visit and agrees with plan of treatment. I have discussed any further diagnostic evaluation that may be needed or ordered today. We also reviewed her medications today. she has been encouraged to call the office with any questions or concerns that should arise related to todays visit.    Counseling:  This patient was seen by [name] Gros, FNP- C in Collaboration with Dr Lyndon Code as a part of collaborative care agreement   Orders Placed This Encounter  Procedures  . DG Foot Complete Left      Time spent: 61 Minutes    Dr Lyndon Code Internal medicine

## 2017-10-10 ENCOUNTER — Telehealth: Payer: Self-pay

## 2017-10-10 NOTE — Telephone Encounter (Signed)
Left vm that about xray results and that we can refer to podiatry if pain persist.  dbs

## 2017-10-11 ENCOUNTER — Telehealth: Payer: Self-pay

## 2017-10-12 ENCOUNTER — Other Ambulatory Visit: Payer: Self-pay | Admitting: Nurse Practitioner

## 2017-10-12 DIAGNOSIS — E876 Hypokalemia: Secondary | ICD-10-CM

## 2017-10-12 MED ORDER — POTASSIUM CHLORIDE ER 10 MEQ PO TBCR: 10.0000 meq | EXTENDED_RELEASE_TABLET | Freq: Every day | ORAL | 3 refills | Status: DC

## 2017-10-20 DIAGNOSIS — M722 Plantar fascial fibromatosis: Secondary | ICD-10-CM | POA: Diagnosis not present

## 2017-10-26 ENCOUNTER — Ambulatory Visit: Payer: 59

## 2017-10-26 DIAGNOSIS — G4733 Obstructive sleep apnea (adult) (pediatric): Secondary | ICD-10-CM

## 2017-10-26 NOTE — Progress Notes (Signed)
95 percentile pressure 9   95th percentile leak 7.2   apnea index .04 hr  apnea-hypopnea index  0.6 /hr   total days used  >4 hr 52 days  total days used <4 hr 6 days  Total compliance 58 percent  Pt doing good has had a lot of allergies and is unable to use during high allergy. Since pt has went to ent dr doing better and able to use more.

## 2017-10-26 NOTE — Telephone Encounter (Signed)
Patient seen and downloads given to her for cpap compliance. [name]

## 2017-11-01 DIAGNOSIS — G4733 Obstructive sleep apnea (adult) (pediatric): Secondary | ICD-10-CM | POA: Diagnosis not present

## 2017-11-02 ENCOUNTER — Encounter: Payer: Self-pay | Admitting: Nurse Practitioner

## 2017-11-02 ENCOUNTER — Ambulatory Visit: Payer: Self-pay

## 2017-11-02 ENCOUNTER — Telehealth: Payer: Self-pay

## 2017-11-02 DIAGNOSIS — K219 Gastro-esophageal reflux disease without esophagitis: Secondary | ICD-10-CM

## 2017-11-02 DIAGNOSIS — J45909 Unspecified asthma, uncomplicated: Secondary | ICD-10-CM | POA: Insufficient documentation

## 2017-11-02 DIAGNOSIS — M79672 Pain in left foot: Secondary | ICD-10-CM | POA: Insufficient documentation

## 2017-11-02 DIAGNOSIS — I10 Essential (primary) hypertension: Secondary | ICD-10-CM | POA: Insufficient documentation

## 2017-11-02 DIAGNOSIS — E1159 Type 2 diabetes mellitus with other circulatory complications: Secondary | ICD-10-CM | POA: Insufficient documentation

## 2017-11-02 HISTORY — DX: Gastro-esophageal reflux disease without esophagitis: K21.9

## 2017-11-02 NOTE — Telephone Encounter (Signed)
Called patient to advise her that her cpap report for 90 days is ready for pickup/tat

## 2017-11-23 ENCOUNTER — Other Ambulatory Visit: Payer: Self-pay | Admitting: Internal Medicine

## 2017-12-14 DIAGNOSIS — J4520 Mild intermittent asthma, uncomplicated: Secondary | ICD-10-CM | POA: Diagnosis not present

## 2017-12-28 ENCOUNTER — Ambulatory Visit (INDEPENDENT_AMBULATORY_CARE_PROVIDER_SITE_OTHER): Payer: 59

## 2017-12-28 DIAGNOSIS — G4733 Obstructive sleep apnea (adult) (pediatric): Secondary | ICD-10-CM | POA: Diagnosis not present

## 2017-12-28 NOTE — Progress Notes (Signed)
95 percentile pressure 9   95th percentile leak 8.3   apnea index 0.5 /hr  apnea-hypopnea index  0.6 /hr   total days used  >4 hr 78 days  total days used <4 hr 3 days  Total compliance 87 percent  Patient doing very good. No problems or questions at this time

## 2018-01-07 ENCOUNTER — Other Ambulatory Visit: Payer: Self-pay | Admitting: Nurse Practitioner

## 2018-01-07 DIAGNOSIS — R059 Cough, unspecified: Secondary | ICD-10-CM

## 2018-01-07 DIAGNOSIS — R05 Cough: Secondary | ICD-10-CM

## 2018-01-07 DIAGNOSIS — J0110 Acute frontal sinusitis, unspecified: Secondary | ICD-10-CM

## 2018-01-07 MED ORDER — HYDROCOD POLST-CPM POLST ER 10-8 MG/5ML PO SUER: 5.0000 mL | Freq: Two times a day (BID) | ORAL | 0 refills | Status: DC | PRN

## 2018-01-07 MED ORDER — AZITHROMYCIN 250 MG PO TABS: ORAL_TABLET | ORAL | 0 refills | Status: DC

## 2018-01-07 NOTE — Progress Notes (Signed)
Patient called c/o congestion and cough. Sent z-pack. Take as directed for 5 days. Sent tussionex to relieve cough. Use 1 tsp twice daily as needed for cough. Both were sent to [facility] on [address].

## 2018-01-31 DIAGNOSIS — G4733 Obstructive sleep apnea (adult) (pediatric): Secondary | ICD-10-CM | POA: Diagnosis not present

## 2018-02-16 ENCOUNTER — Other Ambulatory Visit: Payer: Self-pay

## 2018-02-16 DIAGNOSIS — E876 Hypokalemia: Secondary | ICD-10-CM

## 2018-02-16 MED ORDER — POTASSIUM CHLORIDE ER 10 MEQ PO TBCR: 10.0000 meq | EXTENDED_RELEASE_TABLET | Freq: Every day | ORAL | 3 refills | Status: DC

## 2018-03-02 DIAGNOSIS — G4733 Obstructive sleep apnea (adult) (pediatric): Secondary | ICD-10-CM | POA: Diagnosis not present

## 2018-03-27 ENCOUNTER — Ambulatory Visit: Payer: Self-pay | Admitting: Internal Medicine

## 2018-04-03 DIAGNOSIS — G4733 Obstructive sleep apnea (adult) (pediatric): Secondary | ICD-10-CM | POA: Diagnosis not present

## 2018-04-12 ENCOUNTER — Encounter: Payer: Self-pay | Admitting: Adult Health

## 2018-04-12 ENCOUNTER — Ambulatory Visit: Payer: 59 | Admitting: Adult Health

## 2018-04-12 VITALS — BP 145/98 | HR 83 | Resp 16 | Ht 62.0 in | Wt 242.0 lb

## 2018-04-12 DIAGNOSIS — R5383 Other fatigue: Secondary | ICD-10-CM | POA: Diagnosis not present

## 2018-04-12 DIAGNOSIS — E162 Hypoglycemia, unspecified: Secondary | ICD-10-CM

## 2018-04-12 DIAGNOSIS — R7309 Other abnormal glucose: Secondary | ICD-10-CM

## 2018-04-12 DIAGNOSIS — K911 Postgastric surgery syndromes: Secondary | ICD-10-CM | POA: Diagnosis not present

## 2018-04-12 LAB — POCT CBG (FASTING - GLUCOSE)-MANUAL ENTRY: GLUCOSE FASTING, POC: 94 mg/dL (ref 70–99)

## 2018-04-12 NOTE — Progress Notes (Signed)
[facility] [address] [place], [place] [address]  Internal MEDICINE  Office Visit Note  Patient Name: [name]  [id number]  [id number]  Date of Service: 04/12/2018  Chief Complaint  Patient presents with  . Hypertension  . Gastroesophageal Reflux  . Sleep Apnea  . Dizziness    she check her glucose was low  . Numbness    on face  . Headache     HPI Pt is here for a sick visit.  She reports not feeling well periodically over the last few weeks.  She reports episodes of feeling flushed and numbness of her face.  She reports taking her blood sugar at these times and noticing that her blood sugar is low. The lowest blood sugar she has found is in the low 40's.  During the episode she reports some dizziness as well as headache at times.  She states that she eats something like peanut butter or something sugary and the symptoms appear to resolve.  Patient did have gastric bypass surgery a few years ago denies any complications with that.     Current Medication:  Outpatient Encounter Medications as of 04/12/2018  Medication Sig  . albuterol (PROVENTIL HFA;VENTOLIN HFA) 108 (90 BASE) MCG/ACT inhaler Inhale 2 puffs into the lungs every 6 (six) hours as needed for wheezing or shortness of breath.  Marland Kitchen azithromycin (ZITHROMAX) 250 MG tablet z-pack - take as directed for 5 days  . b complex vitamins tablet Take 1 tablet by mouth daily.  . budesonide-formoterol (SYMBICORT) 160-4.5 MCG/ACT inhaler Inhale 2 puffs into the lungs every 12 (twelve) hours.  . calcium-vitamin D (OSCAL WITH D) 500-200 MG-UNIT per tablet Take 1 tablet by mouth.  . chlorpheniramine-HYDROcodone (TUSSIONEX PENNKINETIC ER) 10-8 MG/5ML SUER Take 5 mLs by mouth every 12 (twelve) hours as needed for cough.  . Cholecalciferol (VITAMIN D-3 PO) Take 5,000 Int'l Units by mouth daily.  . cyclobenzaprine (FLEXERIL) 10 MG tablet Take 1 tablet (10 mg total) by mouth 3 (three) times daily as needed for  muscle spasms.  Marland Kitchen docusate sodium (COLACE) 100 MG capsule Take 1 capsule (100 mg total) by mouth 2 (two) times daily.  . Ferrous Sulfate 27 MG TABS Take 1 tablet by mouth 2 (two) times daily.  Marland Kitchen ipratropium-albuterol (DUONEB) 0.5-2.5 (3) MG/3ML SOLN Take 3 mLs by nebulization every 4 (four) hours as needed.  . linaclotide (LINZESS) 145 MCG CAPS capsule Take 145 mcg by mouth daily. Take 1 cap with meal.  . metoprolol (LOPRESSOR) 50 MG tablet Take 50 mg by mouth daily.  . metoprolol succinate (TOPROL-XL) 50 MG 24 hr tablet TAKE 1 TABLET BY MOUTH IN THE MORNING FOR BLOOD PRESSURE  . montelukast (SINGULAIR) 10 MG tablet Take 10 mg by mouth daily.  Marland Kitchen omeprazole (PRILOSEC) 20 MG capsule Take 1 capsule (20 mg total) by mouth daily.  . potassium chloride (K-DUR) 10 MEQ tablet Take 1 tablet (10 mEq total) by mouth daily.  . promethazine (PHENERGAN) 12.5 MG tablet Take 1 tablet (12.5 mg total) by mouth every 6 (six) hours as needed for nausea or vomiting.  . [DISCONTINUED] lubiprostone (AMITIZA) 24 MCG capsule Take 24 mcg by mouth 2 (two) times daily with a meal.  . [DISCONTINUED] oseltamivir (TAMIFLU) 75 MG capsule Take 1 capsule (75 mg total) by mouth 2 (two) times daily. (Patient not taking: Reported on 04/12/2018)  . [DISCONTINUED] oxyCODONE-acetaminophen (ROXICET) 5-325 MG tablet Take 1-2 tablets by mouth every 4 (four) hours as needed for severe pain. (  Patient not taking: Reported on 04/12/2018)  . [DISCONTINUED] predniSONE (STERAPRED UNI-PAK 21 TAB) 10 MG (21) TBPK tablet 12 day taper - take by mouth as directed for 12 days (Patient not taking: Reported on 04/12/2018)   No facility-administered encounter medications on file as of 04/12/2018.       Medical History: Past Medical History:  Diagnosis Date  . Allergy    environmental  . Anemia   . Asthma   . Bronchitis, acute   . Complication of anesthesia    HAD ASTHMA ATTACK WHILE UNDER  . Gastroesophageal reflux disease without esophagitis  11/02/2017  . Hypertension   . Sleep apnea      Vital Signs: BP (!) 145/98 (BP Location: Left Arm, Patient Position: Sitting, Cuff Size: Large)   Pulse 83   Resp 16   Ht 5\' 2"  (1.575 m)   Wt 242 lb (109.8 kg)   LMP 05/28/2015   SpO2 98%   BMI 44.26 kg/m    Review of Systems  Constitutional: Negative for chills, fatigue and unexpected weight change.  HENT: Negative for congestion, rhinorrhea, sneezing and sore throat.   Eyes: Negative for photophobia, pain and redness.  Respiratory: Negative for cough, chest tightness and shortness of breath.   Cardiovascular: Negative for chest pain and palpitations.  Gastrointestinal: Negative for abdominal pain, constipation, diarrhea, nausea and vomiting.  Endocrine: Negative.   Genitourinary: Negative for dysuria and frequency.  Musculoskeletal: Negative for arthralgias, back pain, joint swelling and neck pain.  Skin: Negative for rash.  Allergic/Immunologic: Negative.   Neurological: Negative for tremors and numbness.  Hematological: Negative for adenopathy. Does not bruise/bleed easily.  Psychiatric/Behavioral: Negative for behavioral problems and sleep disturbance. The patient is not nervous/anxious.     Physical Exam  Constitutional: She is oriented to person, place, and time. She appears well-developed and well-nourished. No distress.  HENT:  Head: Normocephalic and atraumatic.  Mouth/Throat: Oropharynx is clear and moist. No oropharyngeal exudate.  Eyes: Pupils are equal, round, and reactive to light. EOM are normal.  Neck: Normal range of motion. Neck supple. No JVD present. No tracheal deviation present. No thyromegaly present.  Cardiovascular: Normal rate, regular rhythm and normal heart sounds. Exam reveals no gallop and no friction rub.  No murmur heard. Pulmonary/Chest: Effort normal and breath sounds normal. No respiratory distress. She has no wheezes. She has no rales. She exhibits no tenderness.  Abdominal: Soft. There is  no tenderness. There is no guarding.  Musculoskeletal: Normal range of motion.  Lymphadenopathy:    She has no cervical adenopathy.  Neurological: She is alert and oriented to person, place, and time. No cranial nerve deficit.  Skin: Skin is warm and dry. She is not diaphoretic.  Psychiatric: She has a normal mood and affect. Her behavior is normal. Judgment and thought content normal.  Nursing note and vitals reviewed.  Assessment/Plan: 1. Postsurgical dumping syndrome Patient's blood sugar issues typically happen with 1 hour after eating.  This quite possibly could be postsurgical dumping syndrome from her gastric bypass surgery.  She will be keeping a food journal until our next visit and also records of her blood sugars in relation to those foods.  We discussed avoiding foods that are high in carbohydrates or simple sugars and see if her symptoms will improve.  2. Low glucose level We will repeat lab work to see if there are any changes since last withdrawal. - POCT CBG (Fasting - Glucose) - CBC with Differential/Platelet - TSH - T4, free -  Comprehensive metabolic panel  3. Fatigue, unspecified type Fatigue labs sent also as patient reports just not feeling right lately. - B12 and Folate Panel - Vitamin D 1,25 dihydroxy - Fe+TIBC+Fer  General Counseling: [name] verbalizes understanding of the findings of todays visit and agrees with plan of treatment. I have discussed any further diagnostic evaluation that may be needed or ordered today. We also reviewed her medications today. she has been encouraged to call the office with any questions or concerns that should arise related to todays visit.   Orders Placed This Encounter  Procedures  . CBC with Differential/Platelet  . TSH  . T4, free  . Comprehensive metabolic panel  . B12 and Folate Panel  . Vitamin D 1,25 dihydroxy  . Fe+TIBC+Fer  . POCT CBG (Fasting - Glucose)    No orders of the defined types were placed in this  encounter.   Time spent: 30 Minutes  This patient was seen by Blima Ledger AGNP-C in Collaboration with Dr Lyndon Code as a part of collaborative care agreement.  [name] AGNP-C Internal Medicine

## 2018-04-12 NOTE — Patient Instructions (Signed)
Dumping Syndrome Diet  Dumping syndrome is a term that is used to describe a group of symptoms that occur when the stomach empties too quickly. Changing how and what you eat can often help to control these symptoms.  What is my plan?  Your health care provider or dietitian may recommend specific changes to your diet to help prevent and treat symptoms. General recommendations include:  · Eating 5-6 small meals each day.  · Limiting fluid intake during meals to 4 oz.  · Eating a balanced diet.  ? Grains: 6-8 oz per day.  ? Vegetables: 2-3 cups per day.  ? Fruits: 1-2 cups per day.  ? Meats and other protein sources: 5-6 oz per day.  ? Dairy: 2-3 cups per day.    What do I need to know about a dumping syndrome diet?  · Avoid drinking liquids for 30 minutes before meals.  · Avoid drinking liquids for at least 30 minutes after meals.  · Lie down for 15-30 minutes after meals. This will help to prevent lightheadedness and will slow down the emptying of your stomach.  · Eat smaller portions of food.  · Cut food into small pieces and chew thoroughly before you swallow.  · Try to make sure that at least half of your daily servings of grains come from whole grains. These foods are high in fiber, which helps to slow down digestion.  · Eat more complex carbohydrates, such as whole grains, vegetables, and beans.  · Avoid eating simple carbohydrates and foods that have added sugar, such as fruit juices, white bread, pastries, and sweets. Foods that are high in sugar are especially likely to bring about symptoms.  · With each meal and snack, eat one food that is high in protein.  · Avoid dairy products if they aggravate your symptoms. Try lactose-free dairy products instead.  · Avoid drinking alcohol.  · Take a fiber supplement if recommended by your health care provider.  · Increase the thickness of foods by adding plant-based thickening agents to them, such as pectin.  · Avoid eating very hot or very cold foods. They may  aggravate your symptoms.  What foods can I eat?  Grains  Whole-grain bread. Brown rice. Whole-grain pasta. Corn tortillas. Multigrain crackers.  Vegetables  All fresh, frozen, and canned vegetables.  Fruits  All fresh, frozen, and canned fruits that are packed in water. Unsweetened applesauce.  Meats and Other Protein Sources  Lean meats. Chicken. Fish. Eggs. Tofu. Deli meat. Nuts and seeds. Unsweetened nut butters. Beans.  Dairy  Low-fat or fat-free milk. Low-fat plain yogurt. Light yogurt. Cheese. Lactose-free dairy products.  Beverages  Water. Sugar-free, noncarbonated soft drinks. Unsweetened tea. Unsweetened coffee. Sugar-free hot chocolate. Unsweetened soy, almond, or rice milk.  Condiments  Sugar substitutes. Sugar-free jam and sugar-free jelly. Sugar-free syrup.  Sweets and Desserts  Sugar-free gelatin. Sugar-free pudding. Sugar-free ice cream.  Fats and Oils  Butter. Vegetable oil. Salad dressings. Mayonnaise. Avocado. Cream cheese.  Other  Broth. All spices and herbs. Over-the-counter fiber supplements.  The items listed above may not be a complete list of recommended foods or beverages. Contact your dietitian for more options.  What foods are not recommended?  Grains  Cereals with added sugar. Pastries. Cakes. Sweet and white breads. Flavored instant oatmeal.  Vegetables  Breaded vegetables.  Fruits  Fruits that are dried with added sugar. Canned fruit in syrup. Fruit juice.  Meats and Other Protein Sources  Breaded meats. Meats that are cooked   with glazes or sweetened sauces.  Dairy  Flavored milk. Sugar-sweetened yogurt. Milkshakes.  Beverages  Hot chocolate. Regular sugar carbonated beverages. Diet carbonated beverages. Sweetened coconut milk. Sweetened coffee drinks.  Condiments  Honey. Sugar. Syrup. Jam and jelly. Molasses. Agave nectar.  Sweets and Desserts  Cakes. Candies. Ice Cream. Donuts. Puddings. Pies. Chocolate.  Fats and Oils  Frostings. Sweetened nut butters.  Other  Candied nuts. Candied  fruit.  The items listed above may not be a complete list of foods and beverages to avoid. Contact your dietitian for more information.  This information is not intended to replace advice given to you by your health care provider. Make sure you discuss any questions you have with your health care provider.  Document Released: 05/06/2011 Document Revised: 10/23/2015 Document Reviewed: 12/18/2013  Elsevier Interactive Patient Education © 2018 [employer] Inc[employer]

## 2018-04-15 ENCOUNTER — Other Ambulatory Visit
Admission: RE | Admit: 2018-04-15 | Discharge: 2018-04-15 | Disposition: A | Discharge status: Home / Self Care | Payer: 59 | Source: Ambulatory Visit | Attending: Adult Health | Admitting: Adult Health

## 2018-04-15 DIAGNOSIS — K911 Postgastric surgery syndromes: Secondary | ICD-10-CM | POA: Insufficient documentation

## 2018-04-15 DIAGNOSIS — R5383 Other fatigue: Secondary | ICD-10-CM | POA: Diagnosis not present

## 2018-04-15 DIAGNOSIS — E162 Hypoglycemia, unspecified: Secondary | ICD-10-CM | POA: Diagnosis not present

## 2018-04-15 LAB — COMPREHENSIVE METABOLIC PANEL
ALBUMIN: 3.6 g/dL (ref 3.5–5.0)
ALK PHOS: 77 U/L (ref 38–126)
ALT: 21 U/L (ref 0–44)
ANION GAP: 9 (ref 5–15)
AST: 18 U/L (ref 15–41)
BUN: 10 mg/dL (ref 6–20)
CHLORIDE: 104 mmol/L (ref 98–111)
CO2: 28 mmol/L (ref 22–32)
Calcium: 9 mg/dL (ref 8.9–10.3)
Creatinine, Ser: 0.7 mg/dL (ref 0.44–1.00)
GFR calc Af Amer: >60 mL/min (ref >60)
GFR calc non Af Amer: >60 mL/min (ref >60)
GLUCOSE: 96 mg/dL (ref 70–99)
POTASSIUM: 3.4 mmol/L — AB (ref 3.5–5.1)
Sodium: 141 mmol/L (ref 135–145)
Total Bilirubin: 1 mg/dL (ref 0.3–1.2)
Total Protein: 7.8 g/dL (ref 6.5–8.1)

## 2018-04-15 LAB — CBC WITH DIFFERENTIAL/PLATELET
Abs Immature Granulocytes: 0.01 10*3/uL (ref 0.00–0.07)
BASOS PCT: 1 %
Basophils Absolute: 0 10*3/uL (ref 0.0–0.1)
Eosinophils Absolute: 0.2 10*3/uL (ref 0.0–0.5)
Eosinophils Relative: 5 %
HCT: 42.2 % (ref 36.0–46.0)
HEMOGLOBIN: 14 g/dL (ref 12.0–15.0)
Immature Granulocytes: 0 %
LYMPHS PCT: 39 %
Lymphs Abs: 1.8 10*3/uL (ref 0.7–4.0)
MCH: 30.7 pg (ref 26.0–34.0)
MCHC: 33.2 g/dL (ref 30.0–36.0)
MCV: 92.5 fL (ref 80.0–100.0)
Monocytes Absolute: 0.4 10*3/uL (ref 0.1–1.0)
Monocytes Relative: 8 %
NRBC: 0 % (ref 0.0–0.2)
Neutro Abs: 2.1 10*3/uL (ref 1.7–7.7)
Neutrophils Relative %: 47 %
Platelets: 232 10*3/uL (ref 150–400)
RBC: 4.56 MIL/uL (ref 3.87–5.11)
RDW: 12.7 % (ref 11.5–15.5)
WBC: 4.6 10*3/uL (ref 4.0–10.5)

## 2018-04-15 LAB — FOLATE: Folate: 25 ng/mL (ref >5.9)

## 2018-04-15 LAB — IRON AND TIBC
Iron: 99 ug/dL (ref 28–170)
SATURATION RATIOS: 23 % (ref 10.4–31.8)
TIBC: 429 ug/dL (ref 250–450)
UIBC: 330 ug/dL

## 2018-04-15 LAB — TSH: TSH: 1.318 u[IU]/mL (ref 0.350–4.500)

## 2018-04-15 LAB — T4, FREE: FREE T4: 0.75 ng/dL — AB (ref 0.82–1.77)

## 2018-04-15 LAB — FERRITIN: FERRITIN: 30 ng/mL (ref 11–307)

## 2018-04-15 LAB — VITAMIN B12: Vitamin B-12: 2613 pg/mL — ABNORMAL HIGH (ref 180–914)

## 2018-04-17 ENCOUNTER — Other Ambulatory Visit: Payer: Self-pay | Admitting: Nurse Practitioner

## 2018-04-18 LAB — VITAMIN D 25 HYDROXY (VIT D DEFICIENCY, FRACTURES): VIT D 25 HYDROXY: 43 ng/mL (ref 30.0–100.0)

## 2018-04-20 ENCOUNTER — Ambulatory Visit: Payer: Self-pay | Admitting: Internal Medicine

## 2018-04-26 ENCOUNTER — Ambulatory Visit: Payer: Self-pay | Admitting: Adult Health

## 2018-04-26 ENCOUNTER — Ambulatory Visit: Payer: Self-pay

## 2018-05-03 DIAGNOSIS — G4733 Obstructive sleep apnea (adult) (pediatric): Secondary | ICD-10-CM | POA: Diagnosis not present

## 2018-05-05 ENCOUNTER — Ambulatory Visit: Payer: Self-pay | Admitting: Adult Health

## 2018-05-10 ENCOUNTER — Other Ambulatory Visit: Payer: Self-pay | Admitting: Internal Medicine

## 2018-05-12 ENCOUNTER — Other Ambulatory Visit: Payer: Self-pay | Admitting: Nurse Practitioner

## 2018-06-02 DIAGNOSIS — G4733 Obstructive sleep apnea (adult) (pediatric): Secondary | ICD-10-CM | POA: Diagnosis not present

## 2018-06-05 ENCOUNTER — Emergency Department: Payer: 59

## 2018-06-05 ENCOUNTER — Emergency Department
Admission: EM | Admit: 2018-06-05 | Discharge: 2018-06-05 | Disposition: A | Discharge status: Home / Self Care | Payer: 59 | Attending: Emergency Medicine | Admitting: Emergency Medicine

## 2018-06-05 ENCOUNTER — Encounter: Payer: Self-pay | Admitting: Intensive Care

## 2018-06-05 ENCOUNTER — Other Ambulatory Visit: Payer: Self-pay

## 2018-06-05 DIAGNOSIS — S92501A Displaced unspecified fracture of right lesser toe(s), initial encounter for closed fracture: Secondary | ICD-10-CM | POA: Diagnosis not present

## 2018-06-05 DIAGNOSIS — I10 Essential (primary) hypertension: Secondary | ICD-10-CM | POA: Insufficient documentation

## 2018-06-05 DIAGNOSIS — Y929 Unspecified place or not applicable: Secondary | ICD-10-CM | POA: Diagnosis not present

## 2018-06-05 DIAGNOSIS — M7989 Other specified soft tissue disorders: Secondary | ICD-10-CM | POA: Diagnosis not present

## 2018-06-05 DIAGNOSIS — Z96651 Presence of right artificial knee joint: Secondary | ICD-10-CM | POA: Diagnosis not present

## 2018-06-05 DIAGNOSIS — Y9301 Activity, walking, marching and hiking: Secondary | ICD-10-CM | POA: Diagnosis not present

## 2018-06-05 DIAGNOSIS — S99921A Unspecified injury of right foot, initial encounter: Secondary | ICD-10-CM | POA: Diagnosis not present

## 2018-06-05 DIAGNOSIS — S8001XA Contusion of right knee, initial encounter: Secondary | ICD-10-CM | POA: Diagnosis not present

## 2018-06-05 DIAGNOSIS — Y999 Unspecified external cause status: Secondary | ICD-10-CM | POA: Insufficient documentation

## 2018-06-05 DIAGNOSIS — Z79899 Other long term (current) drug therapy: Secondary | ICD-10-CM | POA: Insufficient documentation

## 2018-06-05 DIAGNOSIS — S92511A Displaced fracture of proximal phalanx of right lesser toe(s), initial encounter for closed fracture: Secondary | ICD-10-CM | POA: Diagnosis not present

## 2018-06-05 DIAGNOSIS — J45909 Unspecified asthma, uncomplicated: Secondary | ICD-10-CM | POA: Insufficient documentation

## 2018-06-05 DIAGNOSIS — W2209XA Striking against other stationary object, initial encounter: Secondary | ICD-10-CM | POA: Diagnosis not present

## 2018-06-05 DIAGNOSIS — M79674 Pain in right toe(s): Secondary | ICD-10-CM | POA: Diagnosis not present

## 2018-06-05 MED ORDER — TRAMADOL HCL 50 MG PO TABS: 50.0000 mg | ORAL_TABLET | Freq: Four times a day (QID) | ORAL | 0 refills | Status: DC | PRN

## 2018-06-05 NOTE — Discharge Instructions (Signed)
Follow-up with Dr. Ether Griffins who is the podiatrist on call if you continue to have problems with your toe.  Use buddy tape and tape your fourth and fifth toe together for support and protection.  Wear the postop shoe which will prevent you from bending her toes.  Ice and elevation today.  You may take tramadol every 6 hours as needed for pain.  You may also take Tylenol along with this if extra pain medication is needed.  The healing time for this type of fracture is approximately 4 to 6 weeks.

## 2018-06-05 NOTE — ED Provider Notes (Signed)
[facility] Emergency Department Provider Note  ____________________________________________   First MD Initiated Contact with Patient 06/05/18 [phone]     (approximate)  I have reviewed the triage vital signs and the nursing notes.   HISTORY  Chief Complaint Toe Pain and Knee Pain (right)   HPI [name] is a 48 y.o. female presents to the ED with complaint of right toe pain after she hit a door frame and also fell in 2 the door frame striking her right knee.  She complains of both areas hurting.  She states her right fifth toe is worse however she is continued to have knee pain.  Patient is status post total knee replacement and since that time has had a fracture due to trauma in this area as well.  Denies any head injury during this event this morning.  Currently she rates her pain as 10/10.  Past Medical History:  Diagnosis Date  . Allergy    environmental  . Anemia   . Asthma   . Bronchitis, acute   . Complication of anesthesia    HAD ASTHMA ATTACK WHILE UNDER  . Gastroesophageal reflux disease without esophagitis 11/02/2017  . Hypertension   . Sleep apnea     Patient Active Problem List   Diagnosis Date Noted  . Inflammatory pain of left heel 11/02/2017  . Gastroesophageal reflux disease without esophagitis 11/02/2017  . Asthma in adult 11/02/2017  . Essential hypertension 11/02/2017  . Postoperative state 06/09/2015  . 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 ABDOMINAL HYSTERECTOMY N/A 06/09/2015   Procedure: HYSTERECTOMY ABDOMINAL with Bilateral Salpingectomy;  Surgeon: Suzy Bouchard, MD;  Location: [facility] ORS;  Service: Gynecology;  Laterality: N/A;  . CESAREAN SECTION  [id number]  . FINGER SURGERY  1992  . GASTRIC BYPASS    . INGUINAL HERNIA REPAIR Right 2010  . REPLACEMENT TOTAL KNEE Right     Prior to Admission medications   Medication Sig Start Date End Date Taking? Authorizing  Provider  albuterol (PROVENTIL HFA;VENTOLIN HFA) 108 (90 BASE) MCG/ACT inhaler Inhale 2 puffs into the lungs every 6 (six) hours as needed for wheezing or shortness of breath.    [provider]  b complex vitamins tablet Take 1 tablet by mouth daily.    [provider]  budesonide-formoterol (SYMBICORT) 160-4.5 MCG/ACT inhaler Inhale 2 puffs into the lungs every 12 (twelve) hours.    [provider]  calcium-vitamin D (OSCAL WITH D) 500-200 MG-UNIT per tablet Take 1 tablet by mouth.    [provider]  Cholecalciferol (VITAMIN D-3 PO) Take 5,000 Int'l Units by mouth daily.    [provider]  Ferrous Sulfate 27 MG TABS Take 1 tablet by mouth 2 (two) times daily.    [provider]  hydrochlorothiazide (HYDRODIURIL) 12.5 MG tablet TAKE 1 TABLET BY MOUTH ONCE DAILY 05/10/18   Johnna Acosta, NP  ipratropium-albuterol (DUONEB) 0.5-2.5 (3) MG/3ML SOLN Take 3 mLs by nebulization every 4 (four) hours as needed.    [provider]  linaclotide (LINZESS) 145 MCG CAPS capsule Take 145 mcg by mouth daily. Take 1 cap with meal.    [provider]  metoprolol (LOPRESSOR) 50 MG tablet Take 50 mg by mouth daily.    [provider]  metoprolol succinate (TOPROL-XL) 50 MG 24 hr tablet TAKE 1 TABLET BY MOUTH IN THE MORNING FOR BLOOD PRESSURE 11/23/17   Carlean Jews, NP  montelukast (SINGULAIR) 10 MG tablet Take 10 mg by mouth daily.    [provider]  omeprazole (PRILOSEC) 20 MG capsule Take 1 capsule (20 mg total) by mouth daily. 07/28/17   Carlean JewsBoscia, Heather E, NP  potassium chloride (K-DUR) 10 MEQ tablet Take 1 tablet (10 mEq total) by mouth daily. 02/16/18   Carlean JewsBoscia, Heather E, NP  traMADol (ULTRAM) 50 MG tablet Take 1 tablet (50 mg total) by mouth every 6 (six) hours as needed. 06/05/18   Tommi RumpsSummers, Rhonda L, PA-C    Allergies Patient has no known allergies.  Family History  Problem Relation Age of Onset  . Diabetes  Mother   . Hypertension Mother   . Cancer Mother   . Congestive Heart Failure Father   . Diabetes Father     Social History Social History   Tobacco Use  . Smoking status: Never Smoker  . Smokeless tobacco: Never Used  Substance Use Topics  . Alcohol use: No  . Drug use: No    Review of Systems Constitutional: No fever/chills Cardiovascular: Denies chest pain. Respiratory: Denies shortness of breath. Musculoskeletal: Positive for right fifth toe pain, right knee pain. Skin: Negative for rash. Neurological: Negative for headaches, focal weakness or numbness. ____________________________________________   PHYSICAL EXAM:  VITAL SIGNS: ED Triage Vitals  Enc Vitals Group     BP 06/05/18 0810 133/82     Pulse Rate 06/05/18 0810 88     Resp 06/05/18 0810 20     Temp 06/05/18 0810 98.3 F (36.8 C)     Temp Source 06/05/18 0810 Oral     SpO2 06/05/18 0810 100 %     Weight 06/05/18 0811 247 lb (112 kg)     Height 06/05/18 0811 5\' 2"  (1.575 m)     Head Circumference --      Peak Flow --      Pain Score 06/05/18 0813 10     Pain Loc --      Pain Edu? --      Excl. in GC? --    Constitutional: Alert and oriented. Well appearing and in no acute distress. Eyes: Conjunctivae are normal.  Head: Atraumatic. Nose: No trauma. Neck: No stridor.  No cervical tenderness on palpation posteriorly.  Range of motion is that restriction. Cardiovascular: Normal rate, regular rhythm. Grossly normal heart sounds.  Good peripheral circulation. Respiratory: Normal respiratory effort.  No retractions. Lungs CTAB. Gastrointestinal: Soft and nontender. No distention.  Musculoskeletal: On examination of the right lower extremity there is no gross deformity noted however there is moderate tenderness on palpation of the right fifth toe.  Skin is intact.  There is some soft tissue edema.  No injury to the nail.  Nontender to palpation of the right ankle or anterior tib-fib.  There is tenderness  generalized over the right knee.  There is a well-healed surgical scar anteriorly without any abrasions or drainage noted.  Range of motion is restricted secondary to patient's pain.  No soft tissue edema or abrasions were seen. Neurologic:  Normal speech and language. No gross focal neurologic deficits are appreciated.  Skin:  Skin is warm, dry and intact.  No ecchymosis or abrasions were seen. Psychiatric: Mood and affect are normal. Speech and behavior are normal.  ____________________________________________   LABS (all labs ordered are listed, but only abnormal results are displayed)  Labs Reviewed - No data to display  RADIOLOGY  Official radiology report(s): Dg Knee Complete 4 Views Right  Result Date: 06/05/2018 CLINICAL  DATA:  Recent fall with right knee pain, initial encounter EXAM: RIGHT KNEE - COMPLETE 4+ VIEW COMPARISON:  12/19/2012 FINDINGS: Right knee replacement is noted. No acute fracture or dislocation is noted. No joint effusion is seen. IMPRESSION: Postoperative change without acute abnormality. Electronically Signed   By: Alcide CleverMark  Lukens M.D.   On: 06/05/2018 09:04   Dg Toe 5th Right  Result Date: 06/05/2018 CLINICAL DATA:  Blunt trauma with toe pain, initial encounter EXAM: RIGHT FIFTH TOE COMPARISON:  None. FINDINGS: There is a minimally displaced fracture at the distal aspect of the fifth proximal phalanx. Some irregularity is also noted at the base of the fifth proximal phalanx which may represent an undisplaced fracture. Considerable soft tissue swelling is noted. IMPRESSION: Changes in the fifth proximal phalanx as described consistent with fracture. Associated soft tissue swelling is noted. Electronically Signed   By: Alcide CleverMark  Lukens M.D.   On: 06/05/2018 09:03   ____________________________________________   PROCEDURES  Procedure(s) performed: None  Procedures  Critical Care performed: No  ____________________________________________   INITIAL IMPRESSION /  ASSESSMENT AND PLAN / ED COURSE  As part of my medical decision making, I reviewed the following data within the electronic MEDICAL RECORD NUMBER Notes from prior ED visits and [place] Controlled Substance Database  48 year old female presents to the ED after she fell forward into a door jam this morning injuring her right fifth toe and hitting her right knee.  She is concerned as she is status post total knee replacement several years ago.  Patient also has had a moderate tenderness to her fifth toe and x-ray reveals that she does have a fracture to the proximal phalanx.  Skin is intact.  Patient was made aware and buddy tape was applied along with a postop shoe.  She was reassured that the knee did not show any acute fractures.  Patient is encouraged to ice and elevate her foot today.  She was given a prescription for tramadol 1 every 6 hours as needed for pain.  She is to follow-up with Dr. Ether GriffinsFowler who is on-call for podiatry should she continue to have problems with her foot.  Patient was given a note to remain out of work today.  ____________________________________________   FINAL CLINICAL IMPRESSION(S) / ED DIAGNOSES  Final diagnoses:  Closed fracture of phalanx of right fifth toe, initial encounter  Contusion of right knee, initial encounter     ED Discharge Orders         Ordered    traMADol (ULTRAM) 50 MG tablet  Every 6 hours PRN     06/05/18 0941           Note:  This document was prepared using Dragon voice recognition software and may include unintentional dictation errors.    Tommi RumpsSummers, Rhonda L, PA-C 06/05/18 1108    Sharman CheekStafford, Phillip, MD 06/12/18 [phone]

## 2018-06-05 NOTE — ED Notes (Signed)
See triage note  States she stubbed her toe and then twisted her right knee to get from falling   States pain is mainly worse to 5 th toe

## 2018-06-05 NOTE — ED Triage Notes (Signed)
Patient reports stubbing her Right pinky toe today and having pain. Also c/o Right knee pain when she tried to catch herself while stubbing toe. HX right knee surgery 4-5 years ago

## 2018-06-05 NOTE — ED Triage Notes (Signed)
First RN: Pt states stumbled and now c/o R knee pain and R "pinky toe" pain. Pt states hx of R knee replacement.

## 2018-06-08 ENCOUNTER — Ambulatory Visit: Payer: Self-pay | Admitting: Internal Medicine

## 2018-06-13 ENCOUNTER — Other Ambulatory Visit: Payer: Self-pay | Admitting: Nurse Practitioner

## 2018-06-28 ENCOUNTER — Ambulatory Visit: Payer: Self-pay

## 2018-06-30 ENCOUNTER — Other Ambulatory Visit: Payer: Self-pay | Admitting: Nurse Practitioner

## 2018-07-04 DIAGNOSIS — G4733 Obstructive sleep apnea (adult) (pediatric): Secondary | ICD-10-CM | POA: Diagnosis not present

## 2018-07-06 ENCOUNTER — Ambulatory Visit: Payer: Self-pay | Admitting: Internal Medicine

## 2018-07-19 ENCOUNTER — Other Ambulatory Visit: Payer: Self-pay

## 2018-07-19 MED ORDER — METOPROLOL SUCCINATE ER 50 MG PO TB24: ORAL_TABLET | ORAL | 6 refills | Status: DC

## 2018-08-02 ENCOUNTER — Ambulatory Visit: Payer: Self-pay

## 2018-08-10 ENCOUNTER — Ambulatory Visit: Payer: Self-pay | Admitting: Internal Medicine

## 2018-08-15 ENCOUNTER — Ambulatory Visit: Payer: BC Managed Care – PPO | Admitting: Adult Health

## 2018-08-15 ENCOUNTER — Encounter: Payer: Self-pay | Admitting: Adult Health

## 2018-08-15 VITALS — BP 142/110 | HR 84 | Temp 97.9°F | Resp 16 | Ht 62.0 in | Wt 252.0 lb

## 2018-08-15 DIAGNOSIS — J0110 Acute frontal sinusitis, unspecified: Secondary | ICD-10-CM

## 2018-08-15 DIAGNOSIS — J4521 Mild intermittent asthma with (acute) exacerbation: Secondary | ICD-10-CM

## 2018-08-15 DIAGNOSIS — R05 Cough: Secondary | ICD-10-CM

## 2018-08-15 DIAGNOSIS — I10 Essential (primary) hypertension: Secondary | ICD-10-CM | POA: Diagnosis not present

## 2018-08-15 DIAGNOSIS — R059 Cough, unspecified: Secondary | ICD-10-CM

## 2018-08-15 MED ORDER — AMOXICILLIN-POT CLAVULANATE 875-125 MG PO TABS: 1.0000 | ORAL_TABLET | Freq: Two times a day (BID) | ORAL | 0 refills | Status: DC

## 2018-08-15 MED ORDER — IPRATROPIUM-ALBUTEROL 0.5-2.5 (3) MG/3ML IN SOLN: 3.0000 mL | RESPIRATORY_TRACT | 0 refills | Status: DC | PRN

## 2018-08-15 NOTE — Progress Notes (Signed)
[facility] [address] [place], [place] [address]  Internal MEDICINE  Office Visit Note  Patient Name: [name]  [id number]  [id number]  Date of Service: 08/16/2018  Chief Complaint  Patient presents with  . Sinusitis    daytime sinus medications   . Sore Throat  . Wheezing  . Shortness of Breath     HPI Pt is here for a sick visit. She reports sinus pain and pressure with PND.  She also reports sore throat, wheezing and sob. She reports she has been using multi-symptoms day/night medications.  BP is elevated 142/110.  Pt reports she has been taking her medication, but also has been using OTC decongestants.    Current Medication:  Outpatient Encounter Medications as of 08/15/2018  Medication Sig  . albuterol (PROVENTIL HFA;VENTOLIN HFA) 108 (90 BASE) MCG/ACT inhaler Inhale 2 puffs into the lungs every 6 (six) hours as needed for wheezing or shortness of breath.  Marland Kitchen b complex vitamins tablet Take 1 tablet by mouth daily.  . budesonide-formoterol (SYMBICORT) 160-4.5 MCG/ACT inhaler Inhale 2 puffs into the lungs every 12 (twelve) hours.  . calcium-vitamin D (OSCAL WITH D) 500-200 MG-UNIT per tablet Take 1 tablet by mouth.  . Cholecalciferol (VITAMIN D-3 PO) Take 5,000 Int'l Units by mouth daily.  . Ferrous Sulfate 27 MG TABS Take 1 tablet by mouth 2 (two) times daily.  . hydrochlorothiazide (HYDRODIURIL) 12.5 MG tablet TAKE 1 TABLET BY MOUTH ONCE DAILY  . ipratropium-albuterol (DUONEB) 0.5-2.5 (3) MG/3ML SOLN Take 3 mLs by nebulization every 4 (four) hours as needed.  . linaclotide (LINZESS) 145 MCG CAPS capsule Take 145 mcg by mouth daily. Take 1 cap with meal.  . metoprolol (LOPRESSOR) 50 MG tablet Take 50 mg by mouth daily.  . metoprolol succinate (TOPROL-XL) 50 MG 24 hr tablet TAKE 1 TABLET BY MOUTH IN THE MORNING FOR BLOOD PRESSURE  . montelukast (SINGULAIR) 10 MG tablet Take 10 mg by mouth daily.  Marland Kitchen omeprazole (PRILOSEC) 20 MG capsule Take 1 capsule  (20 mg total) by mouth daily.  . potassium chloride (K-DUR) 10 MEQ tablet Take 1 tablet (10 mEq total) by mouth daily.  . traMADol (ULTRAM) 50 MG tablet Take 1 tablet (50 mg total) by mouth every 6 (six) hours as needed.  . [DISCONTINUED] ipratropium-albuterol (DUONEB) 0.5-2.5 (3) MG/3ML SOLN Take 3 mLs by nebulization every 4 (four) hours as needed.  Marland Kitchen amoxicillin-clavulanate (AUGMENTIN) 875-125 MG tablet Take 1 tablet by mouth 2 (two) times daily.   No facility-administered encounter medications on file as of 08/15/2018.       Medical History: Past Medical History:  Diagnosis Date  . Allergy    environmental  . Anemia   . Asthma   . Bronchitis, acute   . Complication of anesthesia    HAD ASTHMA ATTACK WHILE UNDER  . Gastroesophageal reflux disease without esophagitis 11/02/2017  . Hypertension   . Sleep apnea      Vital Signs: BP (!) 142/110   Pulse 84   Temp 97.9 F (36.6 C)   Resp 16   Ht  (1.575 m)   Wt 252 lb (114.3 kg)   LMP 05/28/2015   SpO2 90%   BMI 46.09 kg/m    Review of Systems  Constitutional: Negative for chills, fatigue and unexpected weight change.  HENT: Positive for postnasal drip, sinus pressure, sinus pain and sore throat. Negative for congestion, rhinorrhea and sneezing.   Eyes: Negative for photophobia, pain and redness.  Respiratory:  Positive for shortness of breath and wheezing. Negative for cough and chest tightness.   Cardiovascular: Negative for chest pain and palpitations.  Gastrointestinal: Negative for abdominal pain, constipation, diarrhea, nausea and vomiting.  Endocrine: Negative.   Genitourinary: Negative for dysuria and frequency.  Musculoskeletal: Negative for arthralgias, back pain, joint swelling and neck pain.  Skin: Negative for rash.  Allergic/Immunologic: Negative.   Neurological: Negative for tremors and numbness.  Hematological: Negative for adenopathy. Does not bruise/bleed easily.  Psychiatric/Behavioral: Negative  for behavioral problems and sleep disturbance. The patient is not nervous/anxious.     Physical Exam Vitals signs and nursing note reviewed.  Constitutional:      General: She is not in acute distress.    Appearance: She is well-developed. She is not diaphoretic.  HENT:     Head: Normocephalic and atraumatic.     Mouth/Throat:     Pharynx: No oropharyngeal exudate.  Eyes:     Pupils: Pupils are equal, round, and reactive to light.  Neck:     Musculoskeletal: Normal range of motion and neck supple.     Thyroid: No thyromegaly.     Vascular: No JVD.     Trachea: No tracheal deviation.  Cardiovascular:     Rate and Rhythm: Normal rate and regular rhythm.     Heart sounds: Normal heart sounds. No murmur. No friction rub. No gallop.   Pulmonary:     Effort: Pulmonary effort is normal. No respiratory distress.     Breath sounds: Normal breath sounds. No wheezing or rales.  Chest:     Chest wall: No tenderness.  Abdominal:     Palpations: Abdomen is soft.     Tenderness: There is no abdominal tenderness. There is no guarding.  Musculoskeletal: Normal range of motion.  Lymphadenopathy:     Cervical: No cervical adenopathy.  Skin:    General: Skin is warm and dry.  Neurological:     Mental Status: She is alert and oriented to person, place, and time.     Cranial Nerves: No cranial nerve deficit.  Psychiatric:        Behavior: Behavior normal.        Thought Content: Thought content normal.        Judgment: Judgment normal.    Assessment/Plan: 1. Acute non-recurrent frontal sinusitis Advised patient to take entire course of antibiotics as prescribed with food. Pt should return to clinic in 7-10 days if symptoms fail to improve or new symptoms develop.  - amoxicillin-clavulanate (AUGMENTIN) 875-125 MG tablet; Take 1 tablet by mouth 2 (two) times daily.  Dispense: 20 tablet; Refill: 0  2. Mild intermittent asthma with acute exacerbation in adult Continue to use duoneb as  prescribed.  - ipratropium-albuterol (DUONEB) 0.5-2.5 (3) MG/3ML SOLN; Take 3 mLs by nebulization every 4 (four) hours as needed.  Dispense: 360 mL; Refill: 0  3. Cough Most likely due to PND and drainage.  Instructed patient to call clinic if her cough becomes productive of she develops a fever over 100.5  4. Essential hypertension Pts bp elevated 142/110.  Pt has been taking OTC medications for symptoms and likely this is is culprit.  Instructed patient to buy coracdin brand medications for symptoms management and to recheck her bp.  She currently denies chest pain, sob, palpitations or headaches.  If she develops any of these symptoms or her bp remains elevated she should rtc or go to hospital.   General Counseling: Remonia verbalizes understanding of the findings of todays visit  and agrees with plan of treatment. I have discussed any further diagnostic evaluation that may be needed or ordered today. We also reviewed her medications today. she has been encouraged to call the office with any questions or concerns that should arise related to todays visit.   No orders of the defined types were placed in this encounter.   Meds ordered this encounter  Medications  . ipratropium-albuterol (DUONEB) 0.5-2.5 (3) MG/3ML SOLN    Sig: Take 3 mLs by nebulization every 4 (four) hours as needed.    Dispense:  360 mL    Refill:  0  . amoxicillin-clavulanate (AUGMENTIN) 875-125 MG tablet    Sig: Take 1 tablet by mouth 2 (two) times daily.    Dispense:  20 tablet    Refill:  0    Time spent: 25 Minutes  This patient was seen by Blima Ledger AGNP-C in Collaboration with Dr Lyndon Code as a part of collaborative care agreement.  Johnna Acosta AGNP-C Internal Medicine

## 2018-08-25 ENCOUNTER — Other Ambulatory Visit: Payer: Self-pay | Admitting: Nurse Practitioner

## 2018-08-25 DIAGNOSIS — J0110 Acute frontal sinusitis, unspecified: Secondary | ICD-10-CM

## 2018-08-25 MED ORDER — PREDNISONE 10 MG (21) PO TBPK: ORAL_TABLET | ORAL | 0 refills | Status: DC

## 2018-08-25 MED ORDER — PREDNISONE 10 MG (21) PO TBPK: 10.0000 mg | ORAL_TABLET | Freq: Every day | ORAL | 0 refills | Status: DC

## 2018-08-25 NOTE — Telephone Encounter (Signed)
Patient called and states that her left ear is still hurting, she finished her antibiotic, and using flonase and allergy meds, she states that Herbert Seta normally gives her a prednisone for her ear infections and would like one . Per Herbert Seta ok to send

## 2018-09-04 DIAGNOSIS — G4733 Obstructive sleep apnea (adult) (pediatric): Secondary | ICD-10-CM | POA: Diagnosis not present

## 2018-09-13 ENCOUNTER — Ambulatory Visit (INDEPENDENT_AMBULATORY_CARE_PROVIDER_SITE_OTHER): Payer: BC Managed Care – PPO | Admitting: Nurse Practitioner

## 2018-09-13 ENCOUNTER — Encounter: Payer: Self-pay | Admitting: Nurse Practitioner

## 2018-09-13 ENCOUNTER — Other Ambulatory Visit: Payer: Self-pay

## 2018-09-13 VITALS — Temp 97.8°F | Resp 16 | Ht 62.0 in | Wt 251.0 lb

## 2018-09-13 DIAGNOSIS — J0141 Acute recurrent pansinusitis: Secondary | ICD-10-CM | POA: Diagnosis not present

## 2018-09-13 DIAGNOSIS — I10 Essential (primary) hypertension: Secondary | ICD-10-CM

## 2018-09-13 DIAGNOSIS — J4521 Mild intermittent asthma with (acute) exacerbation: Secondary | ICD-10-CM

## 2018-09-13 MED ORDER — AZITHROMYCIN 250 MG PO TABS: ORAL_TABLET | ORAL | 0 refills | Status: DC

## 2018-09-13 MED ORDER — PREDNISONE 10 MG (21) PO TBPK: ORAL_TABLET | ORAL | 0 refills | Status: DC

## 2018-09-13 NOTE — Progress Notes (Signed)
[facility] [address] [place], [place] [address]  Internal MEDICINE  Telephone Visit  Patient Name: [name]  [id number]  [id number]  Date of Service: 09/13/2018  I connected with the patient at 9:16am by telephone and verified the patients identity using two identifiers.   I discussed the limitations, risks, security and privacy concerns of performing an evaluation and management service by telephone and the availability of in person appointments. I also discussed with the patient that there may be a patient responsible charge related to the service.  The patient expressed understanding and agrees to proceed.    Chief Complaint  Patient presents with  . Telephone Assessment  . Telephone Screen  . Ear Pain    left   . Sinusitis    The patient has been contacted via telephone for follow up visit due to concerns for spread of novel coronavirus. The patient still having pain in left ear, radiates down to the jaw. Her lymph nodes are swollen, especially on the left side. States that she feels very congested, mostly on the left side of her head, feels like her head is full of fluid. She was treated with augmentin  twice daily. She finished the round of antibiotic. Did round of prednisone after that. She states that antibiotics did not reallly help too much. Has stayed congested with headache and left ear pain. She has not had to use rescue inhaler or nebulizzer since she finished round prednisone. She denies sore throat, fever, chills, or body aches. Her blood pressure has been elevated, as she has been taking oral nasal decongestants. This was problem when she was initially seen, however, she has not been able to find coricidin to help with congestion.       Current Medication: Outpatient Encounter Medications as of 09/13/2018  Medication Sig  . albuterol (PROVENTIL HFA;VENTOLIN HFA) 108 (90 BASE) MCG/ACT inhaler Inhale 2 puffs into the lungs every 6 (six) hours  as needed for wheezing or shortness of breath.  Marland Kitchen b complex vitamins tablet Take 1 tablet by mouth daily.  . budesonide-formoterol (SYMBICORT) 160-4.5 MCG/ACT inhaler Inhale 2 puffs into the lungs every 12 (twelve) hours.  . calcium-vitamin D (OSCAL WITH D) 500-200 MG-UNIT per tablet Take 1 tablet by mouth.  . Cholecalciferol (VITAMIN D-3 PO) Take 5,000 Int'l Units by mouth daily.  . Ferrous Sulfate 27 MG TABS Take 1 tablet by mouth 2 (two) times daily.  . hydrochlorothiazide (HYDRODIURIL) 12.5 MG tablet TAKE 1 TABLET BY MOUTH ONCE DAILY  . ipratropium-albuterol (DUONEB) 0.5-2.5 (3) MG/3ML SOLN Take 3 mLs by nebulization every 4 (four) hours as needed.  . lubiprostone (AMITIZA) 24 MCG capsule Take 24 mcg by mouth 2 (two) times daily with a meal.  . metoprolol succinate (TOPROL-XL) 50 MG 24 hr tablet TAKE 1 TABLET BY MOUTH IN THE MORNING FOR BLOOD PRESSURE  . montelukast (SINGULAIR) 10 MG tablet Take 10 mg by mouth daily.  Marland Kitchen omeprazole (PRILOSEC) 40 MG capsule Take 40 mg by mouth daily.  . potassium chloride (K-DUR) 10 MEQ tablet Take 1 tablet (10 mEq total) by mouth daily.  . [DISCONTINUED] omeprazole (PRILOSEC) 20 MG capsule Take 1 capsule (20 mg total) by mouth daily.  Marland Kitchen azithromycin (ZITHROMAX) 250 MG tablet z-pack - take as directed for 5 days  . predniSONE (STERAPRED UNI-PAK 21 TAB) 10 MG (21) TBPK tablet 6 day taper - take by mouth as directed for 6 days  . traMADol (ULTRAM) 50 MG tablet Take 1 tablet (  50 mg total) by mouth every 6 (six) hours as needed. (Patient not taking: Reported on 09/13/2018)  . [DISCONTINUED] amoxicillin-clavulanate (AUGMENTIN) 875-125 MG tablet Take 1 tablet by mouth 2 (two) times daily.  . [DISCONTINUED] linaclotide (LINZESS) 145 MCG CAPS capsule Take 145 mcg by mouth daily. Take 1 cap with meal.  . [DISCONTINUED] metoprolol (LOPRESSOR) 50 MG tablet Take 50 mg by mouth daily.  . [DISCONTINUED] predniSONE (STERAPRED UNI-PAK 21 TAB) 10 MG (21) TBPK tablet Take by  mouth as directed   No facility-administered encounter medications on file as of 09/13/2018.     Surgical History: Past Surgical History:  Procedure Laterality Date  . ABDOMINAL HYSTERECTOMY N/A 06/09/2015   Procedure: HYSTERECTOMY ABDOMINAL with Bilateral Salpingectomy;  Surgeon: Suzy Bouchard, MD;  Location: [facility] ORS;  Service: Gynecology;  Laterality: N/A;  . CESAREAN SECTION  [id number]  . FINGER SURGERY  1992  . GASTRIC BYPASS    . INGUINAL HERNIA REPAIR Right 2010  . REPLACEMENT TOTAL KNEE Right     Medical History: Past Medical History:  Diagnosis Date  . Allergy    environmental  . Anemia   . Asthma   . Bronchitis, acute   . Complication of anesthesia    HAD ASTHMA ATTACK WHILE UNDER  . Gastroesophageal reflux disease without esophagitis 11/02/2017  . Hypertension   . Sleep apnea     Family History: Family History  Problem Relation Age of Onset  . Diabetes Mother   . Hypertension Mother   . Cancer Mother   . Congestive Heart Failure Father   . Diabetes Father     Social History   Socioeconomic History  . Marital status: Married    Spouse name: Not on file  . Number of children: Not on file  . Years of education: Not on file  . Highest education level: Not on file  Occupational History  . Not on file  Social Needs  . Financial resource strain: Not on file  . Food insecurity:    Worry: Not on file    Inability: Not on file  . Transportation needs:    Medical: Not on file    Non-medical: Not on file  Tobacco Use  . Smoking status: Never Smoker  . Smokeless tobacco: Never Used  Substance and Sexual Activity  . Alcohol use: No  . Drug use: No  . Sexual activity: Yes  Lifestyle  . Physical activity:    Days per week: Not on file    Minutes per session: Not on file  . Stress: Not on file  Relationships  . Social connections:    Talks on phone: Not on file    Gets together: Not on file    Attends religious service: Not on file    Active  member of club or organization: Not on file    Attends meetings of clubs or organizations: Not on file    Relationship status: Not on file  . Intimate partner violence:    Fear of current or ex partner: Not on file    Emotionally abused: Not on file    Physically abused: Not on file    Forced sexual activity: Not on file  Other Topics Concern  . Not on file  Social History Narrative  . Not on file      Review of Systems  Constitutional: Positive for fatigue. Negative for chills, fever and unexpected weight change.  HENT: Positive for ear pain, postnasal drip, sinus pressure and sinus pain. Negative  for congestion, rhinorrhea, sneezing and sore throat.   Respiratory: Positive for cough and wheezing. Negative for chest tightness and shortness of breath.   Cardiovascular: Negative for chest pain and palpitations.  Gastrointestinal: Negative for abdominal pain, constipation, diarrhea, nausea and vomiting.  Genitourinary: Negative for dysuria and frequency.  Musculoskeletal: Negative for arthralgias, back pain, joint swelling and neck pain.  Skin: Negative for rash.  Allergic/Immunologic: Positive for environmental allergies.  Neurological: Positive for headaches. Negative for tremors and numbness.  Hematological: Negative for adenopathy. Does not bruise/bleed easily.  Psychiatric/Behavioral: Negative for behavioral problems and sleep disturbance. The patient is not nervous/anxious.    Today's Vitals   09/13/18 0901  Resp: 16  Temp: 97.8 F (36.6 C)  Weight: 251 lb (113.9 kg)  Height: 5\' 2"  (1.575 m)   Body mass index is 45.91 kg/m.  Observation/Objective:  The patient is alert and oriented. She does sound nasally congested. There is no wheezing or cough appreciated today. She is in no acute distress.    Assessment/Plan:  1. Acute recurrent pansinusitis Start z-pack. Take as directed for 5 days. Add prednisone 10mg  dose pack. Take as directed for 6 days. Rest and increase  fluids. Recommend she take OTC medication to improve acute symptoms.  - azithromycin (ZITHROMAX) 250 MG tablet; z-pack - take as directed for 5 days  Dispense: 6 tablet; Refill: 0 - predniSONE (STERAPRED UNI-PAK 21 TAB) 10 MG (21) TBPK tablet; 6 day taper - take by mouth as directed for 6 days  Dispense: 21 tablet; Refill: 0  2. Essential hypertension Generally stable. Advised her no to use nasal decongestants, as these can increase blood pressure. Advised her to try OTC coricidin. If unable to find, she should aske pharmacist for assistance.   3. Mild intermittent asthma with acute exacerbation in adult Continue to use inhalers and neb treatments as needed and as prescribed   General Counseling: Tamella verbalizes understanding of the findings of today's phone visit and agrees with plan of treatment. I have discussed any further diagnostic evaluation that may be needed or ordered today. We also reviewed her medications today. she has been encouraged to call the office with any questions or concerns that should arise related to todays visit.  Rest and increase fluids. Continue using OTC medication to control symptoms.   This patient was seen by [name] GrosHeather Wilkins Elpers FNP Collaboration with Dr Lyndon CodeFozia M Khan as a part of collaborative care agreement  Meds ordered this encounter  Medications  . azithromycin (ZITHROMAX) 250 MG tablet    Sig: z-pack - take as directed for 5 days    Dispense:  6 tablet    Refill:  0    Order Specific Question:   Supervising Provider    Answer:   Lyndon CodeKHAN, FOZIA M [[id number]]  . predniSONE (STERAPRED UNI-PAK 21 TAB) 10 MG (21) TBPK tablet    Sig: 6 day taper - take by mouth as directed for 6 days    Dispense:  21 tablet    Refill:  0    Order Specific Question:   Supervising Provider    Answer:   Lyndon CodeKHAN, FOZIA M [[id number]]    Time spent: 725 Minutes    Dr Lyndon CodeFozia M Khan Internal medicine

## 2018-09-25 ENCOUNTER — Ambulatory Visit: Payer: BC Managed Care – PPO | Admitting: Adult Health

## 2018-09-25 ENCOUNTER — Other Ambulatory Visit: Payer: Self-pay

## 2018-10-04 DIAGNOSIS — G4733 Obstructive sleep apnea (adult) (pediatric): Secondary | ICD-10-CM | POA: Diagnosis not present

## 2018-11-06 ENCOUNTER — Other Ambulatory Visit: Payer: Self-pay

## 2018-11-06 ENCOUNTER — Other Ambulatory Visit: Payer: Self-pay | Admitting: Internal Medicine

## 2018-11-06 DIAGNOSIS — E876 Hypokalemia: Secondary | ICD-10-CM

## 2018-11-06 DIAGNOSIS — G4733 Obstructive sleep apnea (adult) (pediatric): Secondary | ICD-10-CM | POA: Diagnosis not present

## 2018-11-06 MED ORDER — POTASSIUM CHLORIDE ER 10 MEQ PO TBCR: 10.0000 meq | EXTENDED_RELEASE_TABLET | Freq: Every day | ORAL | 3 refills | Status: DC

## 2018-11-16 ENCOUNTER — Telehealth: Payer: Self-pay | Admitting: *Deleted

## 2018-11-16 ENCOUNTER — Other Ambulatory Visit: Payer: Self-pay

## 2018-11-16 ENCOUNTER — Encounter: Payer: Self-pay | Admitting: Internal Medicine

## 2018-11-16 ENCOUNTER — Ambulatory Visit: Payer: BC Managed Care – PPO | Admitting: Internal Medicine

## 2018-11-16 DIAGNOSIS — Z20828 Contact with and (suspected) exposure to other viral communicable diseases: Secondary | ICD-10-CM | POA: Diagnosis not present

## 2018-11-16 DIAGNOSIS — I10 Essential (primary) hypertension: Secondary | ICD-10-CM

## 2018-11-16 DIAGNOSIS — E039 Hypothyroidism, unspecified: Secondary | ICD-10-CM

## 2018-11-16 DIAGNOSIS — R635 Abnormal weight gain: Secondary | ICD-10-CM | POA: Diagnosis not present

## 2018-11-16 DIAGNOSIS — Z20822 Contact with and (suspected) exposure to covid-19: Secondary | ICD-10-CM

## 2018-11-16 NOTE — Progress Notes (Signed)
[facility] [address] [place], [place] [address]  Internal MEDICINE  Telephone Visit  Patient Name: Jessica BristleCrystal E [name]  [id number]  [id number]  Date of Service: 11/16/2018  I connected with the patient at 1005 by telephone and verified the patients identity using two identifiers.   I discussed the limitations, risks, security and privacy concerns of performing an evaluation and management service by telephone and the availability of in person appointments. I also discussed with the patient that there may be a patient responsible charge related to the service.  The patient expressed understanding and agrees to proceed.    Chief Complaint  Patient presents with  . Medical Management of Chronic Issues    Pt exposure to covid19   HPI  Pt is connected via webcam to prevent risk of pandemic of covid19. 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 albuterol (PROVENTIL HFA;VENTOLIN HFA) 108 (90 BASE) MCG/ACT inhaler Inhale 2 puffs into the lungs every 6 (six) hours as needed for wheezing or shortness of breath.  Marland Kitchen. b complex vitamins tablet Take 1 tablet by mouth daily.  . budesonide-formoterol (SYMBICORT) 160-4.5 MCG/ACT inhaler Inhale 2 puffs into the lungs every 12 (twelve) hours.  . calcium-vitamin D (OSCAL WITH D) 500-200 MG-UNIT per tablet Take 1 tablet by mouth.  . Cholecalciferol (VITAMIN D-3 PO) Take 5,000 Int'l Units by mouth daily.  Marland Kitchen. ELDERBERRY PO Take by mouth.  . Ferrous Sulfate 27 MG TABS Take 1 tablet by mouth 2 (two) times daily.  . hydrochlorothiazide  (HYDRODIURIL) 12.5 MG tablet TAKE 1 TABLET BY MOUTH ONCE DAILY  . ipratropium-albuterol (DUONEB) 0.5-2.5 (3) MG/3ML SOLN Take 3 mLs by nebulization every 4 (four) hours as needed.  Boris Lown. Krill Oil (OMEGA-3) 500 MG CAPS Take by mouth.  . lubiprostone (AMITIZA) 24 MCG capsule Take 24 mcg by mouth 2 (two) times daily with a meal.  . metoprolol succinate (TOPROL-XL) 50 MG 24 hr tablet TAKE 1 TABLET BY MOUTH IN THE MORNING FOR BLOOD PRESSURE  . montelukast (SINGULAIR) 10 MG tablet Take 10 mg by mouth daily.  Marland Kitchen. omeprazole (PRILOSEC) 40 MG capsule Take 40 mg by mouth daily.  . potassium chloride (K-DUR) 10 MEQ tablet Take 1 tablet (10 mEq total) by mouth daily.  . [DISCONTINUED] azithromycin (ZITHROMAX) 250 MG tablet z-pack - take as directed for 5 days (Patient not taking: Reported on 11/16/2018)  . [DISCONTINUED] predniSONE (STERAPRED UNI-PAK 21 TAB) 10 MG (21) TBPK tablet 6 day taper - take by mouth as directed for 6 days (Patient not taking: Reported on 11/16/2018)  . [DISCONTINUED] traMADol (ULTRAM) 50 MG tablet Take 1 tablet (50 mg total) by mouth every 6 (six) hours as needed. (Patient not taking: Reported on 11/16/2018)   No facility-administered encounter medications on file as of 11/16/2018.     Surgical History: Past Surgical History:  Procedure Laterality Date  . ABDOMINAL HYSTERECTOMY N/A 06/09/2015   Procedure: HYSTERECTOMY ABDOMINAL with Bilateral Salpingectomy;  Surgeon: Suzy Bouchardhomas J Schermerhorn, MD;  Location: [facility] ORS;  Service: Gynecology;  Laterality: N/A;  . CESAREAN SECTION  [id number]  . FINGER SURGERY  1992  . GASTRIC BYPASS    . INGUINAL HERNIA REPAIR Right 2010  . REPLACEMENT TOTAL KNEE Right    Medical History: Past Medical History:  Diagnosis Date  . Allergy    environmental  . Anemia   . Asthma   . Bronchitis, acute   . Complication of anesthesia    HAD ASTHMA ATTACK WHILE UNDER  . Gastroesophageal reflux disease without esophagitis 11/02/2017  . Hypertension   . Sleep  apnea    Family History: Family History  Problem Relation Age of Onset  . Diabetes Mother   . Hypertension Mother   . Cancer Mother   . Congestive Heart Failure Father   . Diabetes Father     Social History   Socioeconomic History  . Marital status: Married    Spouse name: Not on file  . Number of children: Not on file  . Years of education: Not on file  . Highest education level: Not on file  Occupational History  . Not on file  Social Needs  . Financial resource strain: Not on file  . Food insecurity    Worry: Not on file    Inability: Not on file  . Transportation needs    Medical: Not on file    Non-medical: Not on file  Tobacco Use  . Smoking status: Never Smoker  . Smokeless tobacco: Never Used  Substance and Sexual Activity  . Alcohol use: No  . Drug use: No  . Sexual activity: Yes  Lifestyle  . Physical activity    Days per week: Not on file    Minutes per session: Not on file  . Stress: Not on file  Relationships  . Social Musicianconnections    Talks on phone: Not on file    Gets together: Not on file    Attends religious service: Not on file    Active member of club or organization: Not on file    Attends meetings of clubs or organizations: Not on file    Relationship status: Not on file  . Intimate partner violence    Fear of current or ex partner: Not on file    Emotionally abused: Not on file    Physically abused: Not on file    Forced sexual activity: Not on file  Other Topics Concern  . Not on file  Social History Narrative  . Not on file   Review of Systems  Constitutional: Positive for unexpected weight change. Negative for chills, diaphoresis and fatigue.  HENT: Negative for ear pain, postnasal drip and sinus pressure.   Eyes: Negative.  Negative for photophobia, discharge, redness, itching and visual disturbance.  Respiratory: Negative for cough, shortness of breath and wheezing.   Cardiovascular: Positive for leg swelling. Negative for  chest pain and palpitations.  Gastrointestinal: Negative for abdominal pain, constipation, diarrhea, nausea and vomiting.  Endocrine: Negative.   Genitourinary: Negative for dysuria and flank pain.  Musculoskeletal: Negative for arthralgias, back pain, gait problem and neck pain.  Skin: Negative for color change.  Allergic/Immunologic: Negative for environmental allergies and food allergies.  Neurological: Negative for dizziness and headaches.  Hematological: Does not bruise/bleed easily.  Psychiatric/Behavioral: Positive for hallucinations. Negative for agitation and behavioral problems (depression).   Vital Signs: BP (!) 141/95   Pulse 81   Temp 98.7 F (37.1 C)   Ht 5\' 2"  (1.575 m)   Wt 255 lb (115.7 kg)   LMP 05/28/2015   BMI 46.64 kg/m  Observation/Objective: Pt is pleasant to talk, self isolated, NAD,concerned about her weight gain   Assessment/Plan: 1. Close Exposure to Covid-19 Virus - Order sent to testing center.  2. Abnormal weight gain - Recent steroid intake, will need further labs  3. Hypothyroidism, unspecified type - TSH + free T4  4. Essential hypertension - Monitor BP for now   General Counseling: Robecca verbalizes understanding of the findings of today's phone visit and agrees with plan of treatment. I have discussed any further diagnostic evaluation that may be needed or ordered today. We also reviewed her medications today. she has been encouraged to call the office with any questions or concerns that should arise related to todays visit.  Orders Placed This Encounter  Procedures  . TSH + free T4   Time spent:15 Minutes  Dr Lavera Guise Internal medicine

## 2018-11-16 NOTE — Telephone Encounter (Signed)
-----   Message from Corlis Hove sent at 11/16/2018 10:15 AM EDT ----- Regarding: covid 19 test Please call pt and schedule covid 19 test please call [phone]

## 2018-11-16 NOTE — Telephone Encounter (Signed)
Patient scheduled for covid testing today at 10:45 @ [place]

## 2018-11-18 LAB — NOVEL CORONAVIRUS, NAA: SARS-CoV-2, NAA: NOT DETECTED

## 2018-11-21 ENCOUNTER — Telehealth: Payer: Self-pay

## 2018-11-21 NOTE — Telephone Encounter (Signed)
Pt called stating that she have developed some symptoms of COVID, she recently took the test and results were negative, she is currently taking care of her mother whom tested positive and she is still in the home. The pt stated that she have been running a fever for two days now, 100.9 and 101.9 yesterday and today, she is having body aches, and some coughing she believes may be from her allergies, she is not experiencing any shortness of breath. Advised pt per DFK to take tylenol and drink plenty of fluids and we will check on her tomorrow if symptoms persist we will look into getting her retested.

## 2018-11-23 ENCOUNTER — Other Ambulatory Visit: Payer: Self-pay

## 2018-11-23 ENCOUNTER — Telehealth: Payer: Self-pay

## 2018-11-23 MED ORDER — AZITHROMYCIN 250 MG PO TABS: ORAL_TABLET | ORAL | 0 refills | Status: DC

## 2018-11-23 MED ORDER — PREDNISONE 20 MG PO TABS: ORAL_TABLET | ORAL | 0 refills | Status: DC

## 2018-11-23 NOTE — Telephone Encounter (Signed)
Pt called that she having bad coughing,body aches  ,congestion,fever on and off today 99.0 as per dr Humphrey Rolls advised pt we sending zpak 1 tab po daily for 10 days and prednisone 20 mg 1 tab po 3 days take tylenolol and nebulizer 3 times a days and used delsym for cough and drinks plenty of water and rest and called [place] back tomorrow how she is doing

## 2018-11-29 ENCOUNTER — Telehealth: Payer: Self-pay

## 2018-11-29 NOTE — Telephone Encounter (Signed)
Pt called that she don't have and no taste and no appetite and temp 99.9 pt like to wait for testing again for now I advised her that if her breathing is worse or she having chest pain she can go to ED otherwise finished advised her to finished antibiotic and used nebulizer and inhaler and also take Tylenol for fever and rest and wash hands often and drink lots of water

## 2018-11-30 ENCOUNTER — Other Ambulatory Visit: Payer: Self-pay

## 2018-11-30 ENCOUNTER — Telehealth: Payer: Self-pay | Admitting: *Deleted

## 2018-11-30 DIAGNOSIS — R6889 Other general symptoms and signs: Secondary | ICD-10-CM | POA: Diagnosis not present

## 2018-11-30 DIAGNOSIS — Z20822 Contact with and (suspected) exposure to covid-19: Secondary | ICD-10-CM

## 2018-11-30 NOTE — Telephone Encounter (Signed)
-----   Message from Corlis Hove sent at 11/30/2018  8:45 AM EDT ----- Please call pt and scheduled her for covid 19 due to her symptoms health department want her re tested

## 2018-11-30 NOTE — Telephone Encounter (Signed)
Pt scheduled for covid testing today @ 11:30 @ [facility]. Instructions given and order placed  

## 2018-12-02 LAB — NOVEL CORONAVIRUS, NAA: SARS-CoV-2, NAA: NOT DETECTED

## 2018-12-06 DIAGNOSIS — G4733 Obstructive sleep apnea (adult) (pediatric): Secondary | ICD-10-CM | POA: Diagnosis not present

## 2018-12-14 ENCOUNTER — Ambulatory Visit: Payer: BC Managed Care – PPO | Admitting: Nurse Practitioner

## 2018-12-20 ENCOUNTER — Other Ambulatory Visit: Payer: Self-pay

## 2018-12-20 ENCOUNTER — Ambulatory Visit (INDEPENDENT_AMBULATORY_CARE_PROVIDER_SITE_OTHER): Payer: BC Managed Care – PPO

## 2018-12-20 DIAGNOSIS — G4733 Obstructive sleep apnea (adult) (pediatric): Secondary | ICD-10-CM | POA: Diagnosis not present

## 2018-12-20 NOTE — Progress Notes (Signed)
95 percentile pressure 9   95th percentile leak 8.6   apnea index 1.0 /hr  apnea-hypopnea index  1.1 /hr   total days used  >4 hr 86 days  total days used <4 hr 4 days  Total compliance 86 percent  She is doing great no problems or questions

## 2018-12-28 ENCOUNTER — Emergency Department: Payer: BC Managed Care – PPO

## 2018-12-28 ENCOUNTER — Ambulatory Visit: Payer: Self-pay | Admitting: Nurse Practitioner

## 2018-12-28 ENCOUNTER — Encounter: Payer: Self-pay | Admitting: Emergency Medicine

## 2018-12-28 ENCOUNTER — Other Ambulatory Visit: Payer: Self-pay

## 2018-12-28 ENCOUNTER — Emergency Department
Admission: EM | Admit: 2018-12-28 | Discharge: 2018-12-28 | Disposition: A | Discharge status: Home / Self Care | Payer: BC Managed Care – PPO | Attending: Emergency Medicine | Admitting: Emergency Medicine

## 2018-12-28 DIAGNOSIS — J45909 Unspecified asthma, uncomplicated: Secondary | ICD-10-CM | POA: Diagnosis not present

## 2018-12-28 DIAGNOSIS — R0789 Other chest pain: Secondary | ICD-10-CM | POA: Diagnosis not present

## 2018-12-28 DIAGNOSIS — Z79899 Other long term (current) drug therapy: Secondary | ICD-10-CM | POA: Insufficient documentation

## 2018-12-28 DIAGNOSIS — R079 Chest pain, unspecified: Secondary | ICD-10-CM | POA: Diagnosis not present

## 2018-12-28 DIAGNOSIS — R200 Anesthesia of skin: Secondary | ICD-10-CM | POA: Insufficient documentation

## 2018-12-28 DIAGNOSIS — R160 Hepatomegaly, not elsewhere classified: Secondary | ICD-10-CM | POA: Diagnosis not present

## 2018-12-28 DIAGNOSIS — I10 Essential (primary) hypertension: Secondary | ICD-10-CM | POA: Insufficient documentation

## 2018-12-28 LAB — COMPREHENSIVE METABOLIC PANEL
ALT: 23 U/L (ref 0–44)
AST: 22 U/L (ref 15–41)
Albumin: 3.5 g/dL (ref 3.5–5.0)
Alkaline Phosphatase: 75 U/L (ref 38–126)
Anion gap: 9 (ref 5–15)
BUN: 9 mg/dL (ref 6–20)
CO2: 27 mmol/L (ref 22–32)
Calcium: 9.2 mg/dL (ref 8.9–10.3)
Chloride: 104 mmol/L (ref 98–111)
Creatinine, Ser: 0.63 mg/dL (ref 0.44–1.00)
GFR calc Af Amer: >60 mL/min (ref >60)
GFR calc non Af Amer: >60 mL/min (ref >60)
Glucose, Bld: 107 mg/dL — ABNORMAL HIGH (ref 70–99)
Potassium: 3.3 mmol/L — ABNORMAL LOW (ref 3.5–5.1)
Sodium: 140 mmol/L (ref 135–145)
Total Bilirubin: 0.9 mg/dL (ref 0.3–1.2)
Total Protein: 7.8 g/dL (ref 6.5–8.1)

## 2018-12-28 LAB — TROPONIN I (HIGH SENSITIVITY)
Troponin I (High Sensitivity): 5 ng/L (ref <18)
Troponin I (High Sensitivity): 5 ng/L (ref <18)

## 2018-12-28 LAB — CBC WITH DIFFERENTIAL/PLATELET
Abs Immature Granulocytes: 0 10*3/uL (ref 0.00–0.07)
Basophils Absolute: 0 10*3/uL (ref 0.0–0.1)
Basophils Relative: 1 %
Eosinophils Absolute: 0.2 10*3/uL (ref 0.0–0.5)
Eosinophils Relative: 3 %
HCT: 38.9 % (ref 36.0–46.0)
Hemoglobin: 13.6 g/dL (ref 12.0–15.0)
Immature Granulocytes: 0 %
Lymphocytes Relative: 50 %
Lymphs Abs: 2.7 10*3/uL (ref 0.7–4.0)
MCH: 31.2 pg (ref 26.0–34.0)
MCHC: 35 g/dL (ref 30.0–36.0)
MCV: 89.2 fL (ref 80.0–100.0)
Monocytes Absolute: 0.5 10*3/uL (ref 0.1–1.0)
Monocytes Relative: 10 %
Neutro Abs: 1.9 10*3/uL (ref 1.7–7.7)
Neutrophils Relative %: 36 %
Platelets: 212 10*3/uL (ref 150–400)
RBC: 4.36 MIL/uL (ref 3.87–5.11)
RDW: 11.9 % (ref 11.5–15.5)
WBC: 5.3 10*3/uL (ref 4.0–10.5)
nRBC: 0 % (ref 0.0–0.2)

## 2018-12-28 MED ORDER — FENTANYL CITRATE (PF) 100 MCG/2ML IJ SOLN
50.0000 ug | Freq: Once | INTRAMUSCULAR | Status: AC
  Administered 2018-12-28: 50 ug via INTRAVENOUS
  Filled 2018-12-28: qty 2

## 2018-12-28 MED ORDER — ONDANSETRON HCL 4 MG/2ML IJ SOLN
4.0000 mg | Freq: Once | INTRAMUSCULAR | Status: AC
  Administered 2018-12-28: 4 mg via INTRAVENOUS
  Filled 2018-12-28: qty 2

## 2018-12-28 MED ORDER — HYDROCODONE-ACETAMINOPHEN 5-325 MG PO TABS: 1.0000 | ORAL_TABLET | Freq: Four times a day (QID) | ORAL | 0 refills | Status: DC | PRN

## 2018-12-28 MED ORDER — KETOROLAC TROMETHAMINE 30 MG/ML IJ SOLN
15.0000 mg | Freq: Once | INTRAMUSCULAR | Status: AC
  Administered 2018-12-28: 15 mg via INTRAVENOUS
  Filled 2018-12-28: qty 1

## 2018-12-28 MED ORDER — SODIUM CHLORIDE 0.9 % IV BOLUS
1000.0000 mL | Freq: Once | INTRAVENOUS | Status: AC
  Administered 2018-12-28: 1000 mL via INTRAVENOUS

## 2018-12-28 MED ORDER — HYDROCODONE-ACETAMINOPHEN 5-325 MG PO TABS
2.0000 | ORAL_TABLET | Freq: Once | ORAL | Status: AC
  Administered 2018-12-28: 2 via ORAL
  Filled 2018-12-28: qty 2

## 2018-12-28 MED ORDER — NAPROXEN 375 MG PO TBEC: 375.0000 mg | DELAYED_RELEASE_TABLET | Freq: Every day | ORAL | 0 refills | Status: AC

## 2018-12-28 MED ORDER — IOPAMIDOL (ISOVUE-370) INJECTION 76%
100.0000 mL | Freq: Once | INTRAVENOUS | Status: AC | PRN
  Administered 2018-12-28: 100 mL via INTRAVENOUS

## 2018-12-28 NOTE — ED Triage Notes (Signed)
Patient ambulatory to triage with steady gait, without difficulty or distress noted; pt reports rt sided CP radiating into arm accomp by [facility] this morning

## 2018-12-28 NOTE — ED Provider Notes (Signed)
[facility] Emergency Department Provider Note   ____________________________________________   First MD Initiated Contact with Patient 12/28/18 [phone]     (approximate)  I have reviewed the triage vital signs and the nursing notes.   HISTORY  Chief Complaint Chest Pain    HPI [name] is a 48 y.o. female who presents to the ED from home with a chief complaint of right-sided chest pain with numbness radiating into her right arm into her fingertips.  Patient reports she had not been to sleep yet and had sudden onset of the above symptoms.  Husband notes she was short of breath and patient endorses nausea.  Denies fever, cough, abdominal pain, vomiting, neck pain, headache or dizziness.  Denies recent travel, trauma or exposure to persons diagnosed with coronavirus.       Past Medical History:  Diagnosis Date  . Allergy    environmental  . Anemia   . Asthma   . Bronchitis, acute   . Complication of anesthesia    HAD ASTHMA ATTACK WHILE UNDER  . Gastroesophageal reflux disease without esophagitis 11/02/2017  . Hypertension   . Sleep apnea     Patient Active Problem List   Diagnosis Date Noted  . Acute recurrent pansinusitis 09/13/2018  . Inflammatory pain of left heel 11/02/2017  . Gastroesophageal reflux disease without esophagitis 11/02/2017  . Asthma in adult 11/02/2017  . Essential hypertension 11/02/2017  . Postoperative state 06/09/2015  . 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 ABDOMINAL HYSTERECTOMY N/A 06/09/2015   Procedure: HYSTERECTOMY ABDOMINAL with Bilateral Salpingectomy;  Surgeon: Suzy Bouchardhomas J Schermerhorn, MD;  Location: [facility] ORS;  Service: Gynecology;  Laterality: N/A;  . CESAREAN SECTION  [id number],1993  . FINGER SURGERY  1992  . GASTRIC BYPASS    . INGUINAL HERNIA REPAIR Right 2010  . REPLACEMENT TOTAL KNEE Right     Prior to Admission medications   Medication Sig Start Date End  Date Taking? Authorizing Provider  albuterol (PROVENTIL HFA;VENTOLIN HFA) 108 (90 BASE) MCG/ACT inhaler Inhale 2 puffs into the lungs every 6 (six) hours as needed for wheezing or shortness of breath.    [provider]  azithromycin (ZITHROMAX) 250 MG tablet Take 1 tab po for 10 days  For sinus infections 11/23/18   Lyndon CodeKhan, Fozia M, MD  b complex vitamins tablet Take 1 tablet by mouth daily.    [provider]  budesonide-formoterol (SYMBICORT) 160-4.5 MCG/ACT inhaler Inhale 2 puffs into the lungs every 12 (twelve) hours.    [provider]  calcium-vitamin D (OSCAL WITH D) 500-200 MG-UNIT per tablet Take 1 tablet by mouth.    [provider]  Cholecalciferol (VITAMIN D-3 PO) Take 5,000 Int'l Units by mouth daily.    [provider]  ELDERBERRY PO Take by mouth.    [provider]  Ferrous Sulfate 27 MG TABS Take 1 tablet by mouth 2 (two) times daily.    [provider]  hydrochlorothiazide (HYDRODIURIL) 12.5 MG tablet TAKE 1 TABLET BY MOUTH ONCE DAILY 05/10/18   Johnna AcostaScarboro, Adam J, NP  ipratropium-albuterol (DUONEB) 0.5-2.5 (3) MG/3ML SOLN Take 3 mLs by nebulization every 4 (four) hours as needed. 08/15/18   Johnna AcostaScarboro, Adam J, NP  Boris LownKrill Oil (OMEGA-3) 500 MG CAPS Take by mouth.    [provider]  lubiprostone (AMITIZA) 24 MCG capsule Take 24 mcg by mouth 2 (two) times daily with a meal.    [provider]  metoprolol succinate (TOPROL-XL) 50 MG 24 hr tablet TAKE 1 TABLET BY MOUTH IN THE MORNING FOR BLOOD PRESSURE 07/19/18   Ronnell Freshwater, NP  montelukast (SINGULAIR) 10 MG tablet Take 10 mg by mouth daily.    [provider]  omeprazole (PRILOSEC) 40 MG capsule Take 40 mg by mouth daily.    [provider]  potassium chloride (K-DUR) 10 MEQ tablet Take 1 tablet (10 mEq total) by mouth daily. 11/06/18   Ronnell Freshwater, NP  predniSONE (DELTASONE) 20 MG tablet Take 1 tab po daily for 3 days 11/23/18   Lavera Guise, MD    Allergies Patient has no known allergies.  Family History  Problem Relation Age of Onset  . Diabetes Mother   . Hypertension Mother   . Cancer Mother   . Congestive Heart Failure Father   . Diabetes Father     Social History Social History   Tobacco Use  . Smoking status: Never Smoker  . Smokeless tobacco: Never Used  Substance Use Topics  . Alcohol use: No  . Drug use: No    Review of Systems  Constitutional: No fever/chills Eyes: No visual changes. ENT: No sore throat. Cardiovascular: Positive for right chest pain. Respiratory: Denies shortness of breath. Gastrointestinal: No abdominal pain.  No nausea, no vomiting.  No diarrhea.  No constipation. Genitourinary: Negative for dysuria. Musculoskeletal: Negative for back pain. Skin: Negative for rash. Neurological: Negative for headaches, focal weakness.  Positive for right upper extremity numbness.   ____________________________________________   PHYSICAL EXAM:  VITAL SIGNS: ED Triage Vitals  Enc Vitals Group     BP 12/28/18 0543 (!) 160/105     Pulse Rate 12/28/18 0543 75     Resp 12/28/18 0543 19     Temp 12/28/18 0543 98.2 F (36.8 C)     Temp Source 12/28/18 0543 Oral     SpO2 12/28/18 0543 100 %     Weight 12/28/18 0534 160 lb (72.6 kg)     Height 12/28/18 0534 5\' 2"  (1.575 m)     Head Circumference --      Peak Flow --      Pain Score 12/28/18 0534 10     Pain Loc --      Pain Edu? --      Excl. in [place]? --     Constitutional: Alert and oriented. Well appearing and in mild to moderate acute distress. Eyes: Conjunctivae are normal. PERRL. EOMI. Head: Atraumatic. Nose: No congestion/rhinnorhea. Mouth/Throat: Mucous membranes are moist.  Oropharynx non-erythematous. Neck: No stridor.  Supple neck without meningismus.  No carotid bruits.  No neck tenderness to palpation. Cardiovascular: Normal rate, regular rhythm. Grossly normal heart sounds.  Good peripheral circulation.  Respiratory: Normal respiratory effort.  No retractions. Lungs CTAB. Gastrointestinal: Soft and nontender to light and deep palpation. No distention. No abdominal bruits. No CVA tenderness. Musculoskeletal:  RUE: Fingers curled, denies pain to touch but states feels hypersensitive.  2+ radial pulse.  Symmetrically warm limbs without evidence for ischemia.  No tenderness to palpation wrist, forearm, elbow, humerus or shoulder. Neurologic:  Normal speech and language. No gross focal neurologic deficits are appreciated. No gait instability. Skin:  Skin is warm, dry and intact. No rash noted. Psychiatric: Mood and affect are normal. Speech and behavior are normal.  ____________________________________________   LABS (all labs ordered are listed, but only abnormal results are displayed)  Labs Reviewed  COMPREHENSIVE METABOLIC PANEL - Abnormal; Notable for the  following components:      Result Value   Potassium 3.3 (*)    Glucose, Bld 107 (*)    All other components within normal limits  CBC WITH DIFFERENTIAL/PLATELET  TROPONIN I (HIGH SENSITIVITY)   ____________________________________________  EKG  ED ECG REPORT I, SUNG,JADE J, the attending physician, personally viewed and interpreted this ECG.   Date: 12/28/2018  EKG Time: 0542  Rate: 81  Rhythm: normal EKG, normal sinus rhythm  Axis: Normal  Intervals:none  ST&T Change: Nonspecific  ____________________________________________  RADIOLOGY  ED MD interpretation: Pending  Official radiology report(s): No results found.  ____________________________________________   PROCEDURES  Procedure(s) performed (including Critical Care):  Procedures   ____________________________________________   INITIAL IMPRESSION / ASSESSMENT AND PLAN / ED COURSE  As part of my medical decision making, I reviewed the following data within the electronic MEDICAL RECORD NUMBER History obtained from family, Nursing notes reviewed and  incorporated, Labs reviewed, EKG interpreted, Old chart reviewed and Notes from prior ED visits     [name] was evaluated in Emergency Department on 12/28/2018 for the symptoms described in the history of present illness. She was evaluated in the context of the global COVID-19 pandemic, which necessitated consideration that the patient might be at risk for infection with the SARS-CoV-2 virus that causes COVID-19. Institutional protocols and algorithms that pertain to the evaluation of patients at risk for COVID-19 are in a state of rapid change based on information released by regulatory bodies including the CDC and federal and state organizations. These policies and algorithms were followed during the patient's care in the ED.   48 year old female who presents with right-sided chest pain with RUE numbness. Differential diagnosis includes, but is not limited to, ACS, aortic dissection, pulmonary embolism, cardiac tamponade, pneumothorax, pneumonia, pericarditis, myocarditis, GI-related causes including esophagitis/gastritis, and musculoskeletal chest wall pain.    Patient is hypertensive, but also physically appears to be in pain. Will check blood pressures in both arms, obtain cardiac labwork and proceed with CTA chest to evaluate for dissection.   Clinical Course as of Dec 27 656  Thu Dec 28, 2018  0650 I personally reviewed patient's old office visits; looks like her baseline BPs are 140s/100s.   [JS]  [id number] Patient looks and feels better after IV Fentanyl. Requests second dose while awaiting CT scan. Will also administer low dose lopressor for BP control. Care transferred to Dr. Erma HeritageIsaacs at change of shift pending lab/CT results and disposition.   [JS]    Clinical Course User Index [JS] Irean HongSung, Jade J, MD     ____________________________________________   FINAL CLINICAL IMPRESSION(S) / ED DIAGNOSES  Final diagnoses:  Chest pain, unspecified type  Essential hypertension     ED  Discharge Orders    None       Note:  This document was prepared using Dragon voice recognition software and may include unintentional dictation errors.   Irean HongSung, Jade J, MD 12/28/18 [phone]

## 2018-12-28 NOTE — ED Provider Notes (Signed)
Assumed care from Dr. Beather Arbour at 7 AM. Briefly, the patient is a 48 y.o. female with PMHx of  has a past medical history of Allergy, Anemia, Asthma, Bronchitis, acute, Complication of anesthesia, Gastroesophageal reflux disease without esophagitis (11/02/2017), Hypertension, and Sleep apnea. here with right arm pain, chest pain, and hypertension. Pt arrived markedly hypertensive, is awaiting CT Angio for evaluation   Labs Reviewed  COMPREHENSIVE METABOLIC PANEL - Abnormal; Notable for the following components:      Result Value   Potassium 3.3 (*)    Glucose, Bld 107 (*)    All other components within normal limits  CBC WITH DIFFERENTIAL/PLATELET  TROPONIN I (HIGH SENSITIVITY)  TROPONIN I (HIGH SENSITIVITY)    Course of Care: -CT Angio neg for dissection or other abnormality. Trop neg x 2 with low-risk HEART score. Of note, pt's sx started as primarily radicular pain in her right arm, and she now tells me she has a h/o intermittent radicular sx in her neck. Unclear whether this was primary radiculopathy triggering pain with HTN, or atypical chest pain. Nonetheless, pt's sx ar emarkeldy improved here and she is requesting d/c, which I think is reasonable. BP now completely normal after analgesia which is more c/w HTN 2/2 pain. Will tx with brief course of analgesics and anti-inflammatories, refer for outpt follow-up.     Duffy Bruce, MD 12/28/18 2009

## 2018-12-30 ENCOUNTER — Other Ambulatory Visit: Payer: Self-pay | Admitting: Nurse Practitioner

## 2018-12-30 DIAGNOSIS — M5010 Cervical disc disorder with radiculopathy, unspecified cervical region: Secondary | ICD-10-CM

## 2018-12-30 MED ORDER — PREDNISONE 20 MG PO TABS: ORAL_TABLET | ORAL | 0 refills | Status: DC

## 2018-12-30 NOTE — Progress Notes (Signed)
Patient complaining of tingling in both hands. Prescribed naproxen in ER on Thursday. Patient allergic to this medication. Sent in prednisone taper - take as prescribed for six days. Sent to [facility] [address].

## 2019-01-01 ENCOUNTER — Other Ambulatory Visit: Payer: Self-pay

## 2019-01-01 ENCOUNTER — Encounter: Payer: Self-pay | Admitting: Adult Health

## 2019-01-01 ENCOUNTER — Ambulatory Visit: Payer: BC Managed Care – PPO | Admitting: Adult Health

## 2019-01-01 VITALS — BP 130/93 | HR 94 | Temp 98.0°F | Resp 16 | Ht 62.0 in | Wt 266.0 lb

## 2019-01-01 DIAGNOSIS — K769 Liver disease, unspecified: Secondary | ICD-10-CM | POA: Diagnosis not present

## 2019-01-01 DIAGNOSIS — L0232 Furuncle of buttock: Secondary | ICD-10-CM | POA: Diagnosis not present

## 2019-01-01 DIAGNOSIS — M5410 Radiculopathy, site unspecified: Secondary | ICD-10-CM

## 2019-01-01 MED ORDER — DOXYCYCLINE HYCLATE 100 MG PO TABS: 100.0000 mg | ORAL_TABLET | Freq: Two times a day (BID) | ORAL | 0 refills | Status: DC

## 2019-01-01 NOTE — Progress Notes (Signed)
[facility] [address] [place], [place] [address]  Internal MEDICINE  Office Visit Note  Patient Name: [name]  [id number], 2072  [id number]  Date of Service: 01/01/2019  Chief Complaint  Patient presents with  . Hospitalization Follow-up    er follow up , pt was in hospital with pain in hands , chest pain and nausea   . Recurrent Skin Infections    on the buttocks area right side, very painful new since hospital     HPI  Pt is here for follow up. She went to the ED for chest pain and right arm pain.  She also had bilateral finger/hand numbness.  She is also complaining today of an area on her right buttock where she believes she has a skin infection.  She describes it as a boil.     Current Medication: Outpatient Encounter Medications as of 01/01/2019  Medication Sig  . albuterol (PROVENTIL HFA;VENTOLIN HFA) 108 (90 BASE) MCG/ACT inhaler Inhale 2 puffs into the lungs every 6 (six) hours as needed for wheezing or shortness of breath.  Marland Kitchen. b complex vitamins tablet Take 1 tablet by mouth daily.  . budesonide-formoterol (SYMBICORT) 160-4.5 MCG/ACT inhaler Inhale 2 puffs into the lungs every 12 (twelve) hours.  . calcium-vitamin D (OSCAL WITH D) 500-200 MG-UNIT per tablet Take 1 tablet by mouth.  . Cholecalciferol (VITAMIN D-3 PO) Take 5,000 Int'l Units by mouth daily.  Marland Kitchen. ELDERBERRY PO Take by mouth.  . Ferrous Sulfate 27 MG TABS Take 1 tablet by mouth 2 (two) times daily.  . hydrochlorothiazide (HYDRODIURIL) 12.5 MG tablet TAKE 1 TABLET BY MOUTH ONCE DAILY  . HYDROcodone-acetaminophen (NORCO/VICODIN) 5-325 MG tablet Take 1-2 tablets by mouth every 6 (six) hours as needed for moderate pain or severe pain.  Marland Kitchen. ipratropium-albuterol (DUONEB) 0.5-2.5 (3) MG/3ML SOLN Take 3 mLs by nebulization every 4 (four) hours as needed.  Boris Lown. Krill Oil (OMEGA-3) 500 MG CAPS Take by mouth.  . lubiprostone (AMITIZA) 24 MCG capsule Take 24 mcg by mouth 2 (two) times daily with a meal.  .  metoprolol succinate (TOPROL-XL) 50 MG 24 hr tablet TAKE 1 TABLET BY MOUTH IN THE MORNING FOR BLOOD PRESSURE  . montelukast (SINGULAIR) 10 MG tablet Take 10 mg by mouth daily.  Marland Kitchen. omeprazole (PRILOSEC) 40 MG capsule Take 40 mg by mouth daily.  . potassium chloride (K-DUR) 10 MEQ tablet Take 1 tablet (10 mEq total) by mouth daily.  . Naproxen 375 MG TBEC Take 1 tablet (375 mg total) by mouth daily for 7 days. (Patient not taking: Reported on 01/01/2019)  . [DISCONTINUED] azithromycin (ZITHROMAX) 250 MG tablet Take 1 tab po for 10 days  For sinus infections (Patient not taking: Reported on 01/01/2019)  . [DISCONTINUED] predniSONE (DELTASONE) 20 MG tablet Take 1 tab po daily for 3 days (Patient not taking: Reported on 01/01/2019)   No facility-administered encounter medications on file as of 01/01/2019.     Surgical History: Past Surgical History:  Procedure Laterality Date  . ABDOMINAL HYSTERECTOMY N/A 06/09/2015   Procedure: HYSTERECTOMY ABDOMINAL with Bilateral Salpingectomy;  Surgeon: Suzy Bouchardhomas J Schermerhorn, MD;  Location: [facility] ORS;  Service: Gynecology;  Laterality: N/A;  . CESAREAN SECTION  [id number],1993  . FINGER SURGERY  1992  . GASTRIC BYPASS    . INGUINAL HERNIA REPAIR Right 2010  . REPLACEMENT TOTAL KNEE Right     Medical History: Past Medical History:  Diagnosis Date  . Allergy    environmental  . Anemia   .  Asthma   . Bronchitis, acute   . Complication of anesthesia    HAD ASTHMA ATTACK WHILE UNDER  . Gastroesophageal reflux disease without esophagitis 11/02/2017  . Hypertension   . Sleep apnea     Family History: Family History  Problem Relation Age of Onset  . Diabetes Mother   . Hypertension Mother   . Cancer Mother   . Congestive Heart Failure Father   . Diabetes Father     Social History   Socioeconomic History  . Marital status: Married    Spouse name: Not on file  . Number of children: Not on file  . Years of education: Not on file  . Highest education level: Not  on file  Occupational History  . Not on file  Social Needs  . Financial resource strain: Not on file  . Food insecurity    Worry: Not on file    Inability: Not on file  . Transportation needs    Medical: Not on file    Non-medical: Not on file  Tobacco Use  . Smoking status: Never Smoker  . Smokeless tobacco: Never Used  Substance and Sexual Activity  . Alcohol use: No  . Drug use: No  . Sexual activity: Yes  Lifestyle  . Physical activity    Days per week: Not on file    Minutes per session: Not on file  . Stress: Not on file  Relationships  . Social Musicianconnections    Talks on phone: Not on file    Gets together: Not on file    Attends religious service: Not on file    Active member of club or organization: Not on file    Attends meetings of clubs or organizations: Not on file    Relationship status: Not on file  . Intimate partner violence    Fear of current or ex partner: Not on file    Emotionally abused: Not on file    Physically abused: Not on file    Forced sexual activity: Not on file  Other Topics Concern  . Not on file  Social History Narrative  . Not on file      Review of Systems  Constitutional: Negative for chills, fatigue and unexpected weight change.  HENT: Negative for congestion, rhinorrhea, sneezing and sore throat.   Eyes: Negative for photophobia, pain and redness.  Respiratory: Negative for cough, chest tightness and shortness of breath.   Cardiovascular: Negative for chest pain and palpitations.  Gastrointestinal: Negative for abdominal pain, constipation, diarrhea, nausea and vomiting.  Endocrine: Negative.   Genitourinary: Negative for dysuria and frequency.  Musculoskeletal: Negative for arthralgias, back pain, joint swelling and neck pain.  Skin: Negative for rash.  Allergic/Immunologic: Negative.   Neurological: Negative for tremors and numbness.  Hematological: Negative for adenopathy. Does not bruise/bleed easily.   Psychiatric/Behavioral: Negative for behavioral problems and sleep disturbance. The patient is not nervous/anxious.     Vital Signs: BP (!) 130/93   Pulse 94   Temp 98 F (36.7 C)   Resp 16   Ht 5\' 2"  (1.575 m)   Wt 266 lb (120.7 kg)   LMP 05/28/2015   SpO2 99%   BMI 48.65 kg/m    Physical Exam Vitals signs and nursing note reviewed.  Constitutional:      General: She is not in acute distress.    Appearance: She is well-developed. She is not diaphoretic.  HENT:     Head: Normocephalic and atraumatic.  Mouth/Throat:     Pharynx: No oropharyngeal exudate.  Eyes:     Pupils: Pupils are equal, round, and reactive to light.  Neck:     Musculoskeletal: Normal range of motion and neck supple.     Thyroid: No thyromegaly.     Vascular: No JVD.     Trachea: No tracheal deviation.  Cardiovascular:     Rate and Rhythm: Normal rate and regular rhythm.     Heart sounds: Normal heart sounds. No murmur. No friction rub. No gallop.   Pulmonary:     Effort: Pulmonary effort is normal. No respiratory distress.     Breath sounds: Normal breath sounds. No wheezing or rales.  Chest:     Chest wall: No tenderness.  Abdominal:     Palpations: Abdomen is soft.     Tenderness: There is no abdominal tenderness. There is no guarding.  Musculoskeletal: Normal range of motion.  Lymphadenopathy:     Cervical: No cervical adenopathy.  Skin:    General: Skin is warm and dry.  Neurological:     Mental Status: She is alert and oriented to person, place, and time.     Cranial Nerves: No cranial nerve deficit.  Psychiatric:        Behavior: Behavior normal.        Thought Content: Thought content normal.        Judgment: Judgment normal.     Assessment/Plan: 1. Boil of buttock Advised patient to take entire course of antibiotics as prescribed with food. Pt should return to clinic in 7-10 days if symptoms fail to improve or new symptoms develop.  Reviewed risks and possible side effects  associated with taking opiates, benzodiazepines and other CNS depressants. Combination of these could cause dizziness and drowsiness. Advised patient not to drive or operate machinery when taking these medications, as patient's and other's life can be at risk and will have consequences. Patient verbalized understanding in this matter. Dependence and abuse for these drugs will be monitored closely. A Controlled substance policy and procedure is on file which allows [facility] to order a urine drug screen test at any visit. Patient understands and agrees with the plan - doxycycline (VIBRA-TABS) 100 MG tablet; Take 1 tablet (100 mg total) by mouth 2 (two) times daily.  Dispense: 28 tablet; Refill: 0 - traMADol (ULTRAM) 50 MG tablet; Take 1 tablet (50 mg total) by mouth every 8 (eight) hours as needed for up to 5 days.  Dispense: 10 tablet; Refill: 0  2. Radiculopathy affecting upper extremity Will get cervical spine MRI to evaluate for Ms. place vertebrae.  Patient take prednisone as directed. - MR Cervical Spine Wo Contrast; Future - predniSONE (DELTASONE) 10 MG tablet; Use per dose pack  Dispense: 21 tablet; Refill: 0  3. Liver disease We will also get MRI of abdomen to evaluate ongoing nausea and pain in the right upper quadrant. - MR Abdomen W Wo Contrast; Future  General Counseling: Shadow verbalizes understanding of the findings of todays visit and agrees with plan of treatment. I have discussed any further diagnostic evaluation that may be needed or ordered today. We also reviewed her medications today. she has been encouraged to call the office with any questions or concerns that should arise related to todays visit.    No orders of the defined types were placed in this encounter.   No orders of the defined types were placed in this encounter.   Time spent: 15 Minutes   This  patient was seen by Blima LedgerAdam Maxim Bedel AGNP-C in Collaboration with Dr Lyndon CodeFozia M Khan as a part of  collaborative care agreement     Johnna AcostaAdam J. Patience Nuzzo AGNP-C Internal medicine

## 2019-01-02 ENCOUNTER — Ambulatory Visit: Payer: Self-pay | Admitting: Adult Health

## 2019-01-03 MED ORDER — POTASSIUM CHLORIDE 40 MEQ/15ML (20%) PO SOLN: 10.0000 meq | Freq: Every day | ORAL | 0 refills | Status: DC

## 2019-01-03 MED ORDER — TRAMADOL HCL 50 MG PO TABS: 50.0000 mg | ORAL_TABLET | Freq: Three times a day (TID) | ORAL | 0 refills | Status: AC | PRN

## 2019-01-03 MED ORDER — PREDNISONE 10 MG PO TABS: ORAL_TABLET | ORAL | 0 refills | Status: DC

## 2019-01-05 DIAGNOSIS — G4733 Obstructive sleep apnea (adult) (pediatric): Secondary | ICD-10-CM | POA: Diagnosis not present

## 2019-01-09 ENCOUNTER — Ambulatory Visit: Payer: Self-pay | Admitting: Adult Health

## 2019-01-13 ENCOUNTER — Ambulatory Visit
Admission: RE | Admit: 2019-01-13 | Discharge: 2019-01-13 | Disposition: A | Discharge status: Home / Self Care | Payer: BC Managed Care – PPO | Source: Ambulatory Visit | Attending: Adult Health | Admitting: Adult Health

## 2019-01-13 ENCOUNTER — Other Ambulatory Visit: Payer: Self-pay

## 2019-01-13 DIAGNOSIS — K769 Liver disease, unspecified: Secondary | ICD-10-CM | POA: Diagnosis not present

## 2019-01-13 DIAGNOSIS — K760 Fatty (change of) liver, not elsewhere classified: Secondary | ICD-10-CM | POA: Diagnosis not present

## 2019-01-13 MED ORDER — GADOBUTROL 1 MMOL/ML IV SOLN
10.0000 mL | Freq: Once | INTRAVENOUS | Status: AC | PRN
  Administered 2019-01-13: 10 mL via INTRAVENOUS

## 2019-01-18 ENCOUNTER — Telehealth: Payer: Self-pay

## 2019-01-18 NOTE — Telephone Encounter (Signed)
Pt advised we discuss mri result on aug 24

## 2019-01-19 DIAGNOSIS — M542 Cervicalgia: Secondary | ICD-10-CM | POA: Diagnosis not present

## 2019-01-19 DIAGNOSIS — M5412 Radiculopathy, cervical region: Secondary | ICD-10-CM | POA: Diagnosis not present

## 2019-01-22 ENCOUNTER — Other Ambulatory Visit: Payer: Self-pay

## 2019-01-22 ENCOUNTER — Other Ambulatory Visit: Payer: Self-pay | Admitting: Adult Health

## 2019-01-22 ENCOUNTER — Encounter: Payer: Self-pay | Admitting: Internal Medicine

## 2019-01-22 ENCOUNTER — Ambulatory Visit: Payer: BC Managed Care – PPO | Admitting: Internal Medicine

## 2019-01-22 VITALS — BP 116/86 | HR 90 | Resp 16 | Ht 62.0 in | Wt 258.0 lb

## 2019-01-22 DIAGNOSIS — R0602 Shortness of breath: Secondary | ICD-10-CM

## 2019-01-22 DIAGNOSIS — J4521 Mild intermittent asthma with (acute) exacerbation: Secondary | ICD-10-CM

## 2019-01-22 DIAGNOSIS — G4733 Obstructive sleep apnea (adult) (pediatric): Secondary | ICD-10-CM

## 2019-01-22 DIAGNOSIS — Z9989 Dependence on other enabling machines and devices: Secondary | ICD-10-CM | POA: Diagnosis not present

## 2019-01-22 MED ORDER — LUBIPROSTONE 24 MCG PO CAPS: 24.0000 ug | ORAL_CAPSULE | Freq: Two times a day (BID) | ORAL | 1 refills | Status: DC

## 2019-01-22 NOTE — Progress Notes (Signed)
[facility] [place], [place] [address]  Internal MEDICINE  Office Visit Note  Patient Name: [name]  [id number]  [id number]  Date of Service: 01/22/2019  Chief Complaint  Patient presents with  . Sleep Apnea    cpap compliance ,    HPI Pt is here for follow up on osa. Pts last compliance was 86%.  She reports wearing it every night.  She reports she is cleaning her machine and changing filters and tubing as directed.  She denies any issues at this time.  She reports her asthma has been doing well, she has needed her nebulizer a few times, but overall has been doing well.  She continues to use inhalers.     Current Medication: Outpatient Encounter Medications as of 01/22/2019  Medication Sig  . albuterol (PROVENTIL HFA;VENTOLIN HFA) 108 (90 BASE) MCG/ACT inhaler Inhale 2 puffs into the lungs every 6 (six) hours as needed for wheezing or shortness of breath.  Marland Kitchen b complex vitamins tablet Take 1 tablet by mouth daily.  . budesonide-formoterol (SYMBICORT) 160-4.5 MCG/ACT inhaler Inhale 2 puffs into the lungs every 12 (twelve) hours.  . calcium-vitamin D (OSCAL WITH D) 500-200 MG-UNIT per tablet Take 1 tablet by mouth.  . Cholecalciferol (VITAMIN D-3 PO) Take 5,000 Int'l Units by mouth daily.  Marland Kitchen doxycycline (VIBRA-TABS) 100 MG tablet Take 1 tablet (100 mg total) by mouth 2 (two) times daily.  Marland Kitchen ELDERBERRY PO Take by mouth.  . Ferrous Sulfate 27 MG TABS Take 1 tablet by mouth 2 (two) times daily.  . hydrochlorothiazide (HYDRODIURIL) 12.5 MG tablet TAKE 1 TABLET BY MOUTH ONCE DAILY  . HYDROcodone-acetaminophen (NORCO/VICODIN) 5-325 MG tablet Take 1-2 tablets by mouth every 6 (six) hours as needed for moderate pain or severe pain.  Marland Kitchen ipratropium-albuterol (DUONEB) 0.5-2.5 (3) MG/3ML SOLN Take 3 mLs by nebulization every 4 (four) hours as needed.  Javier Docker Oil (OMEGA-3) 500 MG CAPS Take by mouth.  . lubiprostone (AMITIZA) 24 MCG capsule Take 24 mcg by mouth 2  (two) times daily with a meal.  . metoprolol succinate (TOPROL-XL) 50 MG 24 hr tablet TAKE 1 TABLET BY MOUTH IN THE MORNING FOR BLOOD PRESSURE  . montelukast (SINGULAIR) 10 MG tablet Take 10 mg by mouth daily.  Marland Kitchen omeprazole (PRILOSEC) 40 MG capsule Take 40 mg by mouth daily.  . Potassium Chloride 40 MEQ/15ML (20%) SOLN Take 10 mEq by mouth daily.  . predniSONE (DELTASONE) 10 MG tablet Use per dose pack   No facility-administered encounter medications on file as of 01/22/2019.     Surgical History: Past Surgical History:  Procedure Laterality Date  . ABDOMINAL HYSTERECTOMY N/A 06/09/2015   Procedure: HYSTERECTOMY ABDOMINAL with Bilateral Salpingectomy;  Surgeon: Boykin Nearing, MD;  Location: [facility] ORS;  Service: Gynecology;  Laterality: N/A;  . CESAREAN SECTION  [id number]  . [place]  . GASTRIC BYPASS    . INGUINAL HERNIA REPAIR Right 2010  . REPLACEMENT TOTAL KNEE Right     Medical History: Past Medical History:  Diagnosis Date  . Allergy    environmental  . Anemia   . Asthma   . Bronchitis, acute   . Complication of anesthesia    HAD ASTHMA ATTACK WHILE UNDER  . Gastroesophageal reflux disease without esophagitis 11/02/2017  . Hypertension   . Sleep apnea     Family History: Family History  Problem Relation Age of Onset  . Diabetes Mother   . Hypertension Mother   .  Cancer Mother   . Congestive Heart Failure Father   . Diabetes Father     Social History   Socioeconomic History  . Marital status: Married    Spouse name: Not on file  . Number of children: Not on file  . Years of education: Not on file  . Highest education level: Not on file  Occupational History  . Not on file  Social Needs  . Financial resource strain: Not on file  . Food insecurity    Worry: Not on file    Inability: Not on file  . Transportation needs    Medical: Not on file    Non-medical: Not on file  Tobacco Use  . Smoking status: Never Smoker  . Smokeless tobacco:  Never Used  Substance and Sexual Activity  . Alcohol use: No  . Drug use: No  . Sexual activity: Yes  Lifestyle  . Physical activity    Days per week: Not on file    Minutes per session: Not on file  . Stress: Not on file  Relationships  . Social Musicianconnections    Talks on phone: Not on file    Gets together: Not on file    Attends religious service: Not on file    Active member of club or organization: Not on file    Attends meetings of clubs or organizations: Not on file    Relationship status: Not on file  . Intimate partner violence    Fear of current or ex partner: Not on file    Emotionally abused: Not on file    Physically abused: Not on file    Forced sexual activity: Not on file  Other Topics Concern  . Not on file  Social History Narrative  . Not on file      Review of Systems  Constitutional: Negative for chills, fatigue and unexpected weight change.  HENT: Negative for congestion, rhinorrhea, sneezing and sore throat.   Eyes: Negative for photophobia, pain and redness.  Respiratory: Negative for cough, chest tightness and shortness of breath.   Cardiovascular: Negative for chest pain and palpitations.  Gastrointestinal: Negative for abdominal pain, constipation, diarrhea, nausea and vomiting.  Endocrine: Negative.   Genitourinary: Negative for dysuria and frequency.  Musculoskeletal: Negative for arthralgias, back pain, joint swelling and neck pain.  Skin: Negative for rash.  Allergic/Immunologic: Negative.   Neurological: Negative for tremors and numbness.  Hematological: Negative for adenopathy. Does not bruise/bleed easily.  Psychiatric/Behavioral: Negative for behavioral problems and sleep disturbance. The patient is not nervous/anxious.     Vital Signs: BP 116/86   Pulse 90   Resp 16   Ht 5\' 2"  (1.575 m)   Wt 258 lb (117 kg)   LMP 05/28/2015   SpO2 98%   BMI 47.19 kg/m    Physical Exam Vitals signs and nursing note reviewed.  Constitutional:       General: She is not in acute distress.    Appearance: She is well-developed. She is not diaphoretic.  HENT:     Head: Normocephalic and atraumatic.     Mouth/Throat:     Pharynx: No oropharyngeal exudate.  Eyes:     Pupils: Pupils are equal, round, and reactive to light.  Neck:     Musculoskeletal: Normal range of motion and neck supple.     Thyroid: No thyromegaly.     Vascular: No JVD.     Trachea: No tracheal deviation.  Cardiovascular:     Rate and Rhythm: Normal  rate and regular rhythm.     Heart sounds: Normal heart sounds. No murmur. No friction rub. No gallop.   Pulmonary:     Effort: Pulmonary effort is normal. No respiratory distress.     Breath sounds: Normal breath sounds. No wheezing or rales.  Chest:     Chest wall: No tenderness.  Abdominal:     Palpations: Abdomen is soft.     Tenderness: There is no abdominal tenderness. There is no guarding.  Musculoskeletal: Normal range of motion.  Lymphadenopathy:     Cervical: No cervical adenopathy.  Skin:    General: Skin is warm and dry.  Neurological:     Mental Status: She is alert and oriented to person, place, and time.     Cranial Nerves: No cranial nerve deficit.  Psychiatric:        Behavior: Behavior normal.        Thought Content: Thought content normal.        Judgment: Judgment normal.     Assessment/Plan: 1. OSA on CPAP Continue to use cpap as discussed.    2. Mild intermittent asthma with acute exacerbation in adult Stable, continue present management.   3. SOB (shortness of breath) - Spirometry with Graph  General Counseling: Jaquia verbalizes understanding of the findings of todays visit and agrees with plan of treatment. I have discussed any further diagnostic evaluation that may be needed or ordered today. We also reviewed her medications today. she has been encouraged to call the office with any questions or concerns that should arise related to todays visit.    Orders Placed This  Encounter  Procedures  . Spirometry with Graph    No orders of the defined types were placed in this encounter.   Time spent: 15 Minutes   This patient was seen by Blima LedgerAdam Aubry Tucholski AGNP-C in Collaboration with Dr Lyndon CodeFozia M Khan as a part of collaborative care agreement     Johnna AcostaAdam J. Tyron Manetta AGNP-C Pulmonary medicine

## 2019-01-22 NOTE — Progress Notes (Signed)
Refilled Amit[facility] RX for patient.

## 2019-01-25 ENCOUNTER — Other Ambulatory Visit: Payer: Self-pay | Admitting: Student

## 2019-01-25 DIAGNOSIS — M5410 Radiculopathy, site unspecified: Secondary | ICD-10-CM

## 2019-02-03 ENCOUNTER — Ambulatory Visit
Admission: RE | Admit: 2019-02-03 | Discharge: 2019-02-03 | Disposition: A | Discharge status: Home / Self Care | Payer: BC Managed Care – PPO | Source: Ambulatory Visit | Attending: Adult Health | Admitting: Adult Health

## 2019-02-03 ENCOUNTER — Other Ambulatory Visit: Payer: Self-pay

## 2019-02-03 DIAGNOSIS — M5410 Radiculopathy, site unspecified: Secondary | ICD-10-CM | POA: Diagnosis not present

## 2019-02-03 DIAGNOSIS — M4802 Spinal stenosis, cervical region: Secondary | ICD-10-CM | POA: Diagnosis not present

## 2019-02-06 ENCOUNTER — Encounter: Payer: Self-pay | Admitting: Nurse Practitioner

## 2019-02-06 ENCOUNTER — Ambulatory Visit: Payer: BC Managed Care – PPO | Admitting: Nurse Practitioner

## 2019-02-06 ENCOUNTER — Other Ambulatory Visit: Payer: Self-pay

## 2019-02-06 VITALS — BP 126/89 | HR 79 | Resp 16 | Ht 62.0 in | Wt 261.0 lb

## 2019-02-06 DIAGNOSIS — E876 Hypokalemia: Secondary | ICD-10-CM | POA: Diagnosis not present

## 2019-02-06 DIAGNOSIS — Z1239 Encounter for other screening for malignant neoplasm of breast: Secondary | ICD-10-CM

## 2019-02-06 DIAGNOSIS — G4733 Obstructive sleep apnea (adult) (pediatric): Secondary | ICD-10-CM | POA: Diagnosis not present

## 2019-02-06 DIAGNOSIS — J4521 Mild intermittent asthma with (acute) exacerbation: Secondary | ICD-10-CM

## 2019-02-06 DIAGNOSIS — M5010 Cervical disc disorder with radiculopathy, unspecified cervical region: Secondary | ICD-10-CM

## 2019-02-06 DIAGNOSIS — I10 Essential (primary) hypertension: Secondary | ICD-10-CM | POA: Diagnosis not present

## 2019-02-06 DIAGNOSIS — R300 Dysuria: Secondary | ICD-10-CM | POA: Diagnosis not present

## 2019-02-06 DIAGNOSIS — Z0001 Encounter for general adult medical examination with abnormal findings: Secondary | ICD-10-CM

## 2019-02-06 MED ORDER — POTASSIUM CHLORIDE CRYS ER 20 MEQ PO TBCR: 20.0000 meq | EXTENDED_RELEASE_TABLET | Freq: Every day | ORAL | 3 refills | Status: DC

## 2019-02-06 NOTE — Progress Notes (Signed)
[facility] [address] [place], [place] [id number]  Internal MEDICINE  Office Visit Note  Patient Name: [name]  [id number] 12, 2072  [id number]  Date of Service: 02/07/2019   Pt is here for routine health maintenance examination  Chief Complaint  Patient presents with  . Annual Exam  . Gynecologic Exam  . Hypertension  . Gastroesophageal Reflux  . Anemia     The patient is here for health maintenance exam. She does not need to have pap smear. She hsa had total hysterectomy with bilateral salpingectomy in 2017. She continues to have neck pain with numbness and tingling and burning down the arms and into the hands. She recently had MRI, ordered per neurosurgery. She does have diffuse disc bulging at C4/C5 and C5/C6 with disc hypertrophy and foraminal narrowing. She also was diagnosed with pretty significant carpal tunnel, bilaterally. She does not have follow up scheduled at this time. They started her on gabapentin 300mg  at night as well as muscle relaxer which she takes at night.      Current Medication: Outpatient Encounter Medications as of 02/06/2019  Medication Sig  . albuterol (PROVENTIL HFA;VENTOLIN HFA) 108 (90 BASE) MCG/ACT inhaler Inhale 2 puffs into the lungs every 6 (six) hours as needed for wheezing or shortness of breath.  Marland Kitchen. b complex vitamins tablet Take 1 tablet by mouth daily.  . budesonide-formoterol (SYMBICORT) 160-4.5 MCG/ACT inhaler Inhale 2 puffs into the lungs every 12 (twelve) hours.  . calcium-vitamin D (OSCAL WITH D) 500-200 MG-UNIT per tablet Take 1 tablet by mouth.  . Cholecalciferol (VITAMIN D-3 PO) Take 5,000 Int'l Units by mouth daily.  Marland Kitchen. ELDERBERRY PO Take by mouth.  . Ferrous Sulfate 27 MG TABS Take 1 tablet by mouth 2 (two) times daily.  Marland Kitchen. gabapentin (NEURONTIN) 300 MG capsule Take 300 mg by mouth daily.  . hydrochlorothiazide (HYDRODIURIL) 12.5 MG tablet TAKE 1 TABLET BY MOUTH ONCE DAILY  . ipratropium-albuterol (DUONEB) 0.5-2.5 (3)  MG/3ML SOLN Take 3 mLs by nebulization every 4 (four) hours as needed.  Boris Lown. Krill Oil (OMEGA-3) 500 MG CAPS Take by mouth.  . lubiprostone (AMITIZA) 24 MCG capsule Take 1 capsule (24 mcg total) by mouth 2 (two) times daily with a meal.  . metoprolol succinate (TOPROL-XL) 50 MG 24 hr tablet TAKE 1 TABLET BY MOUTH IN THE MORNING FOR BLOOD PRESSURE  . montelukast (SINGULAIR) 10 MG tablet Take 10 mg by mouth daily.  Marland Kitchen. omeprazole (PRILOSEC) 40 MG capsule Take 40 mg by mouth daily.  . potassium chloride SA (K-DUR) 20 MEQ tablet Take 1 tablet (20 mEq total) by mouth daily.  . [DISCONTINUED] doxycycline (VIBRA-TABS) 100 MG tablet Take 1 tablet (100 mg total) by mouth 2 (two) times daily. (Patient not taking: Reported on 02/06/2019)  . [DISCONTINUED] HYDROcodone-acetaminophen (NORCO/VICODIN) 5-325 MG tablet Take 1-2 tablets by mouth every 6 (six) hours as needed for moderate pain or severe pain. (Patient not taking: Reported on 02/06/2019)  . [DISCONTINUED] Potassium Chloride 40 MEQ/15ML (20%) SOLN Take 10 mEq by mouth daily. (Patient not taking: Reported on 02/06/2019)  . [DISCONTINUED] predniSONE (DELTASONE) 10 MG tablet Use per dose pack (Patient not taking: Reported on 02/06/2019)   No facility-administered encounter medications on file as of 02/06/2019.     Surgical History: Past Surgical History:  Procedure Laterality Date  . ABDOMINAL HYSTERECTOMY N/A 06/09/2015   Procedure: HYSTERECTOMY ABDOMINAL with Bilateral Salpingectomy;  Surgeon: Suzy Bouchardhomas J Schermerhorn, MD;  Location: [facility] ORS;  Service: Gynecology;  Laterality: N/A;  .  CESAREAN SECTION  [id number]  . FINGER SURGERY  1992  . GASTRIC BYPASS    . INGUINAL HERNIA REPAIR Right 2010  . REPLACEMENT TOTAL KNEE Right     Medical History: Past Medical History:  Diagnosis Date  . Allergy    environmental  . Anemia   . Asthma   . Bronchitis, acute   . Complication of anesthesia    HAD ASTHMA ATTACK WHILE UNDER  . Gastroesophageal reflux disease  without esophagitis 11/02/2017  . Hypertension   . Sleep apnea     Family History: Family History  Problem Relation Age of Onset  . Diabetes Mother   . Hypertension Mother   . Cancer Mother   . Congestive Heart Failure Father   . Diabetes Father       Review of Systems  Constitutional: Negative for activity change, chills, fatigue and unexpected weight change.  HENT: Negative for congestion, rhinorrhea, sneezing and sore throat.   Respiratory: Negative for cough, chest tightness, shortness of breath and wheezing.   Cardiovascular: Negative for chest pain and palpitations.  Gastrointestinal: Negative for abdominal pain, constipation, diarrhea, nausea and vomiting.  Endocrine: Negative for cold intolerance, heat intolerance, polydipsia and polyuria.  Genitourinary: Negative for dysuria and frequency.  Musculoskeletal: Positive for arthralgias, myalgias, neck pain and neck stiffness. Negative for back pain and joint swelling.  Skin: Negative for rash.  Allergic/Immunologic: Negative for environmental allergies.  Neurological: Negative for dizziness, tremors, numbness and headaches.  Hematological: Negative for adenopathy. Does not bruise/bleed easily.  Psychiatric/Behavioral: Negative for behavioral problems and sleep disturbance. The patient is not nervous/anxious.      Today's Vitals   02/06/19 1459  BP: 126/89  Pulse: 79  Resp: 16  SpO2: 94%  Weight: 261 lb (118.4 kg)  Height: 5\' 2"  (1.575 m)   Body mass index is 47.74 kg/m.  Physical Exam Vitals signs and nursing note reviewed.  Constitutional:      General: She is not in acute distress.    Appearance: Normal appearance. She is well-developed. She is obese. She is not diaphoretic.  HENT:     Head: Normocephalic and atraumatic.     Mouth/Throat:     Pharynx: No oropharyngeal exudate.  Eyes:     Pupils: Pupils are equal, round, and reactive to light.  Neck:     Musculoskeletal: Normal range of motion and neck  supple.     Thyroid: No thyromegaly.     Vascular: No carotid bruit or JVD.     Trachea: No tracheal deviation.  Cardiovascular:     Rate and Rhythm: Normal rate and regular rhythm.     Pulses: Normal pulses.     Heart sounds: Normal heart sounds. No murmur. No friction rub. No gallop.   Pulmonary:     Effort: Pulmonary effort is normal. No respiratory distress.     Breath sounds: Normal breath sounds. No wheezing or rales.  Chest:     Chest wall: No tenderness.     Breasts:        Right: Normal. No swelling, bleeding, inverted nipple, mass, nipple discharge, skin change or tenderness.        Left: Normal. No swelling, bleeding, inverted nipple, mass, nipple discharge, skin change or tenderness.  Abdominal:     General: Bowel sounds are normal.     Palpations: Abdomen is soft.     Tenderness: There is no abdominal tenderness.     Hernia: A hernia is present.  Musculoskeletal: Normal range of motion.  Lymphadenopathy:     Cervical: No cervical adenopathy.  Skin:    General: Skin is warm and dry.  Neurological:     Mental Status: She is alert and oriented to person, place, and time. Mental status is at baseline.     Cranial Nerves: No cranial nerve deficit.  Psychiatric:        Behavior: Behavior normal.        Thought Content: Thought content normal.        Judgment: Judgment normal.      LABS: Recent Results (from the past 2160 hour(s))  Novel Coronavirus, NAA ([facility])     Status: None   Collection Time: 11/16/18 10:51 AM  Result Value Ref Range   SARS-CoV-2, NAA Not Detected Not Detected    Comment: Testing was performed using the cobas(R) SARS-CoV-2 test. This test was developed and its performance characteristics determined by [facility]. This test has not been FDA cleared or approved. This test has been authorized by FDA under an Emergency Use Authorization (EUA). This test is only authorized for the duration of time the declaration that circumstances  exist justifying the authorization of the emergency use of in vitro diagnostic tests for detection of SARS-CoV-2 virus and/or diagnosis of COVID-19 infection under section 564(b)(1) of the Act, 21 U.S.C. 161WRU-0(A)(5360bbb-3(b)(1), unless the authorization is terminated or revoked sooner. When diagnostic testing is negative, the possibility of a false negative result should be considered in the context of a patient's recent exposures and the presence of clinical signs and symptoms consistent with COVID-19. An individual without symptoms of COVID-19 and who is not shedding SARS-CoV-2 virus would expect to have a negati ve (not detected) result in this assay.   Novel Coronavirus, NAA ([facility])     Status: None   Collection Time: 11/30/18 10:33 AM  Result Value Ref Range   SARS-CoV-2, NAA Not Detected Not Detected    Comment: This test was developed and its performance characteristics determined by [facility]. This test has not been FDA cleared or approved. This test has been authorized by FDA under an Emergency Use Authorization (EUA). This test is only authorized for the duration of time the declaration that circumstances exist justifying the authorization of the emergency use of in vitro diagnostic tests for detection of SARS-CoV-2 virus and/or diagnosis of COVID-19 infection under section 564(b)(1) of the Act, 21 U.S.C. 409WJX-9(J)(4360bbb-3(b)(1), unless the authorization is terminated or revoked sooner. When diagnostic testing is negative, the possibility of a false negative result should be considered in the context of a patient's recent exposures and the presence of clinical signs and symptoms consistent with COVID-19. An individual without symptoms of COVID-19 and who is not shedding SARS-CoV-2 virus would expect to have a negative (not detected) result in this assay.   CBC with Differential     Status: None   Collection Time: 12/28/18  6:23 AM  Result Value Ref Range   WBC 5.3 4.0 - 10.5  K/uL   RBC 4.36 3.87 - 5.11 MIL/uL   Hemoglobin 13.6 12.0 - 15.0 g/dL   HCT 78.238.9 95.636.0 - 21.346.0 %   MCV 89.2 80.0 - 100.0 fL   MCH 31.2 26.0 - 34.0 pg   MCHC 35.0 30.0 - 36.0 g/dL   RDW 08.611.9 57.811.5 - 46.915.5 %   Platelets 212 150 - 400 K/uL   nRBC 0.0 0.0 - 0.2 %   Neutrophils Relative % 36 %   Neutro Abs 1.9 1.7 - 7.7 K/uL   Lymphocytes Relative  50 %   Lymphs Abs 2.7 0.7 - 4.0 K/uL   Monocytes Relative 10 %   Monocytes Absolute 0.5 0.1 - 1.0 K/uL   Eosinophils Relative 3 %   Eosinophils Absolute 0.2 0.0 - 0.5 K/uL   Basophils Relative 1 %   Basophils Absolute 0.0 0.0 - 0.1 K/uL   Immature Granulocytes 0 %   Abs Immature Granulocytes 0.00 0.00 - 0.07 K/uL    Comment: Performed at [facility], [address] [place], [place] [address]  Comprehensive metabolic panel     Status: Abnormal   Collection Time: 12/28/18  6:23 AM  Result Value Ref Range   Sodium 140 135 - 145 mmol/L   Potassium 3.3 (L) 3.5 - 5.1 mmol/L   Chloride 104 98 - 111 mmol/L   CO2 27 22 - 32 mmol/L   Glucose, Bld 107 (H) 70 - 99 mg/dL   BUN 9 6 - 20 mg/dL   Creatinine, Ser 3.66 0.44 - 1.00 mg/dL   Calcium 9.2 8.9 - 29.4 mg/dL   Total Protein 7.8 6.5 - 8.1 g/dL   Albumin 3.5 3.5 - 5.0 g/dL   AST 22 15 - 41 U/L   ALT 23 0 - 44 U/L   Alkaline Phosphatase 75 38 - 126 U/L   Total Bilirubin 0.9 0.3 - 1.2 mg/dL   GFR calc non Af Amer >60 >60 mL/min   GFR calc Af Amer >60 >60 mL/min   Anion gap 9 5 - 15    Comment: Performed at [facility], [address], [place], [place] [address]  Troponin I (High Sensitivity)     Status: None   Collection Time: 12/28/18  6:23 AM  Result Value Ref Range   Troponin I (High Sensitivity) 5 <18 ng/L    Comment: (NOTE) Elevated high sensitivity troponin I (hsTnI) values and significant  changes across serial measurements may suggest ACS but many other  chronic and acute conditions are known to elevate hsTnI results.  Refer to the "Links" section for chest pain  algorithms and additional  guidance. Performed at [facility], [address] [place], [place] [address]   Troponin I (High Sensitivity)     Status: None   Collection Time: 12/28/18 10:02 AM  Result Value Ref Range   Troponin I (High Sensitivity) 5 <18 ng/L    Comment: (NOTE) Elevated high sensitivity troponin I (hsTnI) values and significant  changes across serial measurements may suggest ACS but many other  chronic and acute conditions are known to elevate hsTnI results.  Refer to the "Links" section for chest pain algorithms and additional  guidance. Performed at [facility], [address] [place], [place] [address]   UA/M w/rflx Culture, Routine     Status: Abnormal (Preliminary result)   Collection Time: 02/06/19  3:00 PM   Specimen: Urine   URINE  Result Value Ref Range   Specific Gravity, UA 1.025 1.005 - 1.030   pH, UA 5.0 5.0 - 7.5   Color, UA Yellow Yellow   Appearance Ur Clear Clear   Leukocytes,UA Negative Negative   Protein,UA Negative Negative/Trace   Glucose, UA Negative Negative   Ketones, UA Trace (A) Negative   RBC, UA Negative Negative   Bilirubin, UA Negative Negative   Urobilinogen, Ur 0.2 0.2 - 1.0 mg/dL   Nitrite, UA Negative Negative   Microscopic Examination Comment     Comment: Microscopic follows if indicated.   Microscopic Examination See below:  Comment: Microscopic was indicated and was performed.   Urinalysis Reflex WILL FOLLOW   Microscopic Examination     Status: None (Preliminary result)   Collection Time: 02/06/19  3:00 PM   URINE  Result Value Ref Range   WBC, UA WILL FOLLOW    RBC WILL FOLLOW    Epithelial Cells (non renal) WILL FOLLOW    Renal Epithel, UA WILL FOLLOW    Casts WILL FOLLOW    Cast Type WILL FOLLOW    Crystals WILL FOLLOW    Kelcy Type WILL FOLLOW    Mucus, UA WILL FOLLOW    Bacteria, UA WILL FOLLOW    Yeast, UA WILL FOLLOW    Trichomonas, UA WILL FOLLOW    Urinalysis Comments  WILL FOLLOW     Assessment/Plan: 1. Encounter for general adult medical examination with abnormal findings Annual health maintenance exam today.  2. Essential hypertension Stable. Continue bp medication as prescribed   3. Cervical disc disorder with radiculopathy Reviewed results of MRI done 02/04/2019. Advised she follow up with orthopedis/neurosurgery for continued evaluation and treatment.   4. Hypokalemia Change potassium supplement to tablet which can be crushed to allow for better absorption.  - potassium chloride SA (K-DUR) 20 MEQ tablet; Take 1 tablet (20 mEq total) by mouth daily.  Dispense: 30 tablet; Refill: 3  5. Mild intermittent asthma with acute exacerbation in adult Continue to use inhalers as prescribed   6. Screening for breast cancer - MM DIGITAL SCREENING BILATERAL; Future  7. Dysuria - UA/M w/rflx Culture, Routine  General Counseling: Cherolyn verbalizes understanding of the findings of todays visit and agrees with plan of treatment. I have discussed any further diagnostic evaluation that may be needed or ordered today. We also reviewed her medications today. she has been encouraged to call the office with any questions or concerns that should arise related to todays visit.    Counseling:  This patient was seen by Leretha Pol FNP Collaboration with Dr Lavera Guise as a part of collaborative care agreement  Orders Placed This Encounter  Procedures  . Microscopic Examination  . MM DIGITAL SCREENING BILATERAL  . UA/M w/rflx Culture, Routine    Meds ordered this encounter  Medications  . potassium chloride SA (K-DUR) 20 MEQ tablet    Sig: Take 1 tablet (20 mEq total) by mouth daily.    Dispense:  30 tablet    Refill:  3    Please fill with tablet which can be crushed for the patient    Order Specific Question:   Supervising Provider    Answer:   Lavera Guise [[id number]]    Time spent: Ali Chukson, MD  Internal Medicine

## 2019-02-07 DIAGNOSIS — R300 Dysuria: Secondary | ICD-10-CM | POA: Insufficient documentation

## 2019-02-07 DIAGNOSIS — Z1239 Encounter for other screening for malignant neoplasm of breast: Secondary | ICD-10-CM | POA: Insufficient documentation

## 2019-02-07 DIAGNOSIS — M5010 Cervical disc disorder with radiculopathy, unspecified cervical region: Secondary | ICD-10-CM | POA: Insufficient documentation

## 2019-02-07 DIAGNOSIS — E876 Hypokalemia: Secondary | ICD-10-CM | POA: Insufficient documentation

## 2019-02-07 LAB — UA/M W/RFLX CULTURE, ROUTINE
Bilirubin, UA: NEGATIVE
Glucose, UA: NEGATIVE
Leukocytes,UA: NEGATIVE
Nitrite, UA: NEGATIVE
Protein,UA: NEGATIVE
RBC, UA: NEGATIVE
Specific Gravity, UA: 1.025 (ref 1.005–1.030)
Urobilinogen, Ur: 0.2 mg/dL (ref 0.2–1.0)
pH, UA: 5 (ref 5.0–7.5)

## 2019-02-07 LAB — MICROSCOPIC EXAMINATION: Casts: NONE SEEN /lpf

## 2019-02-08 ENCOUNTER — Other Ambulatory Visit: Payer: Self-pay | Admitting: Student

## 2019-02-08 DIAGNOSIS — M5412 Radiculopathy, cervical region: Secondary | ICD-10-CM

## 2019-02-14 ENCOUNTER — Ambulatory Visit
Admission: RE | Admit: 2019-02-14 | Discharge: 2019-02-14 | Disposition: A | Discharge status: Home / Self Care | Payer: BC Managed Care – PPO | Source: Ambulatory Visit | Attending: Student | Admitting: Student

## 2019-02-14 ENCOUNTER — Other Ambulatory Visit: Payer: Self-pay

## 2019-02-14 DIAGNOSIS — M542 Cervicalgia: Secondary | ICD-10-CM | POA: Diagnosis not present

## 2019-02-14 DIAGNOSIS — M5412 Radiculopathy, cervical region: Secondary | ICD-10-CM

## 2019-02-14 MED ORDER — IOPAMIDOL (ISOVUE-M 200) INJECTION 41%: 1.0000 mL | Freq: Once | INTRAMUSCULAR | Status: DC

## 2019-02-14 MED ORDER — METHYLPREDNISOLONE ACETATE 40 MG/ML INJ SUSP (RADIOLOG: 120.0000 mg | Freq: Once | INTRAMUSCULAR | Status: DC

## 2019-02-14 NOTE — Discharge Instructions (Signed)

## 2019-03-08 ENCOUNTER — Telehealth: Payer: Self-pay

## 2019-03-08 DIAGNOSIS — G4733 Obstructive sleep apnea (adult) (pediatric): Secondary | ICD-10-CM | POA: Diagnosis not present

## 2019-03-09 NOTE — Telephone Encounter (Signed)
She needs to have labs done before I will change her dose of potassium. I understand that she is having leg cramps, but she has not had labs in almost a year and last check of potassium was normal.

## 2019-03-09 NOTE — Telephone Encounter (Signed)
Spoke with pt please repeat labs and then heather going and see need to increase potassium and pt had  labslip so advised her do labs

## 2019-03-24 ENCOUNTER — Other Ambulatory Visit: Payer: Self-pay | Admitting: Adult Health

## 2019-03-24 ENCOUNTER — Other Ambulatory Visit
Admission: RE | Admit: 2019-03-24 | Discharge: 2019-03-24 | Disposition: A | Discharge status: Home / Self Care | Payer: BC Managed Care – PPO | Source: Ambulatory Visit | Attending: Nurse Practitioner | Admitting: Nurse Practitioner

## 2019-03-24 DIAGNOSIS — E876 Hypokalemia: Secondary | ICD-10-CM | POA: Diagnosis not present

## 2019-03-24 DIAGNOSIS — E559 Vitamin D deficiency, unspecified: Secondary | ICD-10-CM | POA: Diagnosis not present

## 2019-03-24 DIAGNOSIS — I10 Essential (primary) hypertension: Secondary | ICD-10-CM | POA: Diagnosis not present

## 2019-03-24 LAB — CBC WITH DIFFERENTIAL/PLATELET
Abs Immature Granulocytes: 0.01 10*3/uL (ref 0.00–0.07)
Basophils Absolute: 0 10*3/uL (ref 0.0–0.1)
Basophils Relative: 0 %
Eosinophils Absolute: 0.1 10*3/uL (ref 0.0–0.5)
Eosinophils Relative: 2 %
HCT: 39.9 % (ref 36.0–46.0)
Hemoglobin: 13.6 g/dL (ref 12.0–15.0)
Immature Granulocytes: 0 %
Lymphocytes Relative: 40 %
Lymphs Abs: 1.8 10*3/uL (ref 0.7–4.0)
MCH: 30.8 pg (ref 26.0–34.0)
MCHC: 34.1 g/dL (ref 30.0–36.0)
MCV: 90.5 fL (ref 80.0–100.0)
Monocytes Absolute: 0.4 10*3/uL (ref 0.1–1.0)
Monocytes Relative: 9 %
Neutro Abs: 2.2 10*3/uL (ref 1.7–7.7)
Neutrophils Relative %: 49 %
Platelets: 254 10*3/uL (ref 150–400)
RBC: 4.41 MIL/uL (ref 3.87–5.11)
RDW: 12.5 % (ref 11.5–15.5)
WBC: 4.5 10*3/uL (ref 4.0–10.5)
nRBC: 0 % (ref 0.0–0.2)

## 2019-03-24 LAB — COMPREHENSIVE METABOLIC PANEL
ALT: 40 U/L (ref 0–44)
AST: 29 U/L (ref 15–41)
Albumin: 3.4 g/dL — ABNORMAL LOW (ref 3.5–5.0)
Alkaline Phosphatase: 74 U/L (ref 38–126)
Anion gap: 11 (ref 5–15)
BUN: 13 mg/dL (ref 6–20)
CO2: 27 mmol/L (ref 22–32)
Calcium: 9.3 mg/dL (ref 8.9–10.3)
Chloride: 103 mmol/L (ref 98–111)
Creatinine, Ser: 0.74 mg/dL (ref 0.44–1.00)
GFR calc Af Amer: >60 mL/min (ref >60)
GFR calc non Af Amer: >60 mL/min (ref >60)
Glucose, Bld: 103 mg/dL — ABNORMAL HIGH (ref 70–99)
Potassium: 3.9 mmol/L (ref 3.5–5.1)
Sodium: 141 mmol/L (ref 135–145)
Total Bilirubin: 0.9 mg/dL (ref 0.3–1.2)
Total Protein: 8.1 g/dL (ref 6.5–8.1)

## 2019-03-24 LAB — LIPID PANEL
Cholesterol: 203 mg/dL — ABNORMAL HIGH (ref 0–200)
HDL: 53 mg/dL (ref >40)
LDL Cholesterol: 124 mg/dL — ABNORMAL HIGH (ref 0–99)
Total CHOL/HDL Ratio: 3.8 RATIO
Triglycerides: 130 mg/dL (ref <150)
VLDL: 26 mg/dL (ref 0–40)

## 2019-03-24 LAB — VITAMIN D 25 HYDROXY (VIT D DEFICIENCY, FRACTURES): Vit D, 25-Hydroxy: 47.99 ng/mL (ref 30–100)

## 2019-03-24 LAB — TSH: TSH: 1.032 u[IU]/mL (ref 0.350–4.500)

## 2019-03-24 LAB — T4, FREE: Free T4: 0.89 ng/dL (ref 0.61–1.12)

## 2019-03-26 ENCOUNTER — Other Ambulatory Visit: Payer: Self-pay | Admitting: Nurse Practitioner

## 2019-03-26 MED ORDER — METOPROLOL SUCCINATE ER 50 MG PO TB24: ORAL_TABLET | ORAL | 6 refills | Status: DC

## 2019-04-01 NOTE — Progress Notes (Signed)
Review at visit 05/10/2019

## 2019-04-02 ENCOUNTER — Other Ambulatory Visit: Payer: Self-pay | Admitting: Adult Health

## 2019-04-02 DIAGNOSIS — J4521 Mild intermittent asthma with (acute) exacerbation: Secondary | ICD-10-CM

## 2019-04-03 ENCOUNTER — Ambulatory Visit: Payer: BC Managed Care – PPO | Admitting: Nurse Practitioner

## 2019-04-03 ENCOUNTER — Encounter: Payer: Self-pay | Admitting: Nurse Practitioner

## 2019-04-03 ENCOUNTER — Other Ambulatory Visit: Payer: Self-pay

## 2019-04-03 VITALS — BP 116/90 | HR 90 | Temp 99.3°F | Ht 62.0 in | Wt 257.0 lb

## 2019-04-03 DIAGNOSIS — J4521 Mild intermittent asthma with (acute) exacerbation: Secondary | ICD-10-CM | POA: Diagnosis not present

## 2019-04-03 DIAGNOSIS — J069 Acute upper respiratory infection, unspecified: Secondary | ICD-10-CM | POA: Diagnosis not present

## 2019-04-03 MED ORDER — AZITHROMYCIN 250 MG PO TABS: ORAL_TABLET | ORAL | 0 refills | Status: DC

## 2019-04-03 MED ORDER — PREDNISONE 10 MG (21) PO TBPK: ORAL_TABLET | ORAL | 0 refills | Status: DC

## 2019-04-03 NOTE — Progress Notes (Signed)
[facility] [address] [place], [place] [address]  Internal MEDICINE  Telephone Visit  Patient Name: [name]  [id number] 11, 2072  [id number]  Date of Service: 04/03/2019  I connected with the patient at 9:16am by webcam and verified the patients identity using two identifiers.   I discussed the limitations, risks, security and privacy concerns of performing an evaluation and management service by webcam and the availability of in person appointments. I also discussed with the patient that there may be a patient responsible charge related to the service.  The patient expressed understanding and agrees to proceed.    Chief Complaint  Patient presents with  . Telephone Assessment  . Telephone Screen  . Cough    center of chest is hurting when cough, chest is burning a little bit   . Fever    low grade   . Nasal Congestion  . Breathing Problem    feels like an elephant is sitting on her chest, wheezing, better than yesterday, using nebulizar treatment     The patient has been contacted via webcam for follow up visit due to concerns for spread of novel coronavirus. The patient states that her asthma is acting up. She has cough, coughing up some yellow mucus. She does have low-grade. She states that her chest is hurting when she coughs. She had to take two breathing treatments in a ros as wheezing would not let up. States that she got no relief from the first one. She has nasal congested. States that symptoms started last week and have gradually got worse. She actually missed work yesterday as she was feeling so poorly. She will be missing work again today,       Current Medication: Outpatient Encounter Medications as of 04/03/2019  Medication Sig  . acetaminophen (TYLENOL) 500 MG tablet Take 500 mg by mouth every 6 (six) hours as needed.  Marland Kitchen. albuterol (PROVENTIL HFA;VENTOLIN HFA) 108 (90 BASE) MCG/ACT inhaler Inhale 2 puffs into the lungs every 6 (six) hours as needed for  wheezing or shortness of breath.  Marland Kitchen. b complex vitamins tablet Take 1 tablet by mouth daily.  . budesonide-formoterol (SYMBICORT) 160-4.5 MCG/ACT inhaler Inhale 2 puffs into the lungs every 12 (twelve) hours.  . calcium-vitamin D (OSCAL WITH D) 500-200 MG-UNIT per tablet Take 1 tablet by mouth.  . Cholecalciferol (VITAMIN D-3 PO) Take 5,000 Int'l Units by mouth daily.  Marland Kitchen. ELDERBERRY PO Take by mouth.  . Ferrous Sulfate 27 MG TABS Take 1 tablet by mouth 2 (two) times daily.  Marland Kitchen. gabapentin (NEURONTIN) 300 MG capsule Take 300 mg by mouth daily.  . hydrochlorothiazide (HYDRODIURIL) 12.5 MG tablet Take 1 tablet by mouth once daily  . ipratropium-albuterol (DUONEB) 0.5-2.5 (3) MG/3ML SOLN USE 1 AMPULE IN NEBULIZER EVERY 4 HOURS AS NEEDED  . Krill Oil (OMEGA-3) 500 MG CAPS Take by mouth.  . lubiprostone (AMITIZA) 24 MCG capsule Take 1 capsule (24 mcg total) by mouth 2 (two) times daily with a meal.  . metoprolol succinate (TOPROL-XL) 50 MG 24 hr tablet TAKE 1 TABLET BY MOUTH IN THE MORNING FOR BLOOD PRESSURE  . montelukast (SINGULAIR) 10 MG tablet Take 10 mg by mouth daily.  Marland Kitchen. omeprazole (PRILOSEC) 40 MG capsule Take 40 mg by mouth daily.  . potassium chloride SA (K-DUR) 20 MEQ tablet Take 1 tablet (20 mEq total) by mouth daily.  Marland Kitchen. tiZANidine (ZANAFLEX) 2 MG tablet Take 2 mg by mouth 3 (three) times daily.  Marland Kitchen. azithromycin (ZITHROMAX) 250 MG  tablet z-pack - take as directed for 5 days for acute upper respiratory infection.  . predniSONE (STERAPRED UNI-PAK 21 TAB) 10 MG (21) TBPK tablet 6 day taper - take by mouth as directed for 6 days   No facility-administered encounter medications on file as of 04/03/2019.     Surgical History: Past Surgical History:  Procedure Laterality Date  . ABDOMINAL HYSTERECTOMY N/A 06/09/2015   Procedure: HYSTERECTOMY ABDOMINAL with Bilateral Salpingectomy;  Surgeon: Suzy Bouchard, MD;  Location: [facility] ORS;  Service: Gynecology;  Laterality: N/A;  . CESAREAN SECTION   [id number]  . FINGER SURGERY  1992  . GASTRIC BYPASS    . INGUINAL HERNIA REPAIR Right 2010  . REPLACEMENT TOTAL KNEE Right     Medical History: Past Medical History:  Diagnosis Date  . Allergy    environmental  . Anemia   . Asthma   . Bronchitis, acute   . Complication of anesthesia    HAD ASTHMA ATTACK WHILE UNDER  . Gastroesophageal reflux disease without esophagitis 11/02/2017  . Hypertension   . Sleep apnea     Family History: Family History  Problem Relation Age of Onset  . Diabetes Mother   . Hypertension Mother   . Cancer Mother   . Congestive Heart Failure Father   . Diabetes Father     Social History   Socioeconomic History  . Marital status: Married    Spouse name: Not on file  . Number of children: Not on file  . Years of education: Not on file  . Highest education level: Not on file  Occupational History  . Not on file  Social Needs  . Financial resource strain: Not on file  . Food insecurity    Worry: Not on file    Inability: Not on file  . Transportation needs    Medical: Not on file    Non-medical: Not on file  Tobacco Use  . Smoking status: Never Smoker  . Smokeless tobacco: Never Used  Substance and Sexual Activity  . Alcohol use: No  . Drug use: No  . Sexual activity: Yes  Lifestyle  . Physical activity    Days per week: Not on file    Minutes per session: Not on file  . Stress: Not on file  Relationships  . Social Musician on phone: Not on file    Gets together: Not on file    Attends religious service: Not on file    Active member of club or organization: Not on file    Attends meetings of clubs or organizations: Not on file    Relationship status: Not on file  . Intimate partner violence    Fear of current or ex partner: Not on file    Emotionally abused: Not on file    Physically abused: Not on file    Forced sexual activity: Not on file  Other Topics Concern  . Not on file  Social History Narrative  . Not  on file      Review of Systems  Constitutional: Positive for activity change, fatigue and fever.  HENT: Positive for congestion, postnasal drip and rhinorrhea. Negative for sore throat.   Respiratory: Positive for cough and wheezing.   Cardiovascular: Negative for chest pain and palpitations.  Gastrointestinal: Negative for nausea and vomiting.  Musculoskeletal: Positive for arthralgias and myalgias.  Allergic/Immunologic: Positive for environmental allergies.  Neurological: Positive for headaches.  Hematological: Positive for adenopathy.    Today's Vitals  04/03/19 0905  BP: 116/90  Pulse: 90  Temp: 99.3 F (37.4 C)  Weight: 257 lb (116.6 kg)  Height: 5\' 2"  (1.575 m)   Body mass index is 47.01 kg/m.  Observation/Objective:   The patient is alert and oriented. She is pleasant and answers all questions appropriately. Breathing is non-labored. She is in no acute distress at this time. The patient is nasally congested. She has congested cough which is non-productive at this time.   Assessment/Plan: 1. Acute upper respiratory infection Start z-pack take as directed for 5 days. Rest and increase fluids. Continue to take OTC medication as needed and as indicated to improve acute symptoms. A work note will be given to keep her out of work 11/2, 11/3, and 11/4 - azithromycin (ZITHROMAX) 250 MG tablet; z-pack - take as directed for 5 days for acute upper respiratory infection.  Dispense: 6 tablet; Refill: 0  2. Mild intermittent asthma with acute exacerbation in adult Add prednisone taper. Take as directed for 6 days. Use inhaler and nebulizer treatments as needed and as prescribed.  - predniSONE (STERAPRED UNI-PAK 21 TAB) 10 MG (21) TBPK tablet; 6 day taper - take by mouth as directed for 6 days  Dispense: 21 tablet; Refill: 0  General Counseling: [name] verbalizes understanding of the findings of today's phone visit and agrees with plan of treatment. I have discussed any further  diagnostic evaluation that may be needed or ordered today. We also reviewed her medications today. she has been encouraged to call the office with any questions or concerns that should arise related to todays visit.   This patient was seen by [place] with Dr Lavera Guise as a part of collaborative care agreement  Meds ordered this encounter  Medications  . azithromycin (ZITHROMAX) 250 MG tablet    Sig: z-pack - take as directed for 5 days for acute upper respiratory infection.    Dispense:  6 tablet    Refill:  0    Order Specific Question:   Supervising Provider    Answer:   Lavera Guise [[id number]]  . predniSONE (STERAPRED UNI-PAK 21 TAB) 10 MG (21) TBPK tablet    Sig: 6 day taper - take by mouth as directed for 6 days    Dispense:  21 tablet    Refill:  0    Order Specific Question:   Supervising Provider    Answer:   Lavera Guise [[id number]]    Time spent: 73 Minutes    Dr Lavera Guise Internal medicine

## 2019-04-04 ENCOUNTER — Emergency Department: Payer: BC Managed Care – PPO

## 2019-04-04 ENCOUNTER — Other Ambulatory Visit: Payer: Self-pay

## 2019-04-04 ENCOUNTER — Emergency Department
Admission: EM | Admit: 2019-04-04 | Discharge: 2019-04-05 | Disposition: A | Discharge status: Home / Self Care | Payer: BC Managed Care – PPO | Attending: Emergency Medicine | Admitting: Emergency Medicine

## 2019-04-04 DIAGNOSIS — Z79899 Other long term (current) drug therapy: Secondary | ICD-10-CM | POA: Insufficient documentation

## 2019-04-04 DIAGNOSIS — R000 Tachycardia, unspecified: Secondary | ICD-10-CM | POA: Diagnosis not present

## 2019-04-04 DIAGNOSIS — I10 Essential (primary) hypertension: Secondary | ICD-10-CM | POA: Diagnosis not present

## 2019-04-04 DIAGNOSIS — R0602 Shortness of breath: Secondary | ICD-10-CM | POA: Insufficient documentation

## 2019-04-04 DIAGNOSIS — R569 Unspecified convulsions: Secondary | ICD-10-CM | POA: Diagnosis not present

## 2019-04-04 DIAGNOSIS — Z20828 Contact with and (suspected) exposure to other viral communicable diseases: Secondary | ICD-10-CM | POA: Insufficient documentation

## 2019-04-04 DIAGNOSIS — Z96651 Presence of right artificial knee joint: Secondary | ICD-10-CM | POA: Insufficient documentation

## 2019-04-04 DIAGNOSIS — J45909 Unspecified asthma, uncomplicated: Secondary | ICD-10-CM | POA: Diagnosis not present

## 2019-04-04 DIAGNOSIS — R05 Cough: Secondary | ICD-10-CM | POA: Diagnosis not present

## 2019-04-04 DIAGNOSIS — G40909 Epilepsy, unspecified, not intractable, without status epilepticus: Secondary | ICD-10-CM | POA: Diagnosis not present

## 2019-04-04 DIAGNOSIS — R410 Disorientation, unspecified: Secondary | ICD-10-CM | POA: Diagnosis not present

## 2019-04-04 DIAGNOSIS — R404 Transient alteration of awareness: Secondary | ICD-10-CM | POA: Diagnosis not present

## 2019-04-04 LAB — COMPREHENSIVE METABOLIC PANEL
ALT: 30 U/L (ref 0–44)
AST: 26 U/L (ref 15–41)
Albumin: 3.4 g/dL — ABNORMAL LOW (ref 3.5–5.0)
Alkaline Phosphatase: 79 U/L (ref 38–126)
Anion gap: 11 (ref 5–15)
BUN: 11 mg/dL (ref 6–20)
CO2: 24 mmol/L (ref 22–32)
Calcium: 9.3 mg/dL (ref 8.9–10.3)
Chloride: 105 mmol/L (ref 98–111)
Creatinine, Ser: 0.91 mg/dL (ref 0.44–1.00)
GFR calc Af Amer: >60 mL/min (ref >60)
GFR calc non Af Amer: >60 mL/min (ref >60)
Glucose, Bld: 210 mg/dL — ABNORMAL HIGH (ref 70–99)
Potassium: 4.2 mmol/L (ref 3.5–5.1)
Sodium: 140 mmol/L (ref 135–145)
Total Bilirubin: 0.6 mg/dL (ref 0.3–1.2)
Total Protein: 8 g/dL (ref 6.5–8.1)

## 2019-04-04 LAB — CBC
HCT: 39.5 % (ref 36.0–46.0)
Hemoglobin: 13.4 g/dL (ref 12.0–15.0)
MCH: 30.9 pg (ref 26.0–34.0)
MCHC: 33.9 g/dL (ref 30.0–36.0)
MCV: 91.2 fL (ref 80.0–100.0)
Platelets: 263 10*3/uL (ref 150–400)
RBC: 4.33 MIL/uL (ref 3.87–5.11)
RDW: 12.8 % (ref 11.5–15.5)
WBC: 7.8 10*3/uL (ref 4.0–10.5)
nRBC: 0 % (ref 0.0–0.2)

## 2019-04-04 LAB — TROPONIN I (HIGH SENSITIVITY): Troponin I (High Sensitivity): 4 ng/L (ref <18)

## 2019-04-04 NOTE — ED Notes (Signed)
Pt taken to xray 

## 2019-04-04 NOTE — ED Notes (Signed)
Family member at bedside.

## 2019-04-04 NOTE — ED Triage Notes (Addendum)
Pt states she was laying in bed and husband states she had seizure like activity. States "was making noises and became out of it and confused". Pt has hx of seizures years ago but stopped taking meds. FSBS by ems 212, pt states she was supposed to be on dilantin but made feet feel "prickly".

## 2019-04-04 NOTE — ED Notes (Signed)
No CT to be done at this time per Dr. Charna Archer

## 2019-04-05 ENCOUNTER — Telehealth: Payer: Self-pay

## 2019-04-05 DIAGNOSIS — R569 Unspecified convulsions: Secondary | ICD-10-CM | POA: Diagnosis not present

## 2019-04-05 LAB — SARS CORONAVIRUS 2 (TAT 6-24 HRS): SARS Coronavirus 2: NEGATIVE

## 2019-04-05 LAB — URINE DRUG SCREEN, QUALITATIVE (ARMC ONLY)
Amphetamines, Ur Screen: NOT DETECTED
Barbiturates, Ur Screen: NOT DETECTED
Benzodiazepine, Ur Scrn: NOT DETECTED
Cannabinoid 50 Ng, Ur ~~LOC~~: NOT DETECTED
Cocaine Metabolite,Ur ~~LOC~~: NOT DETECTED
MDMA (Ecstasy)Ur Screen: NOT DETECTED
Methadone Scn, Ur: NOT DETECTED
Opiate, Ur Screen: NOT DETECTED
Phencyclidine (PCP) Ur S: NOT DETECTED
Tricyclic, Ur Screen: NOT DETECTED

## 2019-04-05 LAB — URINALYSIS, COMPLETE (UACMP) WITH MICROSCOPIC
Bilirubin Urine: NEGATIVE
Glucose, UA: NEGATIVE mg/dL
Hgb urine dipstick: NEGATIVE
Ketones, ur: NEGATIVE mg/dL
Leukocytes,Ua: NEGATIVE
Nitrite: NEGATIVE
Protein, ur: 30 mg/dL — AB
Specific Gravity, Urine: 1.029 (ref 1.005–1.030)
pH: 6 (ref 5.0–8.0)

## 2019-04-05 MED ORDER — LEVETIRACETAM 500 MG PO TABS: 500.0000 mg | ORAL_TABLET | Freq: Two times a day (BID) | ORAL | 0 refills | Status: DC

## 2019-04-05 MED ORDER — SODIUM CHLORIDE 0.9 % IV BOLUS
1000.0000 mL | Freq: Once | INTRAVENOUS | Status: AC
  Administered 2019-04-05: 1000 mL via INTRAVENOUS

## 2019-04-05 MED ORDER — LEVETIRACETAM 500 MG PO TABS
500.0000 mg | ORAL_TABLET | Freq: Once | ORAL | Status: AC
  Administered 2019-04-05: 01:00:00 500 mg via ORAL
  Filled 2019-04-05: qty 1

## 2019-04-05 NOTE — ED Provider Notes (Signed)
[facility] Emergency Department Provider Note   ____________________________________________   First MD Initiated Contact with Patient 04/05/19 0003     (approximate)  I have reviewed the triage vital signs and the nursing notes.   HISTORY  Chief Complaint Seizures    HPI [name] is a 48 y.o. female brought to the ED from home status post seizure-like activity.  Patient has a history of seizures, last seizure multiple years ago.  Stopped taking her Dilantin because it made her feet feel "prickly".  Has been on prednisone, azithromycin and duo nebs for asthma exacerbation this week with bronchitis.  She has been having dry cough, wheezing and shortness of breath.  No Covid 19 testing done by her PCP.  She was laying in her bed and husband states she had seizure-like activity.  Describes tonic-clonic seizure lasting less than 2 minutes with postictal state.  Did not bite tongue or suffer urinary incontinence.  Denies fever, chest pain, abdominal pain, nausea, vomiting, diarrhea.       Past Medical History:  Diagnosis Date  . Allergy    environmental  . Anemia   . Asthma   . Bronchitis, acute   . Complication of anesthesia    HAD ASTHMA ATTACK WHILE UNDER  . Gastroesophageal reflux disease without esophagitis 11/02/2017  . Hypertension   . Sleep apnea     Patient Active Problem List   Diagnosis Date Noted  . Acute upper respiratory infection 04/03/2019  . Screening for breast cancer 02/07/2019  . Cervical disc disorder with radiculopathy 02/07/2019  . Hypokalemia 02/07/2019  . Dysuria 02/07/2019  . Acute recurrent pansinusitis 09/13/2018  . Inflammatory pain of left heel 11/02/2017  . Gastroesophageal reflux disease without esophagitis 11/02/2017  . Asthma in adult 11/02/2017  . Essential hypertension 11/02/2017  . Postoperative state 06/09/2015  . 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  ABDOMINAL HYSTERECTOMY N/A 06/09/2015   Procedure: HYSTERECTOMY ABDOMINAL with Bilateral Salpingectomy;  Surgeon: Suzy Bouchard, MD;  Location: [facility] ORS;  Service: Gynecology;  Laterality: N/A;  . CESAREAN SECTION  [id number]  . FINGER SURGERY  1992  . GASTRIC BYPASS    . INGUINAL HERNIA REPAIR Right 2010  . REPLACEMENT TOTAL KNEE Right     Prior to Admission medications   Medication Sig Start Date End Date Taking? Authorizing Provider  acetaminophen (TYLENOL) 500 MG tablet Take 500 mg by mouth every 6 (six) hours as needed.    [provider]  albuterol (PROVENTIL HFA;VENTOLIN HFA) 108 (90 BASE) MCG/ACT inhaler Inhale 2 puffs into the lungs every 6 (six) hours as needed for wheezing or shortness of breath.    [provider]  azithromycin (ZITHROMAX) 250 MG tablet z-pack - take as directed for 5 days for acute upper respiratory infection. 04/03/19   Carlean Jews, NP  b complex vitamins tablet Take 1 tablet by mouth daily.    [provider]  budesonide-formoterol (SYMBICORT) 160-4.5 MCG/ACT inhaler Inhale 2 puffs into the lungs every 12 (twelve) hours.    [provider]  calcium-vitamin D (OSCAL WITH D) 500-200 MG-UNIT per tablet Take 1 tablet by mouth.    [provider]  Cholecalciferol (VITAMIN D-3 PO) Take 5,000 Int'l Units by mouth daily.    [provider]  ELDERBERRY PO Take by mouth.    [provider]  Ferrous Sulfate 27 MG TABS Take 1 tablet by mouth 2 (two) times  daily.    [provider]  gabapentin (NEURONTIN) 300 MG capsule Take 300 mg by mouth daily.    [provider]  hydrochlorothiazide (HYDRODIURIL) 12.5 MG tablet Take 1 tablet by mouth once daily 03/26/19   Carlean JewsBoscia, Heather E, NP  ipratropium-albuterol (DUONEB) 0.5-2.5 (3) MG/3ML SOLN USE 1 AMPULE IN NEBULIZER EVERY 4 HOURS AS NEEDED 04/02/19   Johnna AcostaScarboro, Adam J, NP  Boris LownKrill Oil (OMEGA-3) 500 MG CAPS Take by mouth.    [provider]  levETIRAcetam (KEPPRA) 500 MG tablet Take 1 tablet (500 mg total) by mouth 2 (two) times daily. 04/05/19   Irean HongSung, Fern Asmar J, MD  lubiprostone (AMITIZA) 24 MCG capsule Take 1 capsule (24 mcg total) by mouth 2 (two) times daily with a meal. 01/22/19   Scarboro, Coralee NorthAdam J, NP  metoprolol succinate (TOPROL-XL) 50 MG 24 hr tablet TAKE 1 TABLET BY MOUTH IN THE MORNING FOR BLOOD PRESSURE 03/26/19   Carlean JewsBoscia, Heather E, NP  montelukast (SINGULAIR) 10 MG tablet Take 10 mg by mouth daily.    [provider]  omeprazole (PRILOSEC) 40 MG capsule Take 40 mg by mouth daily.    [provider]  potassium chloride SA (K-DUR) 20 MEQ tablet Take 1 tablet (20 mEq total) by mouth daily. 02/06/19   Carlean JewsBoscia, Heather E, NP  predniSONE (STERAPRED UNI-PAK 21 TAB) 10 MG (21) TBPK tablet 6 day taper - take by mouth as directed for 6 days 04/03/19   Carlean JewsBoscia, Heather E, NP  tiZANidine (ZANAFLEX) 2 MG tablet Take 2 mg by mouth 3 (three) times daily. 03/24/19   [provider]    Allergies Patient has no known allergies.  Family History  Problem Relation Age of Onset  . Diabetes Mother   . Hypertension Mother   . Cancer Mother   . Congestive Heart Failure Father   . Diabetes Father     Social History Social History   Tobacco Use  . Smoking status: Never Smoker  . Smokeless tobacco: Never Used  Substance Use Topics  . Alcohol use: No  . Drug use: No    Review of Systems  Constitutional: No fever/chills Eyes: No visual changes. ENT: No sore throat. Cardiovascular: Denies chest pain. Respiratory: Denies shortness of breath. Gastrointestinal: No abdominal pain.  No nausea, no vomiting.  No diarrhea.  No constipation. Genitourinary: Negative for dysuria. Musculoskeletal: Negative for back pain. Skin: Negative for rash. Neurological: Positive for seizure-like activity.  Negative for headaches, focal weakness or numbness.   ____________________________________________    PHYSICAL EXAM:  VITAL SIGNS: ED Triage Vitals  Enc Vitals Group     BP 04/04/19 2017 124/72     Pulse Rate 04/04/19 2017 (!) 105     Resp 04/04/19 2017 20     Temp 04/04/19 2017 98.9 F (37.2 C)     Temp Source 04/04/19 2017 Oral     SpO2 04/04/19 2017 99 %     Weight 04/04/19 2019 257 lb (116.6 kg)     Height 04/04/19 2019 5\' 2"  (1.575 m)     Head Circumference --      Peak Flow --      Pain Score 04/04/19 2019 0     Pain Loc --      Pain Edu? --      Excl. in GC? --     Constitutional: Alert and oriented. Well appearing and in no acute distress. Eyes: Conjunctivae are normal. PERRL. EOMI. Head: Atraumatic. Nose: No congestion/rhinnorhea. Mouth/Throat: Mucous membranes  are moist.  Oropharynx non-erythematous. Neck: No stridor.  Supple neck without meningismus. Cardiovascular: Normal rate, regular rhythm. Grossly normal heart sounds.  Good peripheral circulation. Respiratory: Normal respiratory effort.  No retractions. Lungs CTAB. Gastrointestinal: Soft and nontender. No distention. No abdominal bruits. No CVA tenderness. Musculoskeletal: No lower extremity tenderness nor edema.  No joint effusions. Neurologic: Alert and oriented x3.  Normal speech and language. No gross focal neurologic deficits are appreciated. MAEx4. Skin:  Skin is warm, dry and intact. No rash noted. Psychiatric: Mood and affect are normal. Speech and behavior are normal.  ____________________________________________   LABS (all labs ordered are listed, but only abnormal results are displayed)  Labs Reviewed  COMPREHENSIVE METABOLIC PANEL - Abnormal; Notable for the following components:      Result Value   Glucose, Bld 210 (*)    Albumin 3.4 (*)    All other components within normal limits  URINALYSIS, COMPLETE (UACMP) WITH MICROSCOPIC - Abnormal; Notable for the following components:   Color, Urine YELLOW (*)    APPearance HAZY (*)    Protein, ur 30 (*)    Bacteria, UA MANY (*)    All other  components within normal limits  SARS CORONAVIRUS 2 (TAT 6-24 HRS)  CBC  URINE DRUG SCREEN, QUALITATIVE ([facility] ONLY)  TROPONIN I (HIGH SENSITIVITY)   ____________________________________________  EKG  ED ECG REPORT I, Graysin Luczynski J, the attending physician, personally viewed and interpreted this ECG.   Date: 04/05/2019  EKG Time: 2024  Rate: 105  Rhythm: sinus tachycardia  Axis: Normal  Intervals:none  ST&T Change: Nonspecific  ____________________________________________  RADIOLOGY  ED MD interpretation: No ICH, no acute cardiopulmonary process  Official radiology report(s): Dg Chest 2 View  Result Date: 04/05/2019 CLINICAL DATA:  Seizure EXAM: CHEST - 2 VIEW COMPARISON:  08/18/2016 FINDINGS: Heart and mediastinal contours are within normal limits. No focal opacities or effusions. No acute bony abnormality. IMPRESSION: No active cardiopulmonary disease. Electronically Signed   By: Charlett Nose M.D.   On: 04/05/2019 00:06   Ct Head Wo Contrast  Result Date: 04/05/2019 CLINICAL DATA:  Seizure like activity EXAM: CT HEAD WITHOUT CONTRAST TECHNIQUE: Contiguous axial images were obtained from the base of the skull through the vertex without intravenous contrast. COMPARISON:  08/18/2016 FINDINGS: Brain: No acute intracranial abnormality. Specifically, no hemorrhage, hydrocephalus, mass lesion, acute infarction, or significant intracranial injury. Vascular: No hyperdense vessel or unexpected calcification. Skull: No acute calvarial abnormality. Sinuses/Orbits: Visualized paranasal sinuses and mastoids clear. Orbital soft tissues unremarkable. Other: None IMPRESSION: Normal study. Electronically Signed   By: Charlett Nose M.D.   On: 04/05/2019 00:16    ____________________________________________   PROCEDURES  Procedure(s) performed (including Critical Care):  Procedures   ____________________________________________   INITIAL IMPRESSION / ASSESSMENT AND PLAN / ED COURSE  As  part of my medical decision making, I reviewed the following data within the electronic MEDICAL RECORD NUMBER History obtained from family, Nursing notes reviewed and incorporated, Labs reviewed, EKG interpreted, Old chart reviewed, Radiograph reviewed and Notes from prior ED visits     Lyndee E Yinger was evaluated in Emergency Department on 04/05/2019 for the symptoms described in the history of present illness. She was evaluated in the context of the global COVID-19 pandemic, which necessitated consideration that the patient might be at risk for infection with the SARS-CoV-2 virus that causes COVID-19. Institutional protocols and algorithms that pertain to the evaluation of patients at risk for COVID-19 are in a state of rapid change based on information  released by regulatory bodies including the CDC and federal and state organizations. These policies and algorithms were followed during the patient's care in the ED.    48 year old female with a history of seizure disorder who presents status post seizure-like activity.  Differential diagnosis includes but is not limited to [place], seizure, infectious, metabolic, toxicological etiologies, etc.  Patient is well-appearing; laboratory results unremarkable.  CT head and chest x-ray unremarkable.  Discussed with patient who agrees to try [place].  Initiating IV fluids.  Will check send out COVID-19 swab.   Clinical Course as of Apr 05 251  Thu Apr 05, 2019  0250 Patient resting in no acute distress.  Updated patient and spouse of all test results.  Will discharge home on Keppra 500 mg twice daily and refer to neurology for outpatient follow-up.  Strict return precautions given.  Both verbalized understanding and agree with plan of care.   [JS]    Clinical Course User Index [JS] Paulette Blanch, MD     ____________________________________________   FINAL CLINICAL IMPRESSION(S) / ED DIAGNOSES  Final diagnoses:  Seizure [facility])     ED Discharge Orders          Ordered    levETIRAcetam (KEPPRA) 500 MG tablet  2 times daily     04/05/19 0054           Note:  This document was prepared using Dragon voice recognition software and may include unintentional dictation errors.   Paulette Blanch, MD 04/05/19 [phone]

## 2019-04-05 NOTE — Telephone Encounter (Signed)
Pt is currently in the hospital for seizures and pt wanted to know if it is okay for her to be off her prednisone and z-pak. Spoke with Nira Conn and advised pt that her care team at the hospital can take her off anything that they feel is necessary.

## 2019-04-05 NOTE — Discharge Instructions (Signed)
1.  Take Keppra 500 mg twice daily (#60). 2.  Avoid driving, operating heavy machinery, swimming by yourself or other potentially dangerous activities until seen by the neurologist. 3.  Return to the ER for worsening or recurrent symptoms, persistent vomiting, lethargy or other concerns.

## 2019-04-05 NOTE — ED Notes (Signed)
Pt ambulatory to toilet to collect urine sample. Hat placed in toilet.

## 2019-04-05 NOTE — ED Notes (Addendum)
Pt states first seizure in about 20 years. Hasn't taken meds. Husband states unknown seizure time because he left to go grab a neighbor. He states pt was confused afterwards. Denies biting tongue or urinating on self during seizure. States she was lying in bed when seizure began. Did not hit head. Denies pain at this time.   Pt is A&O at this time. Able to answer questions correctly. Able to move all extremities.   Also states she saw PCP Tuesday and was told she has URI, started on prednisone and azithromycin. States hx of asthma. Coughing noted at present.

## 2019-04-05 NOTE — ED Notes (Addendum)
Pt taken to CT   Verbal order to DC second troponin draw per EDP

## 2019-04-09 DIAGNOSIS — G4733 Obstructive sleep apnea (adult) (pediatric): Secondary | ICD-10-CM | POA: Diagnosis not present

## 2019-04-11 ENCOUNTER — Telehealth: Payer: Self-pay | Admitting: Nurse Practitioner

## 2019-04-11 ENCOUNTER — Other Ambulatory Visit: Payer: Self-pay | Admitting: Neurology

## 2019-04-11 DIAGNOSIS — R569 Unspecified convulsions: Secondary | ICD-10-CM | POA: Diagnosis not present

## 2019-04-11 NOTE — Telephone Encounter (Signed)
YES! Absolutely correct! Thanks.

## 2019-04-17 ENCOUNTER — Ambulatory Visit: Payer: BC Managed Care – PPO | Admitting: Internal Medicine

## 2019-04-17 ENCOUNTER — Other Ambulatory Visit: Payer: Self-pay

## 2019-04-17 ENCOUNTER — Encounter: Payer: Self-pay | Admitting: Internal Medicine

## 2019-04-17 DIAGNOSIS — M5010 Cervical disc disorder with radiculopathy, unspecified cervical region: Secondary | ICD-10-CM | POA: Diagnosis not present

## 2019-04-17 DIAGNOSIS — J4540 Moderate persistent asthma, uncomplicated: Secondary | ICD-10-CM | POA: Diagnosis not present

## 2019-04-17 DIAGNOSIS — I10 Essential (primary) hypertension: Secondary | ICD-10-CM

## 2019-04-17 DIAGNOSIS — G40309 Generalized idiopathic epilepsy and epileptic syndromes, not intractable, without status epilepticus: Secondary | ICD-10-CM | POA: Diagnosis not present

## 2019-04-17 MED ORDER — TRIAMTERENE-HCTZ 37.5-25 MG PO TABS: 1.0000 | ORAL_TABLET | Freq: Every day | ORAL | 3 refills | Status: DC

## 2019-04-17 MED ORDER — PHENYTOIN SODIUM EXTENDED 100 MG PO CAPS: ORAL_CAPSULE | ORAL | 3 refills | Status: DC

## 2019-04-17 NOTE — Progress Notes (Signed)
[facility] [address] [place], [place] [address]  Internal MEDICINE  Office Visit Note  Patient Name: [name]  [id number]  [id number]  Date of Service: 04/19/2019  Chief Complaint  Patient presents with  . Hospitalization Follow-up    seizures  . Dizziness  . Medication Management    off of keppra, she had hallucinations, feels like patients seizure episodes were getting worse, right side of face and right arm down to finger feels numbs,  regular hot flashes   . Ear Pain    hurt off and on     Dizziness This is a new (Pt went to ED when had a witnessed seizure ) problem. The current episode started in the past 7 days. The problem occurs rarely. The problem has been gradually improving. Pertinent negatives include no abdominal pain, arthralgias, chest pain, chills, coughing, diaphoresis, fatigue, headaches, nausea, neck pain or vomiting. Associated symptoms comments: Pt was started on Keppra however now having hallucination and stuttering. Daughter thinks this is side effects of this medicine. She has h/o remote sz, was on Dilantin for about one year.  She recently was treated for URI, has asthma as well. Thinks her SZ might be related to asthma. Pt has CPAP machine and uses it on a regular basis. BP is slightly elevated as well. She is taking multiple OTC meds as well including vitamines. She is very upset and tears that she does not want to take keppra  No fever or chills She does not sleep well, thinks might have depression ?? ( bipolar))   Numbness in her arms, has abnormal Cspine MRI  Current Medication: Outpatient Encounter Medications as of 04/17/2019  Medication Sig  . acetaminophen (TYLENOL) 500 MG tablet Take 500 mg by mouth every 6 (six) hours as needed.  Marland Kitchen albuterol (PROVENTIL HFA;VENTOLIN HFA) 108 (90 BASE) MCG/ACT inhaler Inhale 2 puffs into the lungs every 6 (six) hours as needed for wheezing or shortness of breath.  Marland Kitchen azithromycin (ZITHROMAX)  250 MG tablet z-pack - take as directed for 5 days for acute upper respiratory infection.  . budesonide-formoterol (SYMBICORT) 160-4.5 MCG/ACT inhaler Inhale 2 puffs into the lungs every 12 (twelve) hours.  . calcium-vitamin D (OSCAL WITH D) 500-200 MG-UNIT per tablet Take 1 tablet by mouth.  . Cholecalciferol (VITAMIN D-3 PO) Take 5,000 Int'l Units by mouth daily.  Marland Kitchen ELDERBERRY PO Take by mouth.  . gabapentin (NEURONTIN) 300 MG capsule Take 300 mg by mouth daily.  Marland Kitchen ipratropium-albuterol (DUONEB) 0.5-2.5 (3) MG/3ML SOLN USE 1 AMPULE IN NEBULIZER EVERY 4 HOURS AS NEEDED  . Krill Oil (OMEGA-3) 500 MG CAPS Take by mouth.  . levETIRAcetam (KEPPRA) 500 MG tablet Take 1 tablet (500 mg total) by mouth 2 (two) times daily.  Marland Kitchen lubiprostone (AMITIZA) 24 MCG capsule Take 1 capsule (24 mcg total) by mouth 2 (two) times daily with a meal.  . metoprolol succinate (TOPROL-XL) 50 MG 24 hr tablet TAKE 1 TABLET BY MOUTH IN THE MORNING FOR BLOOD PRESSURE  . montelukast (SINGULAIR) 10 MG tablet Take 10 mg by mouth daily.  Marland Kitchen omeprazole (PRILOSEC) 40 MG capsule Take 40 mg by mouth daily.  Marland Kitchen tiZANidine (ZANAFLEX) 2 MG tablet Take 2 mg by mouth 3 (three) times daily.  . [DISCONTINUED] b complex vitamins tablet Take 1 tablet by mouth daily.  . [DISCONTINUED] Ferrous Sulfate 27 MG TABS Take 1 tablet by mouth 2 (two) times daily.  . [DISCONTINUED] hydrochlorothiazide (HYDRODIURIL) 12.5 MG tablet Take 1 tablet by  mouth once daily  . [DISCONTINUED] potassium chloride SA (K-DUR) 20 MEQ tablet Take 1 tablet (20 mEq total) by mouth daily.  . [DISCONTINUED] predniSONE (STERAPRED UNI-PAK 21 TAB) 10 MG (21) TBPK tablet 6 day taper - take by mouth as directed for 6 days  . phenytoin (DILANTIN) 100 MG ER capsule One tab in am and 2 in pm for 2 days, and then 2 in am am and 3 in pm  . triamterene-hydrochlorothiazide (MAXZIDE-25) 37.5-25 MG tablet Take 1 tablet by mouth daily. Take one tab a day for fluid   No facility-administered  encounter medications on file as of 04/17/2019.     Surgical History: Past Surgical History:  Procedure Laterality Date  . ABDOMINAL HYSTERECTOMY N/A 06/09/2015   Procedure: HYSTERECTOMY ABDOMINAL with Bilateral Salpingectomy;  Surgeon: Suzy Bouchardhomas J Schermerhorn, MD;  Location: [facility] ORS;  Service: Gynecology;  Laterality: N/A;  . CESAREAN SECTION  [id number],1993  . FINGER SURGERY  1992  . GASTRIC BYPASS    . INGUINAL HERNIA REPAIR Right 2010  . REPLACEMENT TOTAL KNEE Right     Medical History: Past Medical History:  Diagnosis Date  . Allergy    environmental  . Anemia   . Asthma   . Bronchitis, acute   . Complication of anesthesia    HAD ASTHMA ATTACK WHILE UNDER  . Gastroesophageal reflux disease without esophagitis 11/02/2017  . Hypertension   . Sleep apnea     Family History: Family History  Problem Relation Age of Onset  . Diabetes Mother   . Hypertension Mother   . Cancer Mother   . Congestive Heart Failure Father   . Diabetes Father     Social History   Socioeconomic History  . Marital status: Married    Spouse name: Not on file  . Number of children: Not on file  . Years of education: Not on file  . Highest education level: Not on file  Occupational History  . Not on file  Social Needs  . Financial resource strain: Not on file  . Food insecurity    Worry: Not on file    Inability: Not on file  . Transportation needs    Medical: Not on file    Non-medical: Not on file  Tobacco Use  . Smoking status: Never Smoker  . Smokeless tobacco: Never Used  Substance and Sexual Activity  . Alcohol use: No  . Drug use: No  . Sexual activity: Yes  Lifestyle  . Physical activity    Days per week: Not on file    Minutes per session: Not on file  . Stress: Not on file  Relationships  . Social Musicianconnections    Talks on phone: Not on file    Gets together: Not on file    Attends religious service: Not on file    Active member of club or organization: Not on file     Attends meetings of clubs or organizations: Not on file    Relationship status: Not on file  . Intimate partner violence    Fear of current or ex partner: Not on file    Emotionally abused: Not on file    Physically abused: Not on file    Forced sexual activity: Not on file  Other Topics Concern  . Not on file  Social History Narrative  . Not on file      Review of Systems  Constitutional: Negative for chills, diaphoresis and fatigue.  HENT: Negative for ear pain, postnasal drip and  sinus pressure.   Eyes: Negative for photophobia, discharge, redness, itching and visual disturbance.  Respiratory: Negative for cough, shortness of breath and wheezing.   Cardiovascular: Negative for chest pain, palpitations and leg swelling.  Gastrointestinal: Negative for abdominal pain, constipation, diarrhea, nausea and vomiting.  Genitourinary: Negative for dysuria and flank pain.  Musculoskeletal: Negative for arthralgias, back pain, gait problem and neck pain.  Skin: Negative for color change.  Allergic/Immunologic: Negative for environmental allergies and food allergies.  Neurological: Positive for dizziness and seizures. Negative for headaches.       ?? sz history  Hematological: Does not bruise/bleed easily.  Psychiatric/Behavioral: Negative for agitation, behavioral problems (depression) and hallucinations.    Vital Signs: BP 135/88   Pulse 89   Resp 16   Ht 5\' 2"  (1.575 m)   Wt 259 lb (117.5 kg)   LMP 05/28/2015   SpO2 100%   BMI 47.37 kg/m    Physical Exam Constitutional:      General: She is not in acute distress.    Appearance: She is well-developed. She is not diaphoretic.  HENT:     Head: Normocephalic and atraumatic.     Mouth/Throat:     Pharynx: No oropharyngeal exudate.  Eyes:     Pupils: Pupils are equal, round, and reactive to light.  Neck:     Musculoskeletal: Normal range of motion and neck supple.     Thyroid: No thyromegaly.     Vascular: No JVD.      Trachea: No tracheal deviation.  Cardiovascular:     Rate and Rhythm: Normal rate and regular rhythm.     Heart sounds: Normal heart sounds. No murmur. No friction rub. No gallop.   Pulmonary:     Effort: Pulmonary effort is normal. No respiratory distress.     Breath sounds: No wheezing or rales.  Chest:     Chest wall: No tenderness.  Abdominal:     General: Bowel sounds are normal.     Palpations: Abdomen is soft.  Musculoskeletal: Normal range of motion.  Lymphadenopathy:     Cervical: No cervical adenopathy.  Skin:    General: Skin is warm and dry.  Neurological:     Mental Status: She is alert and oriented to person, place, and time.     Cranial Nerves: No cranial nerve deficit.  Psychiatric:        Behavior: Behavior normal.        Thought Content: Thought content normal.        Judgment: Judgment normal.    Assessment/Plan: 1. Epilepsy, generalized, convulsive (HCC) - She seems to be poor historian, feels frustrated and upset due to side effects of Keppra, will DC it for now, pt agrees to restart Dilantin in low dose. Her daughter is in the room as well. She was instructed to stop all over the counter vitamins. CT scan and MRI both are normal, EEG was ordered by neurology as well, pt might need to see Pych - phenytoin (DILANTIN) 100 MG ER capsule; One tab in am and 2 in pm for 2 days, and then 2 in am am and 3 in pm  Dispense: 120 capsule; Refill: 3  2. Cervical disc disorder with radiculopathy - Numbness in her hands might be due to this, since MR C spine does show disc bulge and narrowing of spinal canal. Pt is to continue her Gabapentin, she wants to take it only at night   3. Essential hypertension - Will DC HCTZ and  potassium supplement, monitor bp at home, check BMP on next visit  - triamterene-hydrochlorothiazide (MAXZIDE-25) 37.5-25 MG tablet; Take 1 tablet by mouth daily. Take one tab a day for fluid  Dispense: 90 tablet; Refill: 3  4. Moderate persistent asthma  without complication - This seems to be stable, need to look into if pt is hyperventilating leading to sz// tetany   General Counseling: Iyanla verbalizes understanding of the findings of todays visit and agrees with plan of treatment. I have discussed any further diagnostic evaluation that may be needed or ordered today. We also reviewed her medications today. she has been encouraged to call the office with any questions or concerns that should arise related to todays visit.   Meds ordered this encounter  Medications  . triamterene-hydrochlorothiazide (MAXZIDE-25) 37.5-25 MG tablet    Sig: Take 1 tablet by mouth daily. Take one tab a day for fluid    Dispense:  90 tablet    Refill:  3  . phenytoin (DILANTIN) 100 MG ER capsule    Sig: One tab in am and 2 in pm for 2 days, and then 2 in am am and 3 in pm    Dispense:  120 capsule    Refill:  3    Time spent:25Minutes  Dr Lavera Guise Internal medicine

## 2019-04-18 ENCOUNTER — Ambulatory Visit
Admission: RE | Admit: 2019-04-18 | Discharge: 2019-04-18 | Disposition: A | Discharge status: Home / Self Care | Payer: BC Managed Care – PPO | Source: Ambulatory Visit | Attending: Neurology | Admitting: Neurology

## 2019-04-18 DIAGNOSIS — R569 Unspecified convulsions: Secondary | ICD-10-CM

## 2019-04-19 ENCOUNTER — Telehealth: Payer: Self-pay

## 2019-04-19 DIAGNOSIS — R569 Unspecified convulsions: Secondary | ICD-10-CM | POA: Diagnosis not present

## 2019-04-19 NOTE — Telephone Encounter (Signed)
Confirmed appointment with patient. klh °

## 2019-04-19 NOTE — Telephone Encounter (Signed)
TRIED TO CALL AND CONFIRM 04-23-19 APPOINTMENT 2 SEPARATE OCCASIONS. VM FULL AND UNABLE TO LMOM.

## 2019-04-23 ENCOUNTER — Other Ambulatory Visit: Payer: Self-pay

## 2019-04-23 ENCOUNTER — Ambulatory Visit: Payer: BC Managed Care – PPO | Admitting: Internal Medicine

## 2019-04-23 ENCOUNTER — Encounter: Payer: Self-pay | Admitting: Nurse Practitioner

## 2019-04-23 ENCOUNTER — Telehealth: Payer: Self-pay

## 2019-04-23 DIAGNOSIS — F324 Major depressive disorder, single episode, in partial remission: Secondary | ICD-10-CM | POA: Diagnosis not present

## 2019-04-23 DIAGNOSIS — G40309 Generalized idiopathic epilepsy and epileptic syndromes, not intractable, without status epilepticus: Secondary | ICD-10-CM

## 2019-04-23 DIAGNOSIS — I10 Essential (primary) hypertension: Secondary | ICD-10-CM

## 2019-04-23 MED ORDER — ESCITALOPRAM OXALATE 10 MG PO TABS: ORAL_TABLET | ORAL | 3 refills | Status: DC

## 2019-04-23 NOTE — Progress Notes (Signed)
[facility] [place], [place] [address]  Internal MEDICINE  Office Visit Note  Patient Name: [name]  [id number]  [id number]  Date of Service: 04/23/2019     Chief Complaint  Patient presents with  . Hypertension  . Gastroesophageal Reflux  . Headache    throbbing pain last time on the left side last night, but not right now   . Insomnia    not sleeping well, pt husband states that pt jerks a lot more in her sleep      HPI Pt is here for recent hospital follow up. She had a witnessed SZ and was taken to ED. She was started on Keppra after her CT scan and MRI of brain was negative. Pt started having hallucination and is very upset and emotional, She has not been sleeping either. Her Keppra was stopped and Dilantin was started. She will see her neurology. EEG is pending as well. She does feel a little better on Dilantin, Has not had any more seizures. Headache is improving. She does feel depressed.    Current Medication: Outpatient Encounter Medications as of 04/23/2019  Medication Sig  . acetaminophen (TYLENOL) 500 MG tablet Take 500 mg by mouth every 6 (six) hours as needed.  Marland Kitchen albuterol (PROVENTIL HFA;VENTOLIN HFA) 108 (90 BASE) MCG/ACT inhaler Inhale 2 puffs into the lungs every 6 (six) hours as needed for wheezing or shortness of breath.  . budesonide-formoterol (SYMBICORT) 160-4.5 MCG/ACT inhaler Inhale 2 puffs into the lungs every 12 (twelve) hours.  . calcium-vitamin D (OSCAL WITH D) 500-200 MG-UNIT per tablet Take 1 tablet by mouth.  . Cholecalciferol (VITAMIN D-3 PO) Take 5,000 Int'l Units by mouth daily.  Marland Kitchen ELDERBERRY PO Take by mouth.  . gabapentin (NEURONTIN) 300 MG capsule Take 300 mg by mouth daily.  Marland Kitchen ipratropium-albuterol (DUONEB) 0.5-2.5 (3) MG/3ML SOLN USE 1 AMPULE IN NEBULIZER EVERY 4 HOURS AS NEEDED  . Krill Oil (OMEGA-3) 500 MG CAPS Take by mouth.  . lubiprostone (AMITIZA) 24 MCG capsule Take 1 capsule (24 mcg total) by mouth  2 (two) times daily with a meal.  . metoprolol succinate (TOPROL-XL) 50 MG 24 hr tablet TAKE 1 TABLET BY MOUTH IN THE MORNING FOR BLOOD PRESSURE  . montelukast (SINGULAIR) 10 MG tablet Take 10 mg by mouth daily.  Marland Kitchen omeprazole (PRILOSEC) 40 MG capsule Take 40 mg by mouth daily.  . phenytoin (DILANTIN) 100 MG ER capsule One tab in am and 2 in pm for 2 days, and then 2 in am am and 3 in pm  . tiZANidine (ZANAFLEX) 2 MG tablet Take 2 mg by mouth 3 (three) times daily.  Marland Kitchen triamterene-hydrochlorothiazide (MAXZIDE-25) 37.5-25 MG tablet Take 1 tablet by mouth daily. Take one tab a day for fluid  . escitalopram (LEXAPRO) 10 MG tablet Take one tab po qd with supper for depression  . [DISCONTINUED] azithromycin (ZITHROMAX) 250 MG tablet z-pack - take as directed for 5 days for acute upper respiratory infection. (Patient not taking: Reported on 04/23/2019)  . [DISCONTINUED] levETIRAcetam (KEPPRA) 500 MG tablet Take 1 tablet (500 mg total) by mouth 2 (two) times daily. (Patient not taking: Reported on 04/23/2019)   No facility-administered encounter medications on file as of 04/23/2019.     Surgical History: Past Surgical History:  Procedure Laterality Date  . ABDOMINAL HYSTERECTOMY N/A 06/09/2015   Procedure: HYSTERECTOMY ABDOMINAL with Bilateral Salpingectomy;  Surgeon: Boykin Nearing, MD;  Location: [facility] ORS;  Service: Gynecology;  Laterality: N/A;  .  CESAREAN SECTION  [id number],1993  . FINGER SURGERY  1992  . GASTRIC BYPASS    . INGUINAL HERNIA REPAIR Right 2010  . REPLACEMENT TOTAL KNEE Right     Medical History: Past Medical History:  Diagnosis Date  . Allergy    environmental  . Anemia   . Asthma   . Bronchitis, acute   . Complication of anesthesia    HAD ASTHMA ATTACK WHILE UNDER  . Gastroesophageal reflux disease without esophagitis 11/02/2017  . Hypertension   . Sleep apnea     Family History: Family History  Problem Relation Age of Onset  . Diabetes Mother   . Hypertension  Mother   . Cancer Mother   . Congestive Heart Failure Father   . Diabetes Father     Social History   Socioeconomic History  . Marital status: Married    Spouse name: Not on file  . Number of children: Not on file  . Years of education: Not on file  . Highest education level: Not on file  Occupational History  . Not on file  Social Needs  . Financial resource strain: Not on file  . Food insecurity    Worry: Not on file    Inability: Not on file  . Transportation needs    Medical: Not on file    Non-medical: Not on file  Tobacco Use  . Smoking status: Never Smoker  . Smokeless tobacco: Never Used  Substance and Sexual Activity  . Alcohol use: No  . Drug use: No  . Sexual activity: Yes  Lifestyle  . Physical activity    Days per week: Not on file    Minutes per session: Not on file  . Stress: Not on file  Relationships  . Social Musicianconnections    Talks on phone: Not on file    Gets together: Not on file    Attends religious service: Not on file    Active member of club or organization: Not on file    Attends meetings of clubs or organizations: Not on file    Relationship status: Not on file  . Intimate partner violence    Fear of current or ex partner: Not on file    Emotionally abused: Not on file    Physically abused: Not on file    Forced sexual activity: Not on file  Other Topics Concern  . Not on file  Social History Narrative  . Not on file   Review of Systems  Constitutional: Negative for chills, diaphoresis and fatigue.  HENT: Negative for ear pain, postnasal drip and sinus pressure.   Eyes: Negative for photophobia, discharge, redness, itching and visual disturbance.  Respiratory: Negative for cough, shortness of breath and wheezing.   Cardiovascular: Negative for chest pain, palpitations and leg swelling.  Gastrointestinal: Negative for abdominal pain, constipation, diarrhea, nausea and vomiting.  Genitourinary: Negative for dysuria and flank pain.   Musculoskeletal: Negative for arthralgias, back pain, gait problem and neck pain.  Skin: Negative for color change.  Allergic/Immunologic: Negative for environmental allergies and food allergies.  Neurological: Negative for dizziness and headaches.  Hematological: Does not bruise/bleed easily.  Psychiatric/Behavioral: Positive for behavioral problems (depression), dysphoric mood, hallucinations (but improving ) and sleep disturbance. Negative for agitation, self-injury and suicidal ideas. The patient is nervous/anxious.     Vital Signs: BP 119/60   Pulse 91   Temp 97.9 F (36.6 C)   Resp 16   Wt 256 lb 12.8 oz (116.5 kg)  LMP 05/28/2015   SpO2 97%   BMI 46.97 kg/m    Physical Exam Constitutional:      General: She is not in acute distress.    Appearance: She is well-developed. She is not diaphoretic.  HENT:     Head: Normocephalic and atraumatic.     Mouth/Throat:     Pharynx: No oropharyngeal exudate.  Eyes:     Pupils: Pupils are equal, round, and reactive to light.  Neck:     Musculoskeletal: Normal range of motion and neck supple.     Thyroid: No thyromegaly.     Vascular: No JVD.     Trachea: No tracheal deviation.  Cardiovascular:     Rate and Rhythm: Normal rate and regular rhythm.     Heart sounds: Normal heart sounds. No murmur. No friction rub. No gallop.   Pulmonary:     Effort: Pulmonary effort is normal. No respiratory distress.     Breath sounds: No wheezing or rales.  Chest:     Chest wall: No tenderness.  Abdominal:     General: Bowel sounds are normal.     Palpations: Abdomen is soft.  Musculoskeletal: Normal range of motion.  Lymphadenopathy:     Cervical: No cervical adenopathy.  Skin:    General: Skin is warm and dry.  Neurological:     Mental Status: She is alert and oriented to person, place, and time.     Cranial Nerves: No cranial nerve deficit.  Psychiatric:        Behavior: Behavior normal.        Thought Content: Thought content  normal.        Judgment: Judgment normal.    Assessment/Plan: 1. Epilepsy, generalized, convulsive (HCC) - Await EEG results and Neurology input, increase Dilantin to 200 mg in am and 300 mg in pm  - TSH + free T4 - Dilantin (Phenytoin) level, total  2. Depression, major, single episode, in partial remission (HCC) - Pt has clinical depression with insomnia, might need to be further evaluated by psych, will start her on Lexapro for now  - escitalopram (LEXAPRO) 10 MG tablet; Take one tab po qd with supper for depression  Dispense: 30 tablet; Refill: 3  3. Essential hypertension - BP is improved, continue on meds as before  - Basic Metabolic Panel (BMET)  General Counseling: Hulda verbalizes understanding of the findings of todays visit and agrees with plan of treatment. I have discussed any further diagnostic evaluation that may be needed or ordered today. We also reviewed her medications today. she has been encouraged to call the office with any questions or concerns that should arise related to todays visit.  Counseling: Driving restriction// work restriction discussed with her daughter. Family is involved in her care. FMLA paper work is filled out as well. She should not work until final and definite diagnosis is made   Orders Placed This Encounter  Procedures  . Basic Metabolic Panel (BMET)  . TSH + free T4  . Dilantin (Phenytoin) level, total   I have reviewed all medical records from hospital follow up including radiology reports and consults from other physicians. Appropriate follow up diagnostics will be scheduled as needed. Patient/ Family understands the plan of treatment. Time spent25 minutes.   Dr Lyndon Code, MD Internal Medicine

## 2019-04-23 NOTE — Telephone Encounter (Signed)
[facility] HAS PAPERWORK FOR 04-23-19 OV. [facility] ADVISED PATIENT TO CALL ON 04-25-19 TO SEE IF IT WAS READY TO BE PICKED UP IF NOT WILL BE READY 04-30-19. ON [facility] DESK.

## 2019-04-25 ENCOUNTER — Other Ambulatory Visit
Admission: RE | Admit: 2019-04-25 | Discharge: 2019-04-25 | Disposition: A | Discharge status: Home / Self Care | Payer: BC Managed Care – PPO | Source: Ambulatory Visit | Attending: Internal Medicine | Admitting: Internal Medicine

## 2019-04-25 DIAGNOSIS — G40309 Generalized idiopathic epilepsy and epileptic syndromes, not intractable, without status epilepticus: Secondary | ICD-10-CM | POA: Insufficient documentation

## 2019-04-25 DIAGNOSIS — I10 Essential (primary) hypertension: Secondary | ICD-10-CM | POA: Insufficient documentation

## 2019-04-25 LAB — TSH: TSH: 1.278 u[IU]/mL (ref 0.350–4.500)

## 2019-04-25 LAB — BASIC METABOLIC PANEL
Anion gap: 10 (ref 5–15)
BUN: 13 mg/dL (ref 6–20)
CO2: 28 mmol/L (ref 22–32)
Calcium: 9.2 mg/dL (ref 8.9–10.3)
Chloride: 99 mmol/L (ref 98–111)
Creatinine, Ser: 0.86 mg/dL (ref 0.44–1.00)
GFR calc Af Amer: >60 mL/min (ref >60)
GFR calc non Af Amer: >60 mL/min (ref >60)
Glucose, Bld: 113 mg/dL — ABNORMAL HIGH (ref 70–99)
Potassium: 3.6 mmol/L (ref 3.5–5.1)
Sodium: 137 mmol/L (ref 135–145)

## 2019-04-25 LAB — PHENYTOIN LEVEL, TOTAL: Phenytoin Lvl: 7.9 ug/mL — ABNORMAL LOW (ref 10.0–20.0)

## 2019-04-25 LAB — T4, FREE: Free T4: 0.71 ng/dL (ref 0.61–1.12)

## 2019-04-30 ENCOUNTER — Telehealth: Payer: Self-pay

## 2019-04-30 NOTE — Telephone Encounter (Signed)
PAPERWORK READY AND PICKED UP BY PATIENTS SPOUSE.

## 2019-05-09 DIAGNOSIS — G4733 Obstructive sleep apnea (adult) (pediatric): Secondary | ICD-10-CM | POA: Diagnosis not present

## 2019-05-10 ENCOUNTER — Ambulatory Visit: Payer: BC Managed Care – PPO | Admitting: Nurse Practitioner

## 2019-05-11 ENCOUNTER — Telehealth: Payer: Self-pay

## 2019-05-11 NOTE — Telephone Encounter (Signed)
Patient advised paperwork ready for pick up for disability claim form, put copy in scan. [name]

## 2019-05-14 ENCOUNTER — Other Ambulatory Visit: Payer: Self-pay

## 2019-05-14 ENCOUNTER — Encounter: Payer: Self-pay | Admitting: Internal Medicine

## 2019-05-15 ENCOUNTER — Ambulatory Visit: Payer: BC Managed Care – PPO | Admitting: Internal Medicine

## 2019-05-17 DIAGNOSIS — R569 Unspecified convulsions: Secondary | ICD-10-CM | POA: Diagnosis not present

## 2019-05-17 DIAGNOSIS — R519 Headache, unspecified: Secondary | ICD-10-CM | POA: Diagnosis not present

## 2019-05-18 ENCOUNTER — Telehealth: Payer: Self-pay

## 2019-05-18 DIAGNOSIS — R519 Headache, unspecified: Secondary | ICD-10-CM | POA: Insufficient documentation

## 2019-05-18 NOTE — Telephone Encounter (Signed)
CONFIRMED AND SCREENED FOR 05-22-19 OV. 

## 2019-05-22 ENCOUNTER — Other Ambulatory Visit: Payer: Self-pay

## 2019-05-22 ENCOUNTER — Ambulatory Visit (INDEPENDENT_AMBULATORY_CARE_PROVIDER_SITE_OTHER): Payer: BC Managed Care – PPO | Admitting: Internal Medicine

## 2019-05-22 ENCOUNTER — Other Ambulatory Visit
Admission: RE | Admit: 2019-05-22 | Discharge: 2019-05-22 | Disposition: A | Discharge status: Home / Self Care | Payer: BC Managed Care – PPO | Source: Ambulatory Visit | Attending: Internal Medicine | Admitting: Internal Medicine

## 2019-05-22 VITALS — BP 125/65 | HR 72 | Resp 16 | Wt 260.0 lb

## 2019-05-22 DIAGNOSIS — Z9989 Dependence on other enabling machines and devices: Secondary | ICD-10-CM

## 2019-05-22 DIAGNOSIS — G40309 Generalized idiopathic epilepsy and epileptic syndromes, not intractable, without status epilepticus: Secondary | ICD-10-CM | POA: Diagnosis not present

## 2019-05-22 DIAGNOSIS — J4540 Moderate persistent asthma, uncomplicated: Secondary | ICD-10-CM

## 2019-05-22 DIAGNOSIS — I10 Essential (primary) hypertension: Secondary | ICD-10-CM | POA: Diagnosis not present

## 2019-05-22 DIAGNOSIS — G4733 Obstructive sleep apnea (adult) (pediatric): Secondary | ICD-10-CM

## 2019-05-22 DIAGNOSIS — F324 Major depressive disorder, single episode, in partial remission: Secondary | ICD-10-CM | POA: Diagnosis not present

## 2019-05-22 LAB — PHENYTOIN LEVEL, TOTAL: Phenytoin Lvl: 11 ug/mL (ref 10.0–20.0)

## 2019-05-22 LAB — TSH: TSH: 1.176 u[IU]/mL (ref 0.350–4.500)

## 2019-05-22 LAB — T4, FREE: Free T4: 0.63 ng/dL (ref 0.61–1.12)

## 2019-05-22 NOTE — Progress Notes (Signed)
[facility] [address] [place], [place] [address]  Internal MEDICINE  Office Visit Note  Patient Name: [name]  [id number]  [id number]  Date of Service: 06/06/2019  Chief Complaint  Patient presents with  . Seizures    follow up  . Hypertension  . Depression  . Results    TSH.T4??    HPI Pt is here with her daughter for follow up. Continues to have sz with no tonic clonic component, she is scheduled to have EEG by neurology. She is on Dilantin, Lexapro was started for GAD and depression as well. Family does see some improvement. On Cpap for OSA . Pt aslo has problems sleeping which is a little better as well. No fever or chills.   Current Medication: Outpatient Encounter Medications as of 05/22/2019  Medication Sig  . acetaminophen (TYLENOL) 500 MG tablet Take 500 mg by mouth every 6 (six) hours as needed.  Marland Kitchen albuterol (PROVENTIL HFA;VENTOLIN HFA) 108 (90 BASE) MCG/ACT inhaler Inhale 2 puffs into the lungs every 6 (six) hours as needed for wheezing or shortness of breath.  . budesonide-formoterol (SYMBICORT) 160-4.5 MCG/ACT inhaler Inhale 2 puffs into the lungs every 12 (twelve) hours.  . calcium-vitamin D (OSCAL WITH D) 500-200 MG-UNIT per tablet Take 1 tablet by mouth.  . Cholecalciferol (VITAMIN D-3 PO) Take 5,000 Int'l Units by mouth daily.  Marland Kitchen ELDERBERRY PO Take by mouth.  . escitalopram (LEXAPRO) 10 MG tablet Take one tab po qd with supper for depression  . gabapentin (NEURONTIN) 300 MG capsule Take 300 mg by mouth daily.  Marland Kitchen ipratropium-albuterol (DUONEB) 0.5-2.5 (3) MG/3ML SOLN USE 1 AMPULE IN NEBULIZER EVERY 4 HOURS AS NEEDED  . Krill Oil (OMEGA-3) 500 MG CAPS Take by mouth.  . lubiprostone (AMITIZA) 24 MCG capsule Take 1 capsule (24 mcg total) by mouth 2 (two) times daily with a meal.  . metoprolol succinate (TOPROL-XL) 50 MG 24 hr tablet TAKE 1 TABLET BY MOUTH IN THE MORNING FOR BLOOD PRESSURE  . montelukast (SINGULAIR) 10 MG tablet Take 10 mg by  mouth daily.  Marland Kitchen omeprazole (PRILOSEC) 40 MG capsule Take 40 mg by mouth daily.  Marland Kitchen tiZANidine (ZANAFLEX) 2 MG tablet Take 2 mg by mouth 3 (three) times daily.  Marland Kitchen triamterene-hydrochlorothiazide (MAXZIDE-25) 37.5-25 MG tablet Take 1 tablet by mouth daily. Take one tab a day for fluid  . [DISCONTINUED] phenytoin (DILANTIN) 100 MG ER capsule One tab in am and 2 in pm for 2 days, and then 2 in am am and 3 in pm   No facility-administered encounter medications on file as of 05/22/2019.    Surgical History: Past Surgical History:  Procedure Laterality Date  . ABDOMINAL HYSTERECTOMY N/A 06/09/2015   Procedure: HYSTERECTOMY ABDOMINAL with Bilateral Salpingectomy;  Surgeon: Suzy Bouchard, MD;  Location: [facility] ORS;  Service: Gynecology;  Laterality: N/A;  . CESAREAN SECTION  [id number]  . FINGER SURGERY  1992  . GASTRIC BYPASS    . INGUINAL HERNIA REPAIR Right 2010  . REPLACEMENT TOTAL KNEE Right     Medical History: Past Medical History:  Diagnosis Date  . Allergy    environmental  . Anemia   . Asthma   . Bronchitis, acute   . Complication of anesthesia    HAD ASTHMA ATTACK WHILE UNDER  . Gastroesophageal reflux disease without esophagitis 11/02/2017  . Hypertension   . Sleep apnea     Family History: Family History  Problem Relation Age of Onset  . Diabetes Mother   .  Hypertension Mother   . Cancer Mother   . Congestive Heart Failure Father   . Diabetes Father     Social History   Socioeconomic History  . Marital status: Married    Spouse name: Not on file  . Number of children: Not on file  . Years of education: Not on file  . Highest education level: Not on file  Occupational History  . Not on file  Tobacco Use  . Smoking status: Never Smoker  . Smokeless tobacco: Never Used  Substance and Sexual Activity  . Alcohol use: No  . Drug use: No  . Sexual activity: Yes  Other Topics Concern  . Not on file  Social History Narrative  . Not on file   Social  Determinants of Health   Financial Resource Strain:   . Difficulty of Paying Living Expenses: Not on file  Food Insecurity:   . Worried About Programme researcher, broadcasting/film/videounning Out of Food in the Last Year: Not on file  . Ran Out of Food in the Last Year: Not on file  Transportation Needs:   . Lack of Transportation (Medical): Not on file  . Lack of Transportation (Non-Medical): Not on file  Physical Activity:   . Days of Exercise per Week: Not on file  . Minutes of Exercise per Session: Not on file  Stress:   . Feeling of Stress : Not on file  Social Connections:   . Frequency of Communication with Friends and Family: Not on file  . Frequency of Social Gatherings with Friends and Family: Not on file  . Attends Religious Services: Not on file  . Active Member of Clubs or Organizations: Not on file  . Attends BankerClub or Organization Meetings: Not on file  . Marital Status: Not on file  Intimate Partner Violence:   . Fear of Current or Ex-Partner: Not on file  . Emotionally Abused: Not on file  . Physically Abused: Not on file  . Sexually Abused: Not on file     Review of Systems  Constitutional: Negative for chills, diaphoresis and fatigue.  HENT: Negative for ear pain, postnasal drip and sinus pressure.   Eyes: Negative for photophobia, discharge, redness, itching and visual disturbance.  Respiratory: Negative for cough, shortness of breath and wheezing.   Cardiovascular: Negative for chest pain, palpitations and leg swelling.  Gastrointestinal: Negative for abdominal pain, constipation, diarrhea, nausea and vomiting.  Genitourinary: Negative for dysuria and flank pain.  Musculoskeletal: Negative for arthralgias, back pain, gait problem and neck pain.  Skin: Negative for color change.  Allergic/Immunologic: Negative for environmental allergies and food allergies.  Neurological: Positive for seizures and weakness. Negative for dizziness and headaches.  Hematological: Does not bruise/bleed easily.   Psychiatric/Behavioral: Positive for confusion, decreased concentration and dysphoric mood. Negative for agitation, behavioral problems (depression) and hallucinations.    Vital Signs: BP 125/65 (BP Location: Left Arm, Patient Position: Sitting, Cuff Size: Large)   Pulse 72   Resp 16   Wt 260 lb (117.9 kg)   LMP 05/28/2015   SpO2 98%   BMI 47.55 kg/m    Physical Exam Constitutional:      General: She is not in acute distress.    Appearance: She is well-developed. She is not diaphoretic.  HENT:     Head: Normocephalic and atraumatic.     Mouth/Throat:     Pharynx: No oropharyngeal exudate.  Eyes:     Pupils: Pupils are equal, round, and reactive to light.  Neck:  Thyroid: No thyromegaly.     Vascular: No JVD.     Trachea: No tracheal deviation.  Cardiovascular:     Rate and Rhythm: Normal rate and regular rhythm.     Heart sounds: Normal heart sounds. No murmur. No friction rub. No gallop.   Pulmonary:     Effort: Pulmonary effort is normal. No respiratory distress.     Breath sounds: No wheezing or rales.  Chest:     Chest wall: No tenderness.  Abdominal:     General: Bowel sounds are normal.     Palpations: Abdomen is soft.  Musculoskeletal:        General: Normal range of motion.     Cervical back: Normal range of motion and neck supple.  Lymphadenopathy:     Cervical: No cervical adenopathy.  Skin:    General: Skin is warm and dry.  Neurological:     Mental Status: She is alert and oriented to person, place, and time.     Cranial Nerves: No cranial nerve deficit.  Psychiatric:        Behavior: Behavior normal.        Thought Content: Thought content normal.        Judgment: Judgment normal.   Assessment/Plan: 1. Epilepsy, generalized, convulsive ([place]) - Await EEG results, will most likely need dual tehrapy, advised not to drive  - Dilantin (Phenytoin) level, total - TSH + free T4; Future  2. Essential hypertension - Controlled with no hypokalemia,  keeps monitoring as well   3. Depression, major, single episode, in partial remission ([place]) - Continue on Lexapro   4. Moderate persistent asthma without complication - Controlled, will need PFT's   5. OSA on CPAP - Good compliance with CPAP  General Counseling: Mija verbalizes understanding of the findings of todays visit and agrees with plan of treatment. I have discussed any further diagnostic evaluation that may be needed or ordered today. We also reviewed her medications today. she has been encouraged to call the office with any questions or concerns that should arise related to todays visit.    Orders Placed This Encounter  Procedures  . Dilantin (Phenytoin) level, total  . TSH + free T4    Time spent:25  Minutes  Dr Lavera Guise Internal medicine

## 2019-05-23 ENCOUNTER — Telehealth: Payer: Self-pay

## 2019-05-23 NOTE — Telephone Encounter (Signed)
Called lmom informing patient of appointment. klh 

## 2019-05-23 NOTE — Progress Notes (Signed)
Her labs looked WNL. Dilantin dose is good. Can you get her daughter's number please

## 2019-05-23 NOTE — Telephone Encounter (Signed)
-----   Message from Lavera Guise, MD sent at 05/23/2019  7:35 AM EST ----- Her labs looked WNL. Dilantin dose is good. Can you get her daughter's number please

## 2019-05-23 NOTE — Telephone Encounter (Signed)
Called pt to notify her that her labs look normal and dilantin level is normal.

## 2019-05-28 ENCOUNTER — Encounter: Payer: Self-pay | Admitting: Internal Medicine

## 2019-05-28 ENCOUNTER — Ambulatory Visit (INDEPENDENT_AMBULATORY_CARE_PROVIDER_SITE_OTHER): Payer: BC Managed Care – PPO | Admitting: Internal Medicine

## 2019-05-28 ENCOUNTER — Other Ambulatory Visit: Payer: Self-pay

## 2019-05-28 VITALS — BP 134/91 | Temp 98.0°F | Ht 62.0 in | Wt 261.0 lb

## 2019-05-28 DIAGNOSIS — J4540 Moderate persistent asthma, uncomplicated: Secondary | ICD-10-CM

## 2019-05-28 DIAGNOSIS — G4733 Obstructive sleep apnea (adult) (pediatric): Secondary | ICD-10-CM | POA: Diagnosis not present

## 2019-05-28 DIAGNOSIS — G40309 Generalized idiopathic epilepsy and epileptic syndromes, not intractable, without status epilepticus: Secondary | ICD-10-CM | POA: Diagnosis not present

## 2019-05-28 DIAGNOSIS — I10 Essential (primary) hypertension: Secondary | ICD-10-CM

## 2019-05-28 DIAGNOSIS — Z9989 Dependence on other enabling machines and devices: Secondary | ICD-10-CM

## 2019-05-28 NOTE — Progress Notes (Signed)
[facility] [address] [place], [place] [address]  Internal MEDICINE  Telephone Visit  Patient Name: [name]  [id number]  [id number]  Date of Service: 05/28/2019  I connected with the patient at 1205 by telephone and verified the patients identity using two identifiers.   I discussed the limitations, risks, security and privacy concerns of performing an evaluation and management service by telephone and the availability of in person appointments. I also discussed with the patient that there may be a patient responsible charge related to the service.  The patient expressed understanding and agrees to proceed.    Chief Complaint  Patient presents with  . Telephone Screen  . Telephone Assessment  . Asthma  . Fatigue    HPI  Pt seen via telephone for pulmonary follow up.  She reports she has been doing well.  Has not needed nebulizer recently.  She does not use inhalers at this time.    She did have a small seizure yesterday, that is described as unresponsive, and stiff.  Family reports it lasted maybe 10 minutes. The patient does not feel like it lasted that long.  Pt reports good compliance with CPAP therapy. Cleaning machine by hand, and changing filters and tubing as directed. Denies headaches, sinus issues, palpitations, or hemoptysis.    Current Medication: Outpatient Encounter Medications as of 05/28/2019  Medication Sig  . acetaminophen (TYLENOL) 500 MG tablet Take 500 mg by mouth every 6 (six) hours as needed.  Marland Kitchen. albuterol (PROVENTIL HFA;VENTOLIN HFA) 108 (90 BASE) MCG/ACT inhaler Inhale 2 puffs into the lungs every 6 (six) hours as needed for wheezing or shortness of breath.  . budesonide-formoterol (SYMBICORT) 160-4.5 MCG/ACT inhaler Inhale 2 puffs into the lungs every 12 (twelve) hours.  . calcium-vitamin D (OSCAL WITH D) 500-200 MG-UNIT per tablet Take 1 tablet by mouth.  . Cholecalciferol (VITAMIN D-3 PO) Take 5,000 Int'l Units by mouth daily.  Marland Kitchen.  ELDERBERRY PO Take by mouth.  . escitalopram (LEXAPRO) 10 MG tablet Take one tab po qd with supper for depression  . gabapentin (NEURONTIN) 300 MG capsule Take 300 mg by mouth daily.  Marland Kitchen. ipratropium-albuterol (DUONEB) 0.5-2.5 (3) MG/3ML SOLN USE 1 AMPULE IN NEBULIZER EVERY 4 HOURS AS NEEDED  . Krill Oil (OMEGA-3) 500 MG CAPS Take by mouth.  . lubiprostone (AMITIZA) 24 MCG capsule Take 1 capsule (24 mcg total) by mouth 2 (two) times daily with a meal.  . metoprolol succinate (TOPROL-XL) 50 MG 24 hr tablet TAKE 1 TABLET BY MOUTH IN THE MORNING FOR BLOOD PRESSURE  . montelukast (SINGULAIR) 10 MG tablet Take 10 mg by mouth daily.  Marland Kitchen. omeprazole (PRILOSEC) 40 MG capsule Take 40 mg by mouth daily.  . phenytoin (DILANTIN) 100 MG ER capsule One tab in am and 2 in pm for 2 days, and then 2 in am am and 3 in pm  . tiZANidine (ZANAFLEX) 2 MG tablet Take 2 mg by mouth 3 (three) times daily.  Marland Kitchen. triamterene-hydrochlorothiazide (MAXZIDE-25) 37.5-25 MG tablet Take 1 tablet by mouth daily. Take one tab a day for fluid   No facility-administered encounter medications on file as of 05/28/2019.    Surgical History: Past Surgical History:  Procedure Laterality Date  . ABDOMINAL HYSTERECTOMY N/A 06/09/2015   Procedure: HYSTERECTOMY ABDOMINAL with Bilateral Salpingectomy;  Surgeon: Suzy Bouchardhomas J Schermerhorn, MD;  Location: [facility] ORS;  Service: Gynecology;  Laterality: N/A;  . CESAREAN SECTION  [id number]  . FINGER SURGERY  1992  . GASTRIC BYPASS    .  INGUINAL HERNIA REPAIR Right 2010  . REPLACEMENT TOTAL KNEE Right     Medical History: Past Medical History:  Diagnosis Date  . Allergy    environmental  . Anemia   . Asthma   . Bronchitis, acute   . Complication of anesthesia    HAD ASTHMA ATTACK WHILE UNDER  . Gastroesophageal reflux disease without esophagitis 11/02/2017  . Hypertension   . Sleep apnea     Family History: Family History  Problem Relation Age of Onset  . Diabetes Mother   . Hypertension  Mother   . Cancer Mother   . Congestive Heart Failure Father   . Diabetes Father     Social History   Socioeconomic History  . Marital status: Married    Spouse name: Not on file  . Number of children: Not on file  . Years of education: Not on file  . Highest education level: Not on file  Occupational History  . Not on file  Tobacco Use  . Smoking status: Never Smoker  . Smokeless tobacco: Never Used  Substance and Sexual Activity  . Alcohol use: No  . Drug use: No  . Sexual activity: Yes  Other Topics Concern  . Not on file  Social History Narrative  . Not on file   Social Determinants of Health   Financial Resource Strain:   . Difficulty of Paying Living Expenses: Not on file  Food Insecurity:   . Worried About Charity fundraiser in the Last Year: Not on file  . Ran Out of Food in the Last Year: Not on file  Transportation Needs:   . Lack of Transportation (Medical): Not on file  . Lack of Transportation (Non-Medical): Not on file  Physical Activity:   . Days of Exercise per Week: Not on file  . Minutes of Exercise per Session: Not on file  Stress:   . Feeling of Stress : Not on file  Social Connections:   . Frequency of Communication with Friends and Family: Not on file  . Frequency of Social Gatherings with Friends and Family: Not on file  . Attends Religious Services: Not on file  . Active Member of Clubs or Organizations: Not on file  . Attends Archivist Meetings: Not on file  . Marital Status: Not on file  Intimate Partner Violence:   . Fear of Current or Ex-Partner: Not on file  . Emotionally Abused: Not on file  . Physically Abused: Not on file  . Sexually Abused: Not on file      Review of Systems  Constitutional: Negative for chills, fatigue and unexpected weight change.  HENT: Negative for congestion, rhinorrhea, sneezing and sore throat.   Eyes: Negative for photophobia, pain and redness.  Respiratory: Negative for cough, chest  tightness and shortness of breath.   Cardiovascular: Negative for chest pain and palpitations.  Gastrointestinal: Negative for abdominal pain, constipation, diarrhea, nausea and vomiting.  Endocrine: Negative.   Genitourinary: Negative for dysuria and frequency.  Musculoskeletal: Negative for arthralgias, back pain, joint swelling and neck pain.  Skin: Negative for rash.  Allergic/Immunologic: Negative.   Neurological: Negative for tremors and numbness.  Hematological: Negative for adenopathy. Does not bruise/bleed easily.  Psychiatric/Behavioral: Negative for behavioral problems and sleep disturbance. The patient is not nervous/anxious.     Vital Signs: BP (!) 134/91   Temp 98 F (36.7 C)   Ht 5\' 2"  (1.575 m)   Wt 261 lb (118.4 kg)   LMP 05/28/2015  BMI 47.74 kg/m    Observation/Objective:  Well sounding, NAD noted.    Assessment/Plan: 1. OSA on CPAP Continue to use Cpap as directed.   2. Moderate persistent asthma without complication Stable, continue to use nebulizer as directed.   3. Essential hypertension Slightly elevated today 134/91, encouraged medication compliance.  Continue to follow.  4. Epilepsy, generalized, convulsive ([facility]) Seizure yesterday, pt has EEG scheduled for 06/06/2018, encouraged her to follow up as directed. Also encouraged her not to drive or work until cleared by neurology.   General Counseling: [name] verbalizes understanding of the findings of today's phone visit and agrees with plan of treatment. I have discussed any further diagnostic evaluation that may be needed or ordered today. We also reviewed her medications today. she has been encouraged to call the office with any questions or concerns that should arise related to todays visit.    No orders of the defined types were placed in this encounter.   No orders of the defined types were placed in this encounter.   Time spent: 21 Minutes    Blima Ledger [facility] Internal medicine

## 2019-05-29 DIAGNOSIS — R569 Unspecified convulsions: Secondary | ICD-10-CM | POA: Diagnosis not present

## 2019-05-29 DIAGNOSIS — R519 Headache, unspecified: Secondary | ICD-10-CM | POA: Diagnosis not present

## 2019-06-05 ENCOUNTER — Other Ambulatory Visit: Payer: Self-pay

## 2019-06-05 DIAGNOSIS — G40309 Generalized idiopathic epilepsy and epileptic syndromes, not intractable, without status epilepticus: Secondary | ICD-10-CM

## 2019-06-05 MED ORDER — PHENYTOIN SODIUM EXTENDED 100 MG PO CAPS: ORAL_CAPSULE | ORAL | 3 refills | Status: DC

## 2019-06-07 DIAGNOSIS — R569 Unspecified convulsions: Secondary | ICD-10-CM | POA: Diagnosis not present

## 2019-06-08 DIAGNOSIS — G4733 Obstructive sleep apnea (adult) (pediatric): Secondary | ICD-10-CM | POA: Diagnosis not present

## 2019-06-13 ENCOUNTER — Telehealth: Payer: Self-pay

## 2019-06-13 NOTE — Telephone Encounter (Signed)
Patient has been advised of disability claim form signed and ready for pick up. [name]

## 2019-06-19 ENCOUNTER — Ambulatory Visit: Payer: BC Managed Care – PPO | Admitting: Internal Medicine

## 2019-06-20 DIAGNOSIS — R569 Unspecified convulsions: Secondary | ICD-10-CM | POA: Diagnosis not present

## 2019-06-20 DIAGNOSIS — G479 Sleep disorder, unspecified: Secondary | ICD-10-CM | POA: Insufficient documentation

## 2019-06-20 DIAGNOSIS — F445 Conversion disorder with seizures or convulsions: Secondary | ICD-10-CM | POA: Diagnosis not present

## 2019-06-20 DIAGNOSIS — R519 Headache, unspecified: Secondary | ICD-10-CM | POA: Diagnosis not present

## 2019-06-29 ENCOUNTER — Telehealth: Payer: Self-pay

## 2019-06-29 NOTE — Telephone Encounter (Signed)
Confirmed virtual visit with patient. klh 

## 2019-07-03 ENCOUNTER — Ambulatory Visit (INDEPENDENT_AMBULATORY_CARE_PROVIDER_SITE_OTHER): Payer: BC Managed Care – PPO | Admitting: Internal Medicine

## 2019-07-03 ENCOUNTER — Encounter: Payer: Self-pay | Admitting: Internal Medicine

## 2019-07-03 VITALS — BP 125/88 | HR 92 | Temp 98.2°F | Ht 62.0 in | Wt 260.0 lb

## 2019-07-03 DIAGNOSIS — G40309 Generalized idiopathic epilepsy and epileptic syndromes, not intractable, without status epilepticus: Secondary | ICD-10-CM | POA: Diagnosis not present

## 2019-07-03 DIAGNOSIS — G4733 Obstructive sleep apnea (adult) (pediatric): Secondary | ICD-10-CM | POA: Diagnosis not present

## 2019-07-03 DIAGNOSIS — I10 Essential (primary) hypertension: Secondary | ICD-10-CM | POA: Diagnosis not present

## 2019-07-03 DIAGNOSIS — F324 Major depressive disorder, single episode, in partial remission: Secondary | ICD-10-CM

## 2019-07-03 DIAGNOSIS — Z9989 Dependence on other enabling machines and devices: Secondary | ICD-10-CM

## 2019-07-03 NOTE — Progress Notes (Signed)
[facility] [place], [place] [address]  Internal MEDICINE  Telephone Visit  Patient Name: [name]  [id number]  [id number]  Date of Service: 07/03/2019  I connected with the patient at 1100 by telephone//Videoand verified the patients identity using two identifiers.   I discussed the limitations, risks, security and privacy concerns of performing an evaluation and management service by telephone and the availability of in person appointments. I also discussed with the patient that there may be a patient responsible charge related to the service.  The patient expressed understanding and agrees to proceed.    Chief Complaint  Patient presents with  . Telephone Assessment  . Telephone Screen  . Seizures    PT IS OFF OF DILANTIN, LAMICTAL INCREASED TO 25 MG,  3 TAB IN AND 3 TAB IN PM, NEXT WEEK 3 IN AM AND 4 PM, WEEK  AFTER 4 TAB AM 4 IN PM AND THAT WILL BE CORRECT DOSAGE   . Hypertension  . Gastroesophageal Reflux    HPI  Pt is feeling much better, she had seen her neurologists and medicine has been adjusted, she has not had any seizures since then, she has been instructed not to be alone and drive at the moment, BP is under good control. No fever or chills, sleeping better, continues to take Lexapro   Current Medication: Outpatient Encounter Medications as of 07/03/2019  Medication Sig  . albuterol (PROVENTIL HFA;VENTOLIN HFA) 108 (90 BASE) MCG/ACT inhaler Inhale 2 puffs into the lungs every 6 (six) hours as needed for wheezing or shortness of breath.  . budesonide-formoterol (SYMBICORT) 160-4.5 MCG/ACT inhaler Inhale 2 puffs into the lungs every 12 (twelve) hours.  Marland Kitchen escitalopram (LEXAPRO) 10 MG tablet Take one tab po qd with supper for depression  . gabapentin (NEURONTIN) 300 MG capsule Take 300 mg by mouth daily.  Marland Kitchen ipratropium-albuterol (DUONEB) 0.5-2.5 (3) MG/3ML SOLN USE 1 AMPULE IN NEBULIZER EVERY 4 HOURS AS NEEDED  . metoprolol succinate  (TOPROL-XL) 50 MG 24 hr tablet TAKE 1 TABLET BY MOUTH IN THE MORNING FOR BLOOD PRESSURE  . phenytoin (DILANTIN) 100 MG ER capsule Take 2 cap in am and 3 cap in pm.  . tiZANidine (ZANAFLEX) 2 MG tablet Take 2 mg by mouth 3 (three) times daily.  Marland Kitchen triamterene-hydrochlorothiazide (MAXZIDE-25) 37.5-25 MG tablet Take 1 tablet by mouth daily. Take one tab a day for fluid  . acetaminophen (TYLENOL) 500 MG tablet Take 500 mg by mouth every 6 (six) hours as needed.  . calcium-vitamin D (OSCAL WITH D) 500-200 MG-UNIT per tablet Take 1 tablet by mouth.  . Cholecalciferol (VITAMIN D-3 PO) Take 5,000 Int'l Units by mouth daily.  Marland Kitchen ELDERBERRY PO Take by mouth.  Javier Docker Oil (OMEGA-3) 500 MG CAPS Take by mouth.  . lubiprostone (AMITIZA) 24 MCG capsule Take 1 capsule (24 mcg total) by mouth 2 (two) times daily with a meal.  . montelukast (SINGULAIR) 10 MG tablet Take 10 mg by mouth daily.  Marland Kitchen omeprazole (PRILOSEC) 40 MG capsule Take 40 mg by mouth daily.   No facility-administered encounter medications on file as of 07/03/2019.    Surgical History: Past Surgical History:  Procedure Laterality Date  . ABDOMINAL HYSTERECTOMY N/A 06/09/2015   Procedure: HYSTERECTOMY ABDOMINAL with Bilateral Salpingectomy;  Surgeon: Boykin Nearing, MD;  Location: [facility] ORS;  Service: Gynecology;  Laterality: N/A;  . CESAREAN SECTION  [id number]  . [place]  . GASTRIC BYPASS    . INGUINAL  HERNIA REPAIR Right 2010  . REPLACEMENT TOTAL KNEE Right     Medical History: Past Medical History:  Diagnosis Date  . Allergy    environmental  . Anemia   . Asthma   . Bronchitis, acute   . Complication of anesthesia    HAD ASTHMA ATTACK WHILE UNDER  . Gastroesophageal reflux disease without esophagitis 11/02/2017  . Hypertension   . Sleep apnea     Family History: Family History  Problem Relation Age of Onset  . Diabetes Mother   . Hypertension Mother   . Cancer Mother   . Congestive Heart Failure Father   .  Diabetes Father     Social History   Socioeconomic History  . Marital status: Married    Spouse name: Not on file  . Number of children: Not on file  . Years of education: Not on file  . Highest education level: Not on file  Occupational History  . Not on file  Tobacco Use  . Smoking status: Never Smoker  . Smokeless tobacco: Never Used  Substance and Sexual Activity  . Alcohol use: No  . Drug use: No  . Sexual activity: Yes  Other Topics Concern  . Not on file  Social History Narrative  . Not on file   Social Determinants of Health   Financial Resource Strain:   . Difficulty of Paying Living Expenses: Not on file  Food Insecurity:   . Worried About Programme researcher, broadcasting/film/video in the Last Year: Not on file  . Ran Out of Food in the Last Year: Not on file  Transportation Needs:   . Lack of Transportation (Medical): Not on file  . Lack of Transportation (Non-Medical): Not on file  Physical Activity:   . Days of Exercise per Week: Not on file  . Minutes of Exercise per Session: Not on file  Stress:   . Feeling of Stress : Not on file  Social Connections:   . Frequency of Communication with Friends and Family: Not on file  . Frequency of Social Gatherings with Friends and Family: Not on file  . Attends Religious Services: Not on file  . Active Member of Clubs or Organizations: Not on file  . Attends Banker Meetings: Not on file  . Marital Status: Not on file  Intimate Partner Violence:   . Fear of Current or Ex-Partner: Not on file  . Emotionally Abused: Not on file  . Physically Abused: Not on file  . Sexually Abused: Not on file    Review of Systems  Constitutional: Negative for chills, diaphoresis and fatigue.  HENT: Negative for ear pain, postnasal drip and sinus pressure.   Eyes: Negative for photophobia, discharge, redness, itching and visual disturbance.  Respiratory: Negative for cough, shortness of breath and wheezing.   Cardiovascular: Negative  for chest pain, palpitations and leg swelling.  Gastrointestinal: Negative for abdominal pain, constipation, diarrhea, nausea and vomiting.  Genitourinary: Negative for dysuria and flank pain.  Musculoskeletal: Negative for arthralgias, back pain, gait problem and neck pain.  Skin: Negative for color change.  Allergic/Immunologic: Negative for environmental allergies and food allergies.  Neurological: Negative for dizziness and headaches.  Hematological: Does not bruise/bleed easily.  Psychiatric/Behavioral: Negative for agitation, behavioral problems (depression) and hallucinations.    Vital Signs: BP 125/88   Pulse 92   Temp 98.2 F (36.8 C)   Ht 5\' 2"  (1.575 m)   Wt 260 lb (117.9 kg)   LMP 05/28/2015   BMI  47.55 kg/m    Observation/Objective: No apparent distress, in pleasant mood   Assessment/Plan: 1. Epilepsy, generalized, convulsive (HCC) Per neurology, she is feeling better with new medications, she is off of Dilantin   2. Essential hypertension Controlled  3. Depression, major, single episode, in partial remission (HCC) Continue Lexapro as before   4. OSA on CPAP Pt is compliant with CPAP  General Counseling: Rosalyn verbalizes understanding of the findings of today's phone visit and agrees with plan of treatment. I have discussed any further diagnostic evaluation that may be needed or ordered today. We also reviewed her medications today. she has been encouraged to call the office with any questions or concerns that should arise related to todays visit.   Time spent: 25 Minutes  Dr Lyndon Code Internal medicine

## 2019-07-09 DIAGNOSIS — G4733 Obstructive sleep apnea (adult) (pediatric): Secondary | ICD-10-CM | POA: Diagnosis not present

## 2019-07-16 DIAGNOSIS — R519 Headache, unspecified: Secondary | ICD-10-CM | POA: Diagnosis not present

## 2019-07-16 DIAGNOSIS — F439 Reaction to severe stress, unspecified: Secondary | ICD-10-CM | POA: Diagnosis not present

## 2019-07-16 DIAGNOSIS — F445 Conversion disorder with seizures or convulsions: Secondary | ICD-10-CM | POA: Diagnosis not present

## 2019-07-16 DIAGNOSIS — G479 Sleep disorder, unspecified: Secondary | ICD-10-CM | POA: Diagnosis not present

## 2019-07-17 DIAGNOSIS — K1121 Acute sialoadenitis: Secondary | ICD-10-CM | POA: Diagnosis not present

## 2019-07-27 ENCOUNTER — Other Ambulatory Visit: Payer: Self-pay | Admitting: Otolaryngology

## 2019-07-27 DIAGNOSIS — K111 Hypertrophy of salivary gland: Secondary | ICD-10-CM

## 2019-08-02 ENCOUNTER — Other Ambulatory Visit: Payer: Self-pay

## 2019-08-02 ENCOUNTER — Encounter: Payer: Self-pay | Admitting: Nurse Practitioner

## 2019-08-02 ENCOUNTER — Ambulatory Visit: Payer: 59 | Admitting: Nurse Practitioner

## 2019-08-02 VITALS — BP 134/87 | HR 117 | Temp 98.0°F | Resp 16 | Ht 62.0 in | Wt 268.2 lb

## 2019-08-02 DIAGNOSIS — G40309 Generalized idiopathic epilepsy and epileptic syndromes, not intractable, without status epilepticus: Secondary | ICD-10-CM | POA: Diagnosis not present

## 2019-08-02 DIAGNOSIS — F324 Major depressive disorder, single episode, in partial remission: Secondary | ICD-10-CM | POA: Insufficient documentation

## 2019-08-02 DIAGNOSIS — R002 Palpitations: Secondary | ICD-10-CM | POA: Diagnosis not present

## 2019-08-02 DIAGNOSIS — R000 Tachycardia, unspecified: Secondary | ICD-10-CM | POA: Diagnosis not present

## 2019-08-02 DIAGNOSIS — R0602 Shortness of breath: Secondary | ICD-10-CM

## 2019-08-02 MED ORDER — METOPROLOL SUCCINATE ER 50 MG PO TB24: ORAL_TABLET | ORAL | 6 refills | Status: DC

## 2019-08-02 NOTE — Progress Notes (Signed)
[facility] [address] [place], [place] [address]  Internal MEDICINE  Office Visit Note  Patient Name: [name]  [id number]  [id number]  Date of Service: 08/02/2019   Pt is here for a sick visit.  Chief Complaint  Patient presents with  . Shortness of Breath    moving short distances, making up the bed, heart feels like its racing   . Weight Gain    gained 8 pounds in a month   . Medication Management    changes made by neurologist, abilify, lamictal, and nortriptyline in [facility] mychart      The patient is here for acute visit. She states that she is having shortness of breath, even with mild exertion. Feels different than exacerbations of asthma. She is also having rapid heart rate. This has been going on for a few weeks and gradually getting more significant. She is also concerned about eight pound weight gain since she was last seen. She denies chest pain or chest pressure. Can feel and even see her heart beat. Denies any new headache. She denies dizziness or any loss oc consciousness. She states that she does not feel bad or anything, she short of breath with mild exertion.        Current Medication:  Outpatient Encounter Medications as of 08/02/2019  Medication Sig  . albuterol (PROVENTIL HFA;VENTOLIN HFA) 108 (90 BASE) MCG/ACT inhaler Inhale 2 puffs into the lungs every 6 (six) hours as needed for wheezing or shortness of breath.  . ARIPiprazole (ABILIFY) 10 MG tablet Take 10 mg by mouth daily.  . budesonide-formoterol (SYMBICORT) 160-4.5 MCG/ACT inhaler Inhale 2 puffs into the lungs every 12 (twelve) hours.  Marland Kitchen escitalopram (LEXAPRO) 10 MG tablet Take one tab po qd with supper for depression  . gabapentin (NEURONTIN) 300 MG capsule Take 300 mg by mouth daily.  Marland Kitchen ipratropium-albuterol (DUONEB) 0.5-2.5 (3) MG/3ML SOLN USE 1 AMPULE IN NEBULIZER EVERY 4 HOURS AS NEEDED  . lamoTRIgine (LAMICTAL) 25 MG tablet Take 25 mg by mouth. Take four tablets po QAM and  five tablets po QPM  . metoprolol succinate (TOPROL-XL) 50 MG 24 hr tablet Take 1.5 tablets po QD  . nortriptyline (PAMELOR) 10 MG capsule Take 10 mg by mouth. Take 1 tablet po QD and 4 tablets po QPM  . tiZANidine (ZANAFLEX) 2 MG tablet Take 2 mg by mouth 3 (three) times daily.  Marland Kitchen triamterene-hydrochlorothiazide (MAXZIDE-25) 37.5-25 MG tablet Take 1 tablet by mouth daily. Take one tab a day for fluid  . [DISCONTINUED] metoprolol succinate (TOPROL-XL) 50 MG 24 hr tablet TAKE 1 TABLET BY MOUTH IN THE MORNING FOR BLOOD PRESSURE   No facility-administered encounter medications on file as of 08/02/2019.      Medical History: Past Medical History:  Diagnosis Date  . Allergy    environmental  . Anemia   . Asthma   . Bronchitis, acute   . Complication of anesthesia    HAD ASTHMA ATTACK WHILE UNDER  . Gastroesophageal reflux disease without esophagitis 11/02/2017  . Hypertension   . Sleep apnea      Today's Vitals   08/02/19 0959  BP: 134/87  Pulse: (!) 117  Resp: 16  Temp: 98 F (36.7 C)  SpO2: 97%  Weight: 268 lb 3.2 oz (121.7 kg)  Height: 5\' 2"  (1.575 m)   Body mass index is 49.05 kg/m.  Review of Systems  Constitutional: Positive for activity change and fatigue. Negative for chills and unexpected weight change.  HENT:  Negative for congestion, postnasal drip, rhinorrhea, sneezing and sore throat.   Respiratory: Positive for chest tightness and shortness of breath. Negative for cough.   Cardiovascular: Positive for palpitations. Negative for chest pain.       Rapid pulse and shortness of breath with exertion.   Gastrointestinal: Negative for abdominal distention, abdominal pain, constipation, diarrhea, nausea and vomiting.  Musculoskeletal: Negative for arthralgias, back pain, joint swelling and neck pain.  Skin: Negative for rash.  Allergic/Immunologic: Negative for environmental allergies.  Neurological: Positive for seizures. Negative for tremors and numbness.        Patient currently being seen per neurology due to pseudoseizures.   Hematological: Negative for adenopathy. Does not bruise/bleed easily.  Psychiatric/Behavioral: Negative for behavioral problems (Depression), sleep disturbance and suicidal ideas. The patient is not nervous/anxious.     Physical Exam Vitals and nursing note reviewed.  Constitutional:      General: She is not in acute distress.    Appearance: Normal appearance. She is well-developed. She is obese. She is not diaphoretic.  HENT:     Head: Normocephalic and atraumatic.     Nose: Nose normal.     Mouth/Throat:     Pharynx: No oropharyngeal exudate.  Eyes:     Pupils: Pupils are equal, round, and reactive to light.  Neck:     Thyroid: No thyromegaly.     Vascular: No JVD.     Trachea: No tracheal deviation.  Cardiovascular:     Rate and Rhythm: Normal rate and regular rhythm.     Heart sounds: Normal heart sounds. No murmur. No friction rub. No gallop.      Comments: ECG showing sinus tachycardia and normal sinus rhythm. Pulmonary:     Effort: Pulmonary effort is normal. No respiratory distress.     Breath sounds: Normal breath sounds. No wheezing or rales.  Chest:     Chest wall: No tenderness.  Abdominal:     Palpations: Abdomen is soft.  Musculoskeletal:        General: Normal range of motion.     Cervical back: Normal range of motion and neck supple.  Lymphadenopathy:     Cervical: No cervical adenopathy.  Skin:    General: Skin is warm and dry.  Neurological:     General: No focal deficit present.     Mental Status: She is alert and oriented to person, place, and time.     Cranial Nerves: No cranial nerve deficit.  Psychiatric:        Behavior: Behavior normal.        Thought Content: Thought content normal.        Judgment: Judgment normal.    Assessment/Plan: 1. SOB (shortness of breath) Clear breath sounds, likely due to new tachycardia.   2. Palpitations - EKG 12-Lead showing sinus  tachycardia with no other abnormalities.   3. Sinus tachycardia Increase Toprol XL to 75mg  daily. Advised her to monitor blood pressure and heart rate closely at home.  - metoprolol succinate (TOPROL-XL) 50 MG 24 hr tablet; Take 1.5 tablets po QD  Dispense: 45 tablet; Refill: 6  4. Epilepsy, generalized, convulsive ([place]) Updated medication list which now includes lamictal, nortriptyline, and abilify. Reviewed common side effects of these medications. Of note, nortriptyline has palpitations and tachycardia listed as possible side effects. Patient to see neurologist next week and will discuss the potential of medications causing currently symptoms   5. Depression, major, single episode, in partial remission ([place]) Continue escitalopram as prescribed.  General Counseling: Caressa verbalizes understanding of the findings of todays visit and agrees with plan of treatment. I have discussed any further diagnostic evaluation that may be needed or ordered today. We also reviewed her medications today. she has been encouraged to call the office with any questions or concerns that should arise related to todays visit.    Counseling:  Hypertension Counseling:   The following hypertensive lifestyle modification were recommended and discussed:  1. Limiting alcohol intake to less than 1 oz/day of ethanol:(24 oz of beer or 8 oz of wine or 2 oz of 100-proof whiskey). 2. Take baby ASA 81 mg daily. 3. Importance of regular aerobic exercise and losing weight. 4. Reduce dietary saturated fat and cholesterol intake for overall cardiovascular health. 5. Maintaining adequate dietary potassium, calcium, and magnesium intake. 6. Regular monitoring of the blood pressure. 7. Reduce sodium intake to less than 100 mmol/day (less than 2.3 gm of sodium or less than 6 gm of sodium choride)   This patient was seen by [name] Gros FNP Collaboration with Dr Lyndon Code as a part of collaborative care  agreement  Orders Placed This Encounter  Procedures  . EKG 12-Lead    Meds ordered this encounter  Medications  . metoprolol succinate (TOPROL-XL) 50 MG 24 hr tablet    Sig: Take 1.5 tablets po QD    Dispense:  45 tablet    Refill:  6    Increased dose due to sinus tachycardia.    Order Specific Question:   Supervising Provider    Answer:   Lyndon Code [[id number]]    Time spent: 45 Minutes

## 2019-08-02 NOTE — Progress Notes (Deleted)
[facility] [address] [place], [place] [address]  Internal MEDICINE  Office Visit Note  Patient Name: [name]  [id number]  [id number]  Date of Service: 08/02/2019  Chief Complaint  Patient presents with  . Shortness of Breath    moving short distances, making up the bed, heart feels like its racing   . Weight Gain    gained 8 pounds in a month   . Medication Management    changes made by neurologist, abilify, lamictal, and nortriptyline in [facility] mychart     HPI     Current Medication: Outpatient Encounter Medications as of 08/02/2019  Medication Sig  . albuterol (PROVENTIL HFA;VENTOLIN HFA) 108 (90 BASE) MCG/ACT inhaler Inhale 2 puffs into the lungs every 6 (six) hours as needed for wheezing or shortness of breath.  . budesonide-formoterol (SYMBICORT) 160-4.5 MCG/ACT inhaler Inhale 2 puffs into the lungs every 12 (twelve) hours.  Marland Kitchen escitalopram (LEXAPRO) 10 MG tablet Take one tab po qd with supper for depression  . gabapentin (NEURONTIN) 300 MG capsule Take 300 mg by mouth daily.  Marland Kitchen ipratropium-albuterol (DUONEB) 0.5-2.5 (3) MG/3ML SOLN USE 1 AMPULE IN NEBULIZER EVERY 4 HOURS AS NEEDED  . metoprolol succinate (TOPROL-XL) 50 MG 24 hr tablet TAKE 1 TABLET BY MOUTH IN THE MORNING FOR BLOOD PRESSURE  . tiZANidine (ZANAFLEX) 2 MG tablet Take 2 mg by mouth 3 (three) times daily.  Marland Kitchen triamterene-hydrochlorothiazide (MAXZIDE-25) 37.5-25 MG tablet Take 1 tablet by mouth daily. Take one tab a day for fluid   No facility-administered encounter medications on file as of 08/02/2019.    Surgical History: Past Surgical History:  Procedure Laterality Date  . ABDOMINAL HYSTERECTOMY N/A 06/09/2015   Procedure: HYSTERECTOMY ABDOMINAL with Bilateral Salpingectomy;  Surgeon: Jessica Bouchard, MD;  Location: [facility] ORS;  Service: Gynecology;  Laterality: N/A;  . CESAREAN SECTION  [id number]  . FINGER SURGERY  1992  . GASTRIC BYPASS    . INGUINAL HERNIA REPAIR Right 2010  .  REPLACEMENT TOTAL KNEE Right     Medical History: Past Medical History:  Diagnosis Date  . Allergy    environmental  . Anemia   . Asthma   . Bronchitis, acute   . Complication of anesthesia    HAD ASTHMA ATTACK WHILE UNDER  . Gastroesophageal reflux disease without esophagitis 11/02/2017  . Hypertension   . Sleep apnea     Family History: Family History  Problem Relation Age of Onset  . Diabetes Mother   . Hypertension Mother   . Cancer Mother   . Congestive Heart Failure Father   . Diabetes Father     Social History   Socioeconomic History  . Marital status: Married    Spouse name: Not on file  . Number of children: Not on file  . Years of education: Not on file  . Highest education level: Not on file  Occupational History  . Not on file  Tobacco Use  . Smoking status: Never Smoker  . Smokeless tobacco: Never Used  Substance and Sexual Activity  . Alcohol use: No  . Drug use: No  . Sexual activity: Yes  Other Topics Concern  . Not on file  Social History Narrative  . Not on file   Social Determinants of Health   Financial Resource Strain:   . Difficulty of Paying Living Expenses: Not on file  Food Insecurity:   . Worried About Programme researcher, broadcasting/film/video in the Last Year: Not on file  . Ran  Out of Food in the Last Year: Not on file  Transportation Needs:   . Lack of Transportation (Medical): Not on file  . Lack of Transportation (Non-Medical): Not on file  Physical Activity:   . Days of Exercise per Week: Not on file  . Minutes of Exercise per Session: Not on file  Stress:   . Feeling of Stress : Not on file  Social Connections:   . Frequency of Communication with Friends and Family: Not on file  . Frequency of Social Gatherings with Friends and Family: Not on file  . Attends Religious Services: Not on file  . Active Member of Clubs or Organizations: Not on file  . Attends Archivist Meetings: Not on file  . Marital Status: Not on file   Intimate Partner Violence:   . Fear of Current or Ex-Partner: Not on file  . Emotionally Abused: Not on file  . Physically Abused: Not on file  . Sexually Abused: Not on file      Review of Systems  Vital Signs: BP 134/87   Pulse (!) 117   Temp 98 F (36.7 C)   Resp 16   Ht 5\' 2"  (1.575 m)   Wt 268 lb 3.2 oz (121.7 kg)   LMP 05/28/2015   SpO2 97%   BMI 49.05 kg/m    Physical Exam     Assessment/Plan:   General Counseling: [name] verbalizes understanding of the findings of todays visit and agrees with plan of treatment. I have discussed any further diagnostic evaluation that may be needed or ordered today. We also reviewed her medications today. she has been encouraged to call the office with any questions or concerns that should arise related to todays visit.    No orders of the defined types were placed in this encounter.   No orders of the defined types were placed in this encounter.   Total time spent:*** Minutes Time spent includes review of chart, medications, test results, and follow up plan with the patient.      Dr Lavera Guise Internal medicine

## 2019-08-07 ENCOUNTER — Ambulatory Visit: Payer: BC Managed Care – PPO

## 2019-08-07 ENCOUNTER — Ambulatory Visit
Admission: RE | Admit: 2019-08-07 | Discharge: 2019-08-07 | Disposition: A | Discharge status: Home / Self Care | Payer: 59 | Source: Ambulatory Visit | Attending: Otolaryngology | Admitting: Otolaryngology

## 2019-08-07 ENCOUNTER — Other Ambulatory Visit: Payer: Self-pay

## 2019-08-07 DIAGNOSIS — K111 Hypertrophy of salivary gland: Secondary | ICD-10-CM | POA: Insufficient documentation

## 2019-08-07 LAB — POCT I-STAT CREATININE: Creatinine, Ser: 1 mg/dL (ref 0.44–1.00)

## 2019-08-07 MED ORDER — IOHEXOL 300 MG/ML  SOLN
75.0000 mL | Freq: Once | INTRAMUSCULAR | Status: AC | PRN
  Administered 2019-08-07: 75 mL via INTRAVENOUS

## 2019-08-09 ENCOUNTER — Telehealth: Payer: Self-pay

## 2019-08-09 NOTE — Telephone Encounter (Signed)
VM FULL UNABLE TO LMOM TO CONFIRM AND SCREEN FOR 08-13-19 OV.

## 2019-08-13 ENCOUNTER — Ambulatory Visit (INDEPENDENT_AMBULATORY_CARE_PROVIDER_SITE_OTHER): Payer: 59 | Admitting: Nurse Practitioner

## 2019-08-13 ENCOUNTER — Encounter: Payer: Self-pay | Admitting: Nurse Practitioner

## 2019-08-13 VITALS — BP 124/76 | HR 101 | Temp 97.6°F | Resp 16 | Ht 62.0 in | Wt 268.2 lb

## 2019-08-13 DIAGNOSIS — G40309 Generalized idiopathic epilepsy and epileptic syndromes, not intractable, without status epilepticus: Secondary | ICD-10-CM | POA: Diagnosis not present

## 2019-08-13 DIAGNOSIS — R0602 Shortness of breath: Secondary | ICD-10-CM | POA: Diagnosis not present

## 2019-08-13 DIAGNOSIS — R000 Tachycardia, unspecified: Secondary | ICD-10-CM

## 2019-08-13 DIAGNOSIS — R300 Dysuria: Secondary | ICD-10-CM

## 2019-08-13 LAB — POCT URINALYSIS DIPSTICK
Bilirubin, UA: NEGATIVE
Blood, UA: NEGATIVE
Glucose, UA: NEGATIVE
Ketones, UA: NEGATIVE
Leukocytes, UA: NEGATIVE
Nitrite, UA: NEGATIVE
Protein, UA: NEGATIVE
Spec Grav, UA: 1.025 (ref 1.010–1.025)
Urobilinogen, UA: 0.2 E.U./dL
pH, UA: 5 (ref 5.0–8.0)

## 2019-08-13 NOTE — Progress Notes (Signed)
[facility] [place], [place] [address]  Internal MEDICINE  Office Visit Note  Patient Name: [name]  [id number]  [id number]  Date of Service: 08/19/2019  Chief Complaint  Patient presents with  . Follow-up    increased metoprolol, possible need of echo   . Urinary Tract Infection    sometimes has to push on or shake stomach to urinate   . Shortness of Breath    The patient is here for follow up. Increased metoprolol Er to 75mg  daily. She had sinus tachycardia at her last visit. This has improved, though heart rate is still mildly elevated. She states that she is still getting short of breath with exertion. She states that she has been very sedentary for past several months. She had to go out of work due to new onset of seizures back in November. She has just started back to work. Getting tired by the end of the day, but is doing ok, overall.       Current Medication: Outpatient Encounter Medications as of 08/13/2019  Medication Sig  . albuterol (PROVENTIL HFA;VENTOLIN HFA) 108 (90 BASE) MCG/ACT inhaler Inhale 2 puffs into the lungs every 6 (six) hours as needed for wheezing or shortness of breath.  . ARIPiprazole (ABILIFY) 10 MG tablet Take 10 mg by mouth daily.  . budesonide-formoterol (SYMBICORT) 160-4.5 MCG/ACT inhaler Inhale 2 puffs into the lungs every 12 (twelve) hours.  . gabapentin (NEURONTIN) 300 MG capsule Take 300 mg by mouth daily.  Marland Kitchen ipratropium-albuterol (DUONEB) 0.5-2.5 (3) MG/3ML SOLN USE 1 AMPULE IN NEBULIZER EVERY 4 HOURS AS NEEDED  . lamoTRIgine (LAMICTAL) 25 MG tablet Take 25 mg by mouth. Take four tablets po QAM and five tablets po QPM  . metoprolol succinate (TOPROL-XL) 50 MG 24 hr tablet Take 1.5 tablets po QD  . nortriptyline (PAMELOR) 10 MG capsule Take 10 mg by mouth. Take 1 tablet po QD and 4 tablets po QPM  . tiZANidine (ZANAFLEX) 2 MG tablet Take 2 mg by mouth 3 (three) times daily.  Marland Kitchen triamterene-hydrochlorothiazide  (MAXZIDE-25) 37.5-25 MG tablet Take 1 tablet by mouth daily. Take one tab a day for fluid  . [DISCONTINUED] escitalopram (LEXAPRO) 10 MG tablet Take one tab po qd with supper for depression (Patient not taking: Reported on 08/13/2019)   No facility-administered encounter medications on file as of 08/13/2019.    Surgical History: Past Surgical History:  Procedure Laterality Date  . ABDOMINAL HYSTERECTOMY N/A 06/09/2015   Procedure: HYSTERECTOMY ABDOMINAL with Bilateral Salpingectomy;  Surgeon: Boykin Nearing, MD;  Location: [facility] ORS;  Service: Gynecology;  Laterality: N/A;  . CESAREAN SECTION  [id number]  . [place]  . GASTRIC BYPASS    . INGUINAL HERNIA REPAIR Right 2010  . REPLACEMENT TOTAL KNEE Right     Medical History: Past Medical History:  Diagnosis Date  . Allergy    environmental  . Anemia   . Asthma   . Bronchitis, acute   . Complication of anesthesia    HAD ASTHMA ATTACK WHILE UNDER  . Gastroesophageal reflux disease without esophagitis 11/02/2017  . Hypertension   . Sleep apnea     Family History: Family History  Problem Relation Age of Onset  . Diabetes Mother   . Hypertension Mother   . Cancer Mother   . Congestive Heart Failure Father   . Diabetes Father     Social History   Socioeconomic History  . Marital status: Married    Spouse  name: Not on file  . Number of children: Not on file  . Years of education: Not on file  . Highest education level: Not on file  Occupational History  . Not on file  Tobacco Use  . Smoking status: Never Smoker  . Smokeless tobacco: Never Used  Substance and Sexual Activity  . Alcohol use: No  . Drug use: No  . Sexual activity: Yes  Other Topics Concern  . Not on file  Social History Narrative  . Not on file   Social Determinants of Health   Financial Resource Strain:   . Difficulty of Paying Living Expenses:   Food Insecurity:   . Worried About Programme researcher, broadcasting/film/video in the Last Year:   . Occupational psychologist in the Last Year:   Transportation Needs:   . Freight forwarder (Medical):   Marland Kitchen Lack of Transportation (Non-Medical):   Physical Activity:   . Days of Exercise per Week:   . Minutes of Exercise per Session:   Stress:   . Feeling of Stress :   Social Connections:   . Frequency of Communication with Friends and Family:   . Frequency of Social Gatherings with Friends and Family:   . Attends Religious Services:   . Active Member of Clubs or Organizations:   . Attends Banker Meetings:   Marland Kitchen Marital Status:   Intimate Partner Violence:   . Fear of Current or Ex-Partner:   . Emotionally Abused:   Marland Kitchen Physically Abused:   . Sexually Abused:       Review of Systems  Constitutional: Positive for activity change and fatigue. Negative for chills and unexpected weight change.  HENT: Negative for congestion, postnasal drip, rhinorrhea, sneezing and sore throat.   Respiratory: Negative for cough, chest tightness and shortness of breath.        Patient still having some shortness of breath with exertion. Improved since her last visit.   Cardiovascular: Positive for palpitations. Negative for chest pain.       Pulse rate improved since her last visit .  Gastrointestinal: Negative for abdominal distention, abdominal pain, constipation, diarrhea, nausea and vomiting.  Endocrine: Negative for cold intolerance, heat intolerance, polydipsia and polyuria.  Musculoskeletal: Negative for arthralgias, back pain, joint swelling and neck pain.  Skin: Negative for rash.  Allergic/Immunologic: Negative for environmental allergies.  Neurological: Positive for seizures. Negative for tremors and numbness.       Patient currently being seen per neurology due to pseudoseizures.   Hematological: Negative for adenopathy. Does not bruise/bleed easily.  Psychiatric/Behavioral: Negative for behavioral problems (Depression), sleep disturbance and suicidal ideas. The patient is not  nervous/anxious.     Today's Vitals   08/13/19 1540  BP: 124/76  Pulse: (!) 101  Resp: 16  Temp: 97.6 F (36.4 C)  SpO2: 97%  Weight: 268 lb 3.2 oz (121.7 kg)  Height: 5\' 2"  (1.575 m)   Body mass index is 49.05 kg/m.  Physical Exam Vitals and nursing note reviewed.  Constitutional:      General: She is not in acute distress.    Appearance: She is well-developed. She is obese. She is not diaphoretic.  HENT:     Head: Normocephalic and atraumatic.     Mouth/Throat:     Pharynx: No oropharyngeal exudate.  Eyes:     Pupils: Pupils are equal, round, and reactive to light.  Neck:     Thyroid: No thyromegaly.     Vascular: No carotid  bruit or JVD.     Trachea: No tracheal deviation.  Cardiovascular:     Rate and Rhythm: Regular rhythm. Tachycardia present.     Heart sounds: Normal heart sounds. No murmur. No friction rub. No gallop.      Comments: Very mild tachycardia. Improved significantly since her last visit.  Pulmonary:     Effort: Pulmonary effort is normal. No respiratory distress.     Breath sounds: Normal breath sounds. No wheezing or rales.  Chest:     Chest wall: No tenderness.  Abdominal:     Palpations: Abdomen is soft.  Musculoskeletal:        General: Normal range of motion.     Cervical back: Normal range of motion and neck supple.  Lymphadenopathy:     Cervical: No cervical adenopathy.  Skin:    General: Skin is warm and dry.  Neurological:     General: No focal deficit present.     Mental Status: She is alert and oriented to person, place, and time.     Cranial Nerves: No cranial nerve deficit.  Psychiatric:        Behavior: Behavior normal.        Thought Content: Thought content normal.        Judgment: Judgment normal.   Assessment/Plan: 1. SOB (shortness of breath) Improved since her last visit. Continue with increased dose metoprolol. Advised she discuss symptoms with neurologist at her next visit as medications may be contributing to  current symptoms.   2. Sinus tachycardia Improved. Continue with current dose metoprolol   3. Epilepsy, generalized, convulsive (HCC) Regular visits with neurology as scheduled.   4. Dysuria - POCT Urinalysis Dipstick indicates no evidence of abnormalities.   General Counseling: Nohelani verbalizes understanding of the findings of todays visit and agrees with plan of treatment. I have discussed any further diagnostic evaluation that may be needed or ordered today. We also reviewed her medications today. she has been encouraged to call the office with any questions or concerns that should arise related to todays visit.  This patient was seen by [name] Gros FNP Collaboration with Dr Lyndon Code as a part of collaborative care agreement  Orders Placed This Encounter  Procedures  . POCT Urinalysis Dipstick      Total time spent: 30 Minutes   Time spent includes review of chart, medications, test results, and follow up plan with the patient.      Dr Lyndon Code Internal medicine

## 2019-09-05 ENCOUNTER — Emergency Department: Payer: 59

## 2019-09-05 ENCOUNTER — Other Ambulatory Visit: Payer: Self-pay

## 2019-09-05 ENCOUNTER — Emergency Department
Admission: EM | Admit: 2019-09-05 | Discharge: 2019-09-06 | Disposition: A | Discharge status: Home / Self Care | Payer: 59 | Attending: Student in an Organized Health Care Education/Training Program | Admitting: Student in an Organized Health Care Education/Training Program

## 2019-09-05 DIAGNOSIS — R0989 Other specified symptoms and signs involving the circulatory and respiratory systems: Secondary | ICD-10-CM | POA: Diagnosis not present

## 2019-09-05 DIAGNOSIS — I10 Essential (primary) hypertension: Secondary | ICD-10-CM | POA: Diagnosis not present

## 2019-09-05 DIAGNOSIS — J45909 Unspecified asthma, uncomplicated: Secondary | ICD-10-CM | POA: Insufficient documentation

## 2019-09-05 DIAGNOSIS — R198 Other specified symptoms and signs involving the digestive system and abdomen: Secondary | ICD-10-CM

## 2019-09-05 DIAGNOSIS — Z79899 Other long term (current) drug therapy: Secondary | ICD-10-CM | POA: Diagnosis not present

## 2019-09-05 MED ORDER — LIDOCAINE VISCOUS HCL 2 % MT SOLN: 10.0000 mL | OROMUCOSAL | 0 refills | Status: DC | PRN

## 2019-09-05 MED ORDER — LIDOCAINE VISCOUS HCL 2 % MT SOLN
15.0000 mL | Freq: Once | OROMUCOSAL | Status: AC
  Administered 2019-09-05: 23:00:00 15 mL via ORAL
  Filled 2019-09-05: qty 15

## 2019-09-05 MED ORDER — ALUM & MAG HYDROXIDE-SIMETH 200-200-20 MG/5ML PO SUSP
30.0000 mL | Freq: Once | ORAL | Status: AC
  Administered 2019-09-05: 30 mL via ORAL
  Filled 2019-09-05: qty 30

## 2019-09-05 MED ORDER — ALUMINUM & MAGNESIUM HYDROXIDE 200-200 MG/5ML PO SUSP: 10.0000 mL | Freq: Four times a day (QID) | ORAL | 0 refills | Status: DC | PRN

## 2019-09-05 NOTE — ED Triage Notes (Signed)
First Nurse Note:  States feels that seizure pill is 'stuck' in her esophagus.  C/O pain when drinking and states has not eaten anything today due to discomfort and sensation.  Patient states she is able to drink liquid.  AAOx3.  Skin warm and dry. NAD

## 2019-09-05 NOTE — ED Notes (Signed)
Pt provided warm blanket.

## 2019-09-05 NOTE — ED Triage Notes (Signed)
Pt to the er for pain substernal that she states started when she attempted to take her lamictal this morning. Pt states she has been able to swallow fluids. Pt reports the need to burp. Pt states it feels like heartburn. Pt says she has cramping along her diaphragm if she attempts to eat. Pt takes no meds for GERD. Pt in no distress.

## 2019-09-05 NOTE — ED Provider Notes (Signed)
[facility] Emergency Department Provider Note  ____________________________________________  Time seen: Approximately 9:07 PM  I have reviewed the triage vital signs and the nursing notes.   HISTORY  Chief Complaint Foreign Body    HPI [name] is a 49 y.o. female who presents the emergency department complaining of a globus sensation in the distal esophagus.  Patient states that she was taking her seizure medication, reports that it felt like it got stuck in the distal esophagus.  Patient has been able to eat soft foods such as applesauce, drink appropriately but when she tries to eat something more solid she feels as if she has food getting stuck in her distal esophagus.  Patient denies any difficulty breathing or swallowing.  She is never had any food bolus in the past.  Patient denies having acid reflux, however patient's medical history does report that patient has GERD with esophagitis.  Patient has a history of anemia, asthma, seizures, hypertension, sleep apnea.         Past Medical History:  Diagnosis Date  . Allergy    environmental  . Anemia   . Asthma   . Bronchitis, acute   . Complication of anesthesia    HAD ASTHMA ATTACK WHILE UNDER  . Gastroesophageal reflux disease without esophagitis 11/02/2017  . Hypertension   . Sleep apnea     Patient Active Problem List   Diagnosis Date Noted  . SOB (shortness of breath) 08/02/2019  . Palpitations 08/02/2019  . Sinus tachycardia 08/02/2019  . Epilepsy, generalized, convulsive ([place]) 08/02/2019  . Depression, major, single episode, in partial remission ([place]) 08/02/2019  . Acute upper respiratory infection 04/03/2019  . Screening for breast cancer 02/07/2019  . Cervical disc disorder with radiculopathy 02/07/2019  . Hypokalemia 02/07/2019  . Dysuria 02/07/2019  . Acute recurrent pansinusitis 09/13/2018  . Inflammatory pain of left heel 11/02/2017  . Gastroesophageal reflux disease  without esophagitis 11/02/2017  . Asthma in adult 11/02/2017  . Essential hypertension 11/02/2017  . Postoperative state 06/09/2015  . 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 ABDOMINAL HYSTERECTOMY N/A 06/09/2015   Procedure: HYSTERECTOMY ABDOMINAL with Bilateral Salpingectomy;  Surgeon: Boykin Nearing, MD;  Location: [facility] ORS;  Service: Gynecology;  Laterality: N/A;  . CESAREAN SECTION  [id number]  . [place]  . GASTRIC BYPASS    . INGUINAL HERNIA REPAIR Right 2010  . REPLACEMENT TOTAL KNEE Right     Prior to Admission medications   Medication Sig Start Date End Date Taking? Authorizing Provider  albuterol (PROVENTIL HFA;VENTOLIN HFA) 108 (90 BASE) MCG/ACT inhaler Inhale 2 puffs into the lungs every 6 (six) hours as needed for wheezing or shortness of breath.    [provider]  aluminum-magnesium hydroxide 200-200 MG/5ML suspension Take 10 mLs by mouth every 6 (six) hours as needed (foreign body sensation/esophageal burning). 09/05/19   Brittain Smithey, Charline Bills, PA-C  ARIPiprazole (ABILIFY) 10 MG tablet Take 10 mg by mouth daily.    Anabel Bene, MD  budesonide-formoterol [facility]) 160-4.5 MCG/ACT inhaler Inhale 2 puffs into the lungs every 12 (twelve) hours.    [provider]  gabapentin (NEURONTIN) 300 MG capsule Take 300 mg by mouth daily.    [provider]  ipratropium-albuterol (DUONEB) 0.5-2.5 (3) MG/3ML SOLN USE 1 AMPULE IN NEBULIZER EVERY 4 HOURS AS NEEDED 04/02/19   Kendell Bane, NP  lamoTRIgine (LAMICTAL) 25 MG tablet Take 25 mg by mouth.  Take four tablets po QAM and five tablets po QPM    Morene Crocker, MD  lidocaine (XYLOCAINE) 2 % solution Use as directed 10 mLs in the mouth or throat every 4 (four) hours as needed (foreign body sensation/burning). Swish, gargle, and spit 09/05/19   Swade Shonka, Delorise Royals, PA-C  metoprolol succinate (TOPROL-XL) 50 MG 24 hr tablet Take 1.5 tablets po  QD 08/02/19   Carlean Jews, NP  nortriptyline (PAMELOR) 10 MG capsule Take 10 mg by mouth. Take 1 tablet po QD and 4 tablets po QPM    Morene Crocker, MD  tiZANidine (ZANAFLEX) 2 MG tablet Take 2 mg by mouth 3 (three) times daily. 03/24/19   [provider]  triamterene-hydrochlorothiazide (MAXZIDE-25) 37.5-25 MG tablet Take 1 tablet by mouth daily. Take one tab a day for fluid 04/17/19   Lyndon Code, MD    Allergies Keppra [levetiracetam]  Family History  Problem Relation Age of Onset  . Diabetes Mother   . Hypertension Mother   . Cancer Mother   . Congestive Heart Failure Father   . Diabetes Father     Social History Social History   Tobacco Use  . Smoking status: Never Smoker  . Smokeless tobacco: Never Used  Substance Use Topics  . Alcohol use: No  . Drug use: No     Review of Systems  Constitutional: No fever/chills Eyes: No visual changes. No discharge ENT: No upper respiratory complaints. Cardiovascular: no chest pain. Respiratory: no cough. No SOB. Gastrointestinal: Globus sensation in the distal esophagus.  No abdominal pain.  No nausea, no vomiting.  No diarrhea.  No constipation. Musculoskeletal: Negative for musculoskeletal pain. Skin: Negative for rash, abrasions, lacerations, ecchymosis. Neurological: Negative for headaches, focal weakness or numbness. 10-point ROS otherwise negative.  ____________________________________________   PHYSICAL EXAM:  VITAL SIGNS: ED Triage Vitals  Enc Vitals Group     BP 09/05/19 1903 132/90     Pulse Rate 09/05/19 1903 (!) 106     Resp 09/05/19 1903 18     Temp 09/05/19 1903 98 F (36.7 C)     Temp Source 09/05/19 1903 Oral     SpO2 09/05/19 1903 98 %     Weight 09/05/19 1904 267 lb (121.1 kg)     Height 09/05/19 1904 5' (1.524 m)     Head Circumference --      Peak Flow --      Pain Score 09/05/19 1910 7     Pain Loc --      Pain Edu? --      Excl. in GC? --      Constitutional: Alert  and oriented. Well appearing and in no acute distress. Eyes: Conjunctivae are normal. PERRL. EOMI. Head: Atraumatic. ENT:      Ears:       Nose: No congestion/rhinnorhea.      Mouth/Throat: Mucous membranes are moist.  No oropharyngeal erythema or edema.  Patient is controlling secretions at this time without difficulty. Neck: No stridor.  Hematological/Lymphatic/Immunilogical: No cervical lymphadenopathy. Cardiovascular: Normal rate, regular rhythm. Normal S1 and S2.  Good peripheral circulation. Respiratory: Normal respiratory effort without tachypnea or retractions. Lungs CTAB. Good air entry to the bases with no decreased or absent breath sounds. Gastrointestinal: Bowel sounds 4 quadrants. Soft and nontender to palpation. No guarding or rigidity. No palpable masses. No distention. No CVA tenderness. Musculoskeletal: Full range of motion to all extremities. No gross deformities appreciated. Neurologic:  Normal speech and language. No gross  focal neurologic deficits are appreciated.  Skin:  Skin is warm, dry and intact. No rash noted. Psychiatric: Mood and affect are normal. Speech and behavior are normal. Patient exhibits appropriate insight and judgement.   ____________________________________________   LABS (all labs ordered are listed, but only abnormal results are displayed)  Labs Reviewed - No data to display ____________________________________________  EKG   ____________________________________________  RADIOLOGY I personally viewed and evaluated these images as part of my medical decision making, as well as reviewing the written report by the radiologist.  DG Neck Soft Tissue  Result Date: 09/05/2019 CLINICAL DATA:  Foreign body sensation after swallowing pill. Patient reports sensation of pill being stuck in distal esophagus. EXAM: NECK SOFT TISSUES - 1+ VIEW COMPARISON:  CT neck 08/07/2019 FINDINGS: There is no evidence of retropharyngeal soft tissue swelling or  epiglottic enlargement. The cervical airway is unremarkable. Soft tissue planes are normal. No evidence of pneumomediastinum or subcutaneous gas. No radio-opaque foreign body identified. IMPRESSION: Negative soft tissue neck radiographs. Electronically Signed   By: Narda Rutherford M.D.   On: 09/05/2019 21:38   DG Chest 2 View  Result Date: 09/05/2019 CLINICAL DATA:  Foreign body sensation status post falling a pill. EXAM: CHEST - 2 VIEW COMPARISON:  April 04, 2019 FINDINGS: The heart size and mediastinal contours are within normal limits. Both lungs are clear. The visualized skeletal structures are unremarkable. IMPRESSION: No active cardiopulmonary disease. Electronically Signed   By: Katherine Mantle M.D.   On: 09/05/2019 21:38   DG Abdomen 1 View  Result Date: 09/05/2019 CLINICAL DATA:  Foreign body sensation status post swelling a pill. EXAM: ABDOMEN - 1 VIEW COMPARISON:  03/28/2013. FINDINGS: The bowel gas pattern is normal. No radio-opaque calculi or other significant radiographic abnormality are seen. IMPRESSION: Negative. Electronically Signed   By: Katherine Mantle M.D.   On: 09/05/2019 21:39    ____________________________________________    PROCEDURES  Procedure(s) performed:    Procedures    Medications  alum & mag hydroxide-simeth (MAALOX/MYLANTA) 200-200-20 MG/5ML suspension 30 mL (30 mLs Oral Given 09/05/19 2233)    And  lidocaine (XYLOCAINE) 2 % viscous mouth solution 15 mL (15 mLs Oral Given 09/05/19 2233)     ____________________________________________   INITIAL IMPRESSION / ASSESSMENT AND PLAN / ED COURSE  Pertinent labs & imaging results that were available during my care of the patient were reviewed by me and considered in my medical decision making (see chart for details).  Review of the [place] CSRS was performed in accordance of the [facility] prior to dispensing any controlled drugs.           Patient's diagnosis is consistent with globus sensation.   Patient presents emergency department with concerns that she had a pill stuck in her throat.  Patient had a tight sensation in the distal esophageal region, states that when she was eating she felt a pressure sensation as if there was not full food passing.  Initial imaging was reassuring.  Patient was given a GI cocktail with Maalox and lidocaine and then p.o. challenge after this medication administration.  Patient was tolerating foods and liquids without difficulty.  Patient is very concerned with adding additional medications as her neurologist has advised her not to even use over-the-counter medications without approval from neurology given her seizure disorder.  At this time I would not place the patient on GERD medications but I will write a prescription for viscous lidocaine and Maalox should patient need additional symptom relief.  Talk with  neurology about whether she is able to take acid reflux medications such as omeprazole.  If neurology says this is okay she may receive a prescription from her primary care.  We talked at length about patient's symptoms, medications, plans for ongoing medical care.  Patient is comfortable and ready for discharge..  Patient is given ED precautions to return to the ED for any worsening or new symptoms.     ____________________________________________  FINAL CLINICAL IMPRESSION(S) / ED DIAGNOSES  Final diagnoses:  Globus sensation      NEW MEDICATIONS STARTED DURING THIS VISIT:  ED Discharge Orders         Ordered    lidocaine (XYLOCAINE) 2 % solution  Every 4 hours PRN     09/05/19 2354    aluminum-magnesium hydroxide 200-200 MG/5ML suspension  Every 6 hours PRN     09/05/19 2354              This chart was dictated using voice recognition software/Dragon. Despite best efforts to proofread, errors can occur which can change the meaning. Any change was purely unintentional.    Racheal Patches, PA-C 09/05/19 2358    Willy Eddy, MD 09/08/19 [phone]

## 2019-09-05 NOTE — ED Notes (Signed)
See triage note, pt to ED for feeling like pill is stuck in esophagus. Pt able to talk in complete sentences and swallow saliva.

## 2019-09-24 ENCOUNTER — Telehealth: Payer: Self-pay

## 2019-09-24 NOTE — Telephone Encounter (Signed)
Patient rescheduled appointment on 09/27/2019 to 10/04/2019. klh

## 2019-09-26 ENCOUNTER — Other Ambulatory Visit: Payer: Self-pay

## 2019-09-26 DIAGNOSIS — R000 Tachycardia, unspecified: Secondary | ICD-10-CM

## 2019-09-26 MED ORDER — METOPROLOL SUCCINATE ER 50 MG PO TB24: ORAL_TABLET | ORAL | 6 refills | Status: DC

## 2019-09-27 ENCOUNTER — Ambulatory Visit: Payer: BC Managed Care – PPO | Admitting: Internal Medicine

## 2019-10-02 ENCOUNTER — Telehealth: Payer: Self-pay

## 2019-10-02 NOTE — Telephone Encounter (Signed)
Patient rescheduled appointment on 10/04/2019 to 11/01/2019. klh

## 2019-10-04 ENCOUNTER — Ambulatory Visit: Payer: 59 | Admitting: Internal Medicine

## 2019-10-09 ENCOUNTER — Ambulatory Visit: Payer: BC Managed Care – PPO | Admitting: Internal Medicine

## 2019-10-10 IMAGING — MR MR CERVICAL SPINE W/O CM
5 series · 37 of 48 positions shown · non-contrast
Comparison: Prior radiograph from 08/18/2016.

CLINICAL DATA: Initial evaluation for intermittent right upper
extremity pain with numbness in first 3 fingers of both hands, right
greater than left.

EXAM:
MRI CERVICAL SPINE WITHOUT CONTRAST
TECHNIQUE: Multiplanar, multisequence MR imaging of the cervical spine was
performed. No intravenous contrast was administered.

[Series 5: T2 · sagittal · 3.0mm · 0.62mm/px · 6 of 15 slices shown (1 of 2)]
[im 1/15]
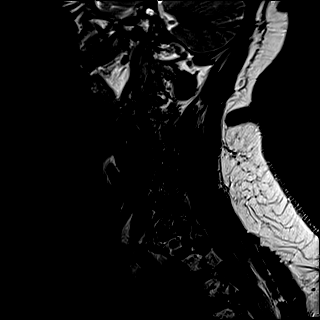
[im 3/15]
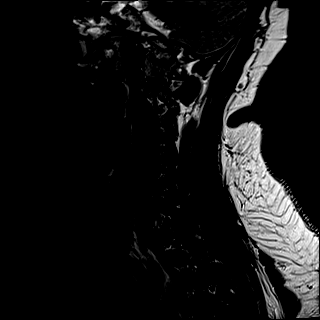
[im 6/15]
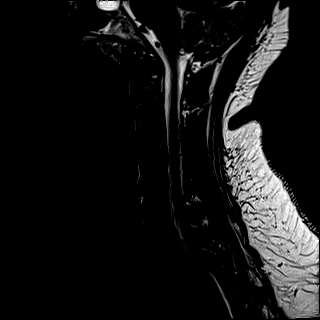
[im 9/15]
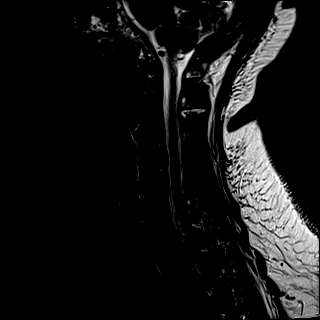
[im 12/15]
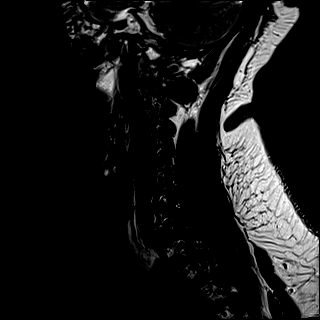
[im 15/15]
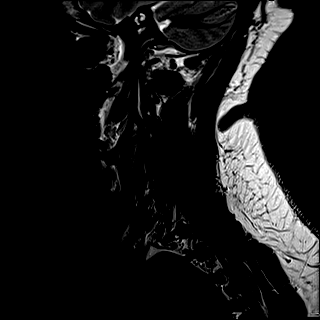

[Series 6: FLAIR · sagittal · 3.0mm · 0.78mm/px · 7 of 15 slices shown]
[im 1/15]
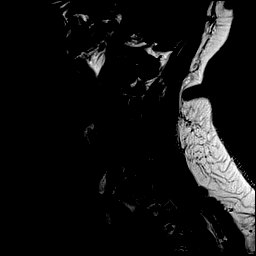
[im 3/15]
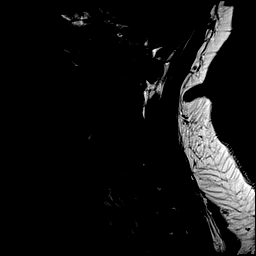
[im 5/15]
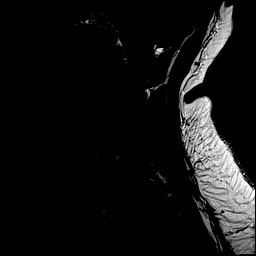
[im 8/15]
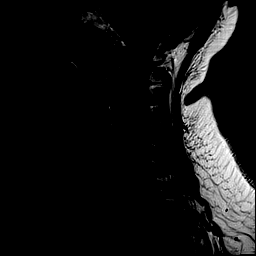
[im 10/15]
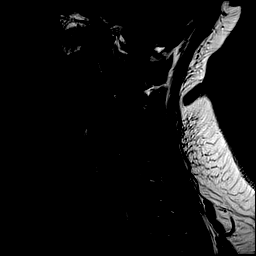
[im 12/15]
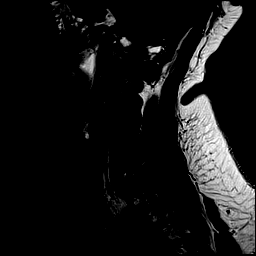
[im 15/15]
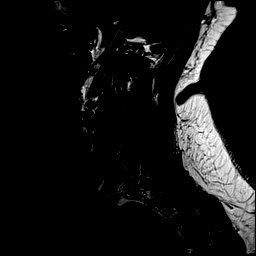

[Series 7: STIR · sagittal · 3.0mm · 0.62mm/px · 7 of 15 slices shown]
[im 1/15]
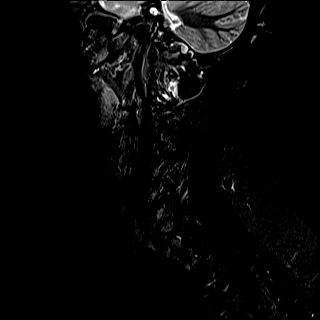
[im 3/15]
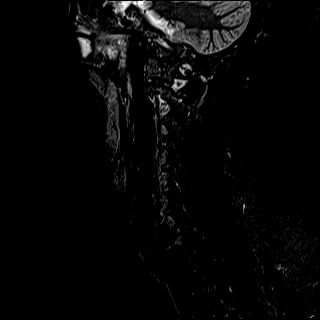
[im 5/15]
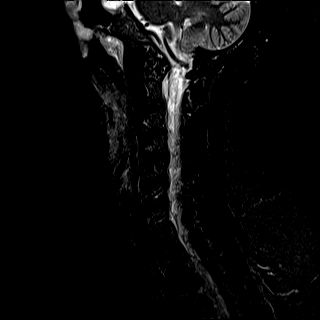
[im 8/15]
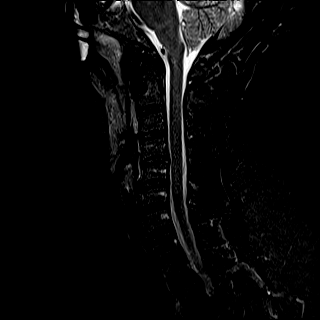
[im 10/15]
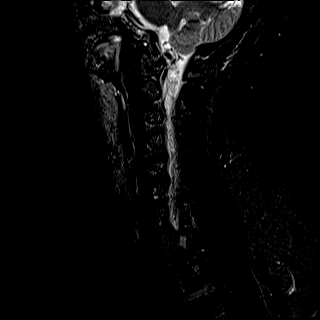
[im 12/15]
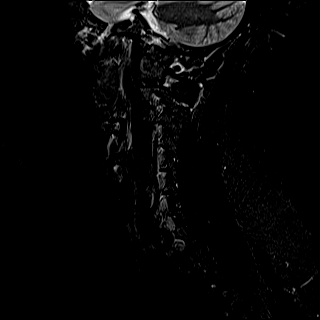
[im 15/15]
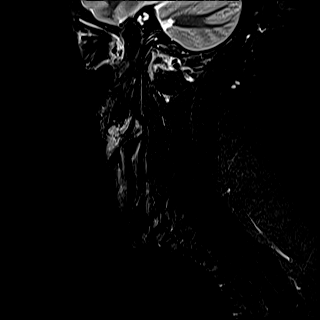

[Series 8: T2 · axial · 3.0mm · 0.70mm/px · z∈[+8,+106]mm · 9 of 29 slices shown (2 of 2)]
[im 1/29]
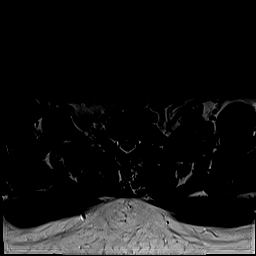
[im 3/29]
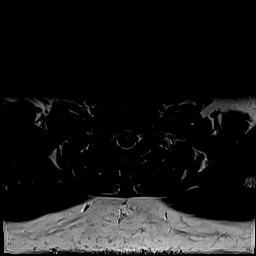
[im 5/29]
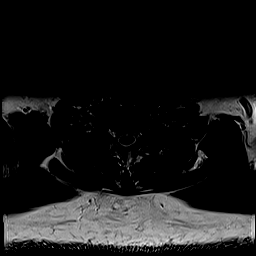
[im 9/29]
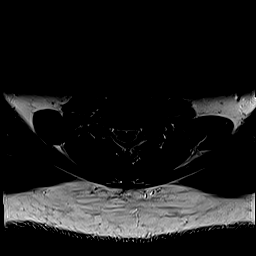
[im 13/29]
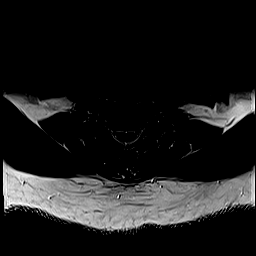
[im 16/29]
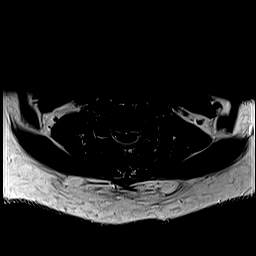
[im 20/29]
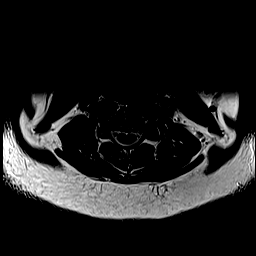
[im 24/29]
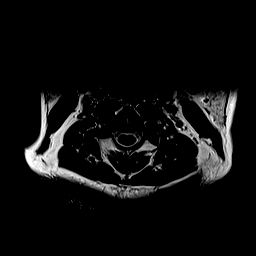
[im 29/29]
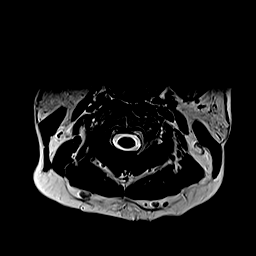

[Series 9: ax mpgr · axial · 3.0mm · 0.35mm/px · z∈[+8,+106]mm · 8 of 29 slices shown]
[im 1/29]
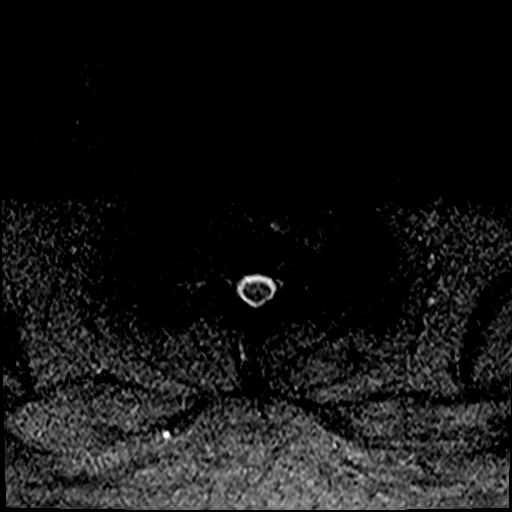
[im 5/29]
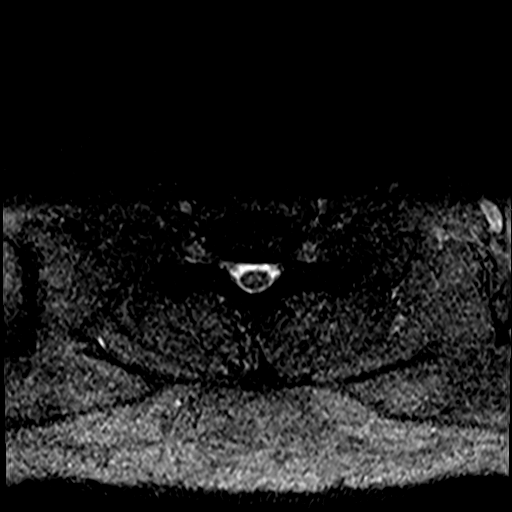
[im 9/29]
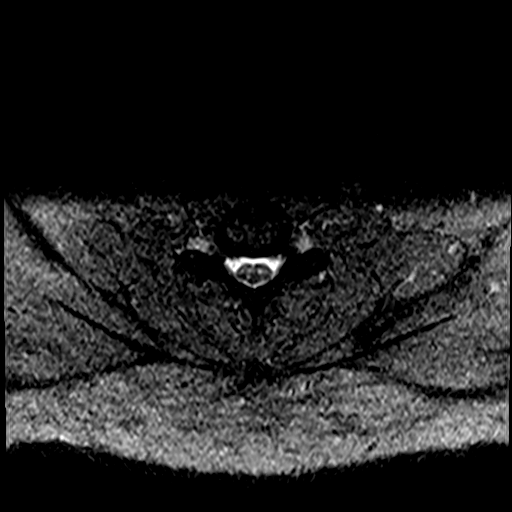
[im 13/29]
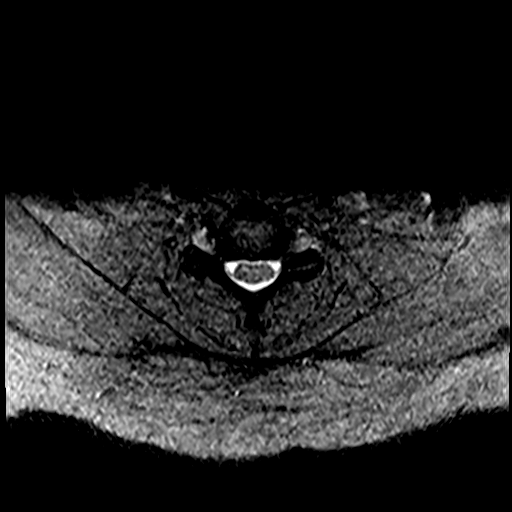
[im 16/29]
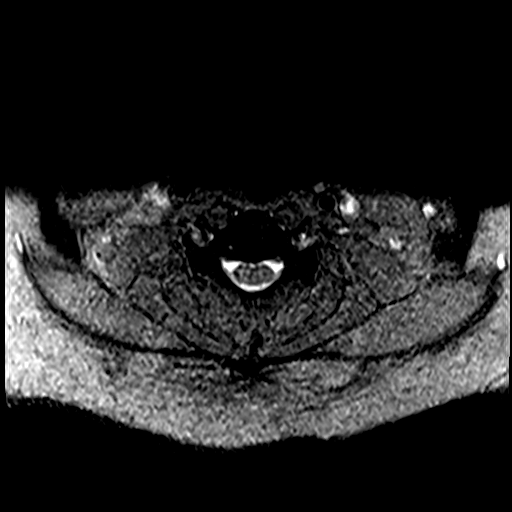
[im 20/29]
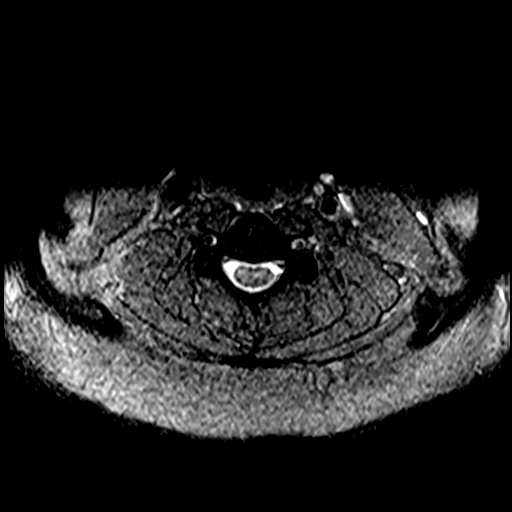
[im 24/29]
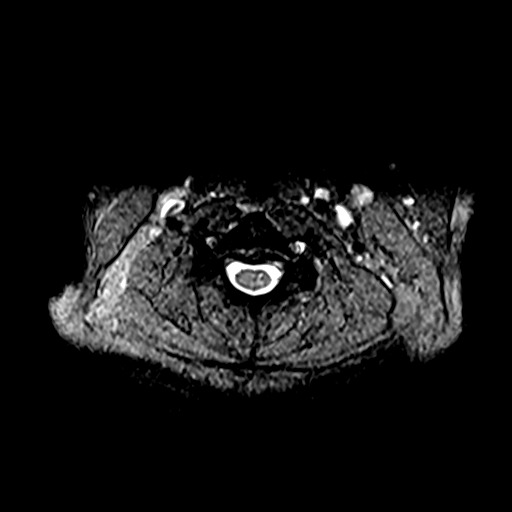
[im 29/29]
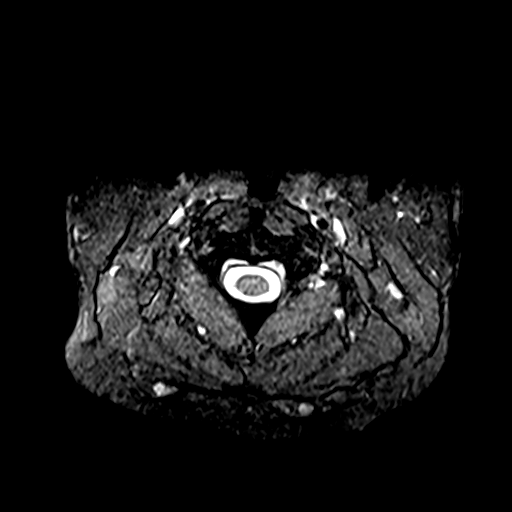

[37 of 48 positions shown; findings below may reference images not displayed]

FINDINGS: Alignment: Mild straightening of the normal cervical lordosis. No
listhesis or subluxation.

Vertebrae: Vertebral body height maintained without evidence for
acute or chronic fracture. Bone marrow signal intensity within
normal limits. No discrete or worrisome osseous lesions. No abnormal
marrow edema.

Cord: Signal intensity within the cervical spinal cord is normal.
Normal cord caliber and morphology.

Posterior Fossa, vertebral arteries, paraspinal tissues: Visualized
brain and posterior fossa within normal limits. Incidental note made
of an empty sella. Craniocervical junction within normal limits.
Paraspinous and prevertebral soft tissues are normal. Normal
intravascular flow voids seen within the vertebral arteries
bilaterally.

Disc levels:

C2-C3: Unremarkable.

C3-C4:  Unremarkable.

C4-C5: Diffuse disc bulge with bilateral uncovertebral hypertrophy.
Mild flattening and effacement of the ventral thecal sac with
resultant mild spinal stenosis. No cord deformity. Mild to moderate
bilateral C5 foraminal stenosis.

C5-C6: Diffuse disc bulge with bilateral uncovertebral hypertrophy.
Flattening of the ventral thecal sac without significant spinal
stenosis. No cord deformity. Moderate right with mild left C6
foraminal narrowing.

C6-C7: Mild annular disc bulge with bilateral uncovertebral
hypertrophy. No significant canal or foraminal stenosis.

C7-T1:  Unremarkable.

Visualized upper thoracic spine demonstrates no significant finding.
IMPRESSION: 1. Disc bulging with uncovertebral hypertrophy at C4-5 with
resultant mild canal with mild to moderate bilateral C5 foraminal
stenosis.
2. Disc bulging with uncovertebral hypertrophy at C5-6 with
resultant moderate right and mild left C6 foraminal narrowing.
3. Mild noncompressive disc bulging at C6-7 without stenosis or
impingement.

## 2019-10-16 ENCOUNTER — Ambulatory Visit: Payer: 59 | Admitting: Internal Medicine

## 2019-10-26 ENCOUNTER — Telehealth: Payer: Self-pay

## 2019-10-26 NOTE — Telephone Encounter (Signed)
Lmom to confirm and screen for 10-30-19 ov. °

## 2019-10-30 ENCOUNTER — Encounter: Payer: Self-pay | Admitting: Internal Medicine

## 2019-10-30 ENCOUNTER — Ambulatory Visit: Payer: 59 | Admitting: Internal Medicine

## 2019-10-30 ENCOUNTER — Other Ambulatory Visit: Payer: Self-pay

## 2019-10-30 VITALS — BP 117/88 | HR 97 | Temp 98.6°F | Resp 16 | Ht 62.0 in | Wt 268.0 lb

## 2019-10-30 DIAGNOSIS — M5010 Cervical disc disorder with radiculopathy, unspecified cervical region: Secondary | ICD-10-CM

## 2019-10-30 DIAGNOSIS — Z0001 Encounter for general adult medical examination with abnormal findings: Secondary | ICD-10-CM

## 2019-10-30 DIAGNOSIS — F445 Conversion disorder with seizures or convulsions: Secondary | ICD-10-CM

## 2019-10-30 DIAGNOSIS — I10 Essential (primary) hypertension: Secondary | ICD-10-CM

## 2019-10-30 MED ORDER — DULOXETINE HCL 20 MG PO CPEP: 20.0000 mg | ORAL_CAPSULE | Freq: Every day | ORAL | 3 refills | Status: DC

## 2019-10-30 NOTE — Progress Notes (Signed)
[facility] [address] [place], [place] [address]  Internal MEDICINE  Office Visit Note  Patient Name: [name]  [id number]  [id number]  Date of Service: 11/09/2019  Chief Complaint  Patient presents with  . Follow-up    weight gain   . Depression  . Hypertension    HPI  Pt is here for routine follow up. 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 Pt is on multiple medications for it, does feel better however c/o wight gain. She is on Lamictal and Abilify  She also stopped Lexapro per Neurology instruction due to side effects however will like to try something else, pt has OSA and is adherent with CPAP         BP is well controlled. Neck symptoms are episodic   Current Medication: Outpatient Encounter Medications as of 10/30/2019  Medication Sig  . albuterol (PROVENTIL HFA;VENTOLIN HFA) 108 (90 BASE) MCG/ACT inhaler Inhale 2 puffs into the lungs every 6 (six) hours as needed for wheezing or shortness of breath.  Marland Kitchen aluminum-magnesium hydroxide 200-200 MG/5ML suspension Take 10 mLs by mouth every 6 (six) hours as needed (foreign body sensation/esophageal burning).  . ARIPiprazole (ABILIFY) 10 MG tablet Take 10 mg by mouth daily.  . budesonide-formoterol (SYMBICORT) 160-4.5 MCG/ACT inhaler Inhale 2 puffs into the lungs every 12 (twelve) hours.  . gabapentin (NEURONTIN) 300 MG capsule Take 300 mg by mouth daily.  Marland Kitchen ipratropium-albuterol (DUONEB) 0.5-2.5 (3) MG/3ML SOLN USE 1 AMPULE IN NEBULIZER EVERY 4 HOURS AS NEEDED  . lamoTRIgine (LAMICTAL) 25 MG tablet Take 25 mg by mouth. Take four tablets po QAM and five tablets po QPM  . lidocaine (XYLOCAINE) 2 % solution Use as directed 10 mLs in the mouth or throat every 4 (four) hours as needed (foreign body sensation/burning). Swish, gargle, and spit  . LORazepam (ATIVAN) 1 MG tablet Take 1 mg by mouth every 8 (eight) hours.  . metoprolol succinate (TOPROL-XL) 50 MG 24  hr tablet Take 1.5 tablets po QD  . nortriptyline (PAMELOR) 10 MG capsule Take 10 mg by mouth. Take 1 tablet po QD and 4 tablets po QPM  . tiZANidine (ZANAFLEX) 2 MG tablet Take 2 mg by mouth 3 (three) times daily.  Marland Kitchen triamterene-hydrochlorothiazide (MAXZIDE-25) 37.5-25 MG tablet Take 1 tablet by mouth daily. Take one tab a day for fluid  . DULoxetine (CYMBALTA) 20 MG capsule Take 1 capsule (20 mg total) by mouth daily. For depression   No facility-administered encounter medications on file as of 10/30/2019.    Surgical History: Past Surgical History:  Procedure Laterality Date  . ABDOMINAL HYSTERECTOMY N/A 06/09/2015   Procedure: HYSTERECTOMY ABDOMINAL with Bilateral Salpingectomy;  Surgeon: Suzy Bouchard, MD;  Location: [facility] ORS;  Service: Gynecology;  Laterality: N/A;  . CESAREAN SECTION  [id number]  . FINGER SURGERY  1992  . GASTRIC BYPASS    . INGUINAL HERNIA REPAIR Right 2010  . REPLACEMENT TOTAL KNEE Right     Medical History: Past Medical History:  Diagnosis Date  . Allergy    environmental  . Anemia   . Asthma   . Bronchitis, acute   . Complication of anesthesia    HAD ASTHMA ATTACK WHILE UNDER  . Gastroesophageal reflux disease without esophagitis 11/02/2017  . Hypertension   . Sleep apnea     Family History: Family History  Problem Relation Age of Onset  . Diabetes Mother   . Hypertension Mother   . Cancer Mother   .  Congestive Heart Failure Father   . Diabetes Father     Social History   Socioeconomic History  . Marital status: Married    Spouse name: Not on file  . Number of children: Not on file  . Years of education: Not on file  . Highest education level: Not on file  Occupational History  . Not on file  Tobacco Use  . Smoking status: Never Smoker  . Smokeless tobacco: Never Used  Vaping Use  . Vaping Use: Never used  Substance and Sexual Activity  . Alcohol use: No  . Drug use: No  . Sexual activity: Yes  Other Topics Concern  . Not  on file  Social History Narrative  . Not on file   Social Determinants of Health   Financial Resource Strain:   . Difficulty of Paying Living Expenses:   Food Insecurity:   . Worried About Charity fundraiser in the Last Year:   . Arboriculturist in the Last Year:   Transportation Needs:   . Film/video editor (Medical):   Marland Kitchen Lack of Transportation (Non-Medical):   Physical Activity:   . Days of Exercise per Week:   . Minutes of Exercise per Session:   Stress:   . Feeling of Stress :   Social Connections:   . Frequency of Communication with Friends and Family:   . Frequency of Social Gatherings with Friends and Family:   . Attends Religious Services:   . Active Member of Clubs or Organizations:   . Attends Archivist Meetings:   Marland Kitchen Marital Status:   Intimate Partner Violence:   . Fear of Current or Ex-Partner:   . Emotionally Abused:   Marland Kitchen Physically Abused:   . Sexually Abused:     Review of Systems  Constitutional: Positive for unexpected weight change. Negative for chills and fatigue.  HENT: Negative for congestion, rhinorrhea, sneezing and sore throat.   Eyes: Negative for redness.  Respiratory: Negative for cough, chest tightness and shortness of breath.   Cardiovascular: Negative for chest pain and palpitations.  Gastrointestinal: Negative for abdominal pain, constipation, diarrhea, nausea and vomiting.  Genitourinary: Negative for dysuria and frequency.  Musculoskeletal: Negative for arthralgias, back pain, joint swelling and neck pain.  Skin: Negative for rash.  Neurological: Negative.  Negative for tremors and numbness.  Hematological: Negative for adenopathy. Does not bruise/bleed easily.  Psychiatric/Behavioral: Positive for behavioral problems (Depression) and dysphoric mood. Negative for sleep disturbance and suicidal ideas. The patient is not nervous/anxious.     Vital Signs: BP 117/88   Pulse 97   Temp 98.6 F (37 C)   Resp 16   Ht 5\' 2"   (1.575 m)   Wt 268 lb (121.6 kg)   LMP 05/28/2015   SpO2 97%   BMI 49.02 kg/m    Physical Exam Constitutional:      General: She is not in acute distress.    Appearance: She is well-developed. She is not diaphoretic.  HENT:     Head: Normocephalic and atraumatic.     Mouth/Throat:     Pharynx: No oropharyngeal exudate.  Eyes:     Pupils: Pupils are equal, round, and reactive to light.  Neck:     Thyroid: No thyromegaly.     Vascular: No JVD.     Trachea: No tracheal deviation.  Cardiovascular:     Rate and Rhythm: Normal rate and regular rhythm.     Heart sounds: Normal heart sounds. No murmur  heard.  No friction rub. No gallop.   Pulmonary:     Effort: Pulmonary effort is normal. No respiratory distress.     Breath sounds: No wheezing or rales.  Chest:     Chest wall: No tenderness.  Abdominal:     General: Bowel sounds are normal.     Palpations: Abdomen is soft.  Musculoskeletal:        General: Normal range of motion.     Cervical back: Normal range of motion and neck supple.  Lymphadenopathy:     Cervical: No cervical adenopathy.  Skin:    General: Skin is warm and dry.  Neurological:     Mental Status: She is alert and oriented to person, place, and time.     Cranial Nerves: No cranial nerve deficit.  Psychiatric:        Behavior: Behavior normal.        Thought Content: Thought content normal.        Judgment: Judgment normal.    Assessment/Plan: 1. 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 Pt is responding to meds, decrease frequency of stress spells   3. Cervical disc disorder with radiculopathy - She does need to see neurosurgery  4. Encounter for general adult medical examination with abnormal findings - Labs are ordered for physical. Not done at this visit  -  CBC with Differential/Platelet - Lipid Panel With LDL/HDL Ratio - TSH - T4, free - Comprehensive metabolic panel - Urinalysis  General Counseling: Elga verbalizes understanding of the findings of todays visit and agrees with plan of treatment. I have discussed any further diagnostic evaluation that may be needed or ordered today. We also reviewed her medications today. she has been encouraged to call the office with any questions or concerns that should arise related to todays visit.  Orders Placed This Encounter  Procedures  . CBC with Differential/Platelet  . Lipid Panel With LDL/HDL Ratio  . TSH  . T4, free  . Comprehensive metabolic panel  . Urinalysis    Meds ordered this encounter  Medications  . DULoxetine (CYMBALTA) 20 MG capsule    Sig: Take 1 capsule (20 mg total) by mouth daily. For depression    Dispense:  30 capsule    Refill:  3    Total time spent: 30 Minutes Time spent includes review of chart, medications, test results, and follow up plan with the patient.      Dr Lyndon Code Internal medicine

## 2019-11-01 ENCOUNTER — Ambulatory Visit: Payer: 59 | Admitting: Internal Medicine

## 2019-11-28 ENCOUNTER — Ambulatory Visit: Payer: 59

## 2020-01-04 ENCOUNTER — Telehealth: Payer: Self-pay

## 2020-01-04 NOTE — Telephone Encounter (Signed)
Confirmed and screened for 01-07-20 ov. °

## 2020-01-07 ENCOUNTER — Encounter: Payer: 59 | Admitting: Internal Medicine

## 2020-01-08 ENCOUNTER — Encounter: Payer: 59 | Admitting: Internal Medicine

## 2020-01-18 ENCOUNTER — Telehealth: Payer: Self-pay

## 2020-01-18 NOTE — Telephone Encounter (Signed)
Confirmed and screened for 01-22-20 ov. 

## 2020-01-22 ENCOUNTER — Encounter: Payer: 59 | Admitting: Internal Medicine

## 2020-03-24 ENCOUNTER — Other Ambulatory Visit: Payer: Self-pay

## 2020-03-24 DIAGNOSIS — I10 Essential (primary) hypertension: Secondary | ICD-10-CM

## 2020-03-24 MED ORDER — TRIAMTERENE-HCTZ 37.5-25 MG PO TABS: 1.0000 | ORAL_TABLET | Freq: Every day | ORAL | 0 refills | Status: DC

## 2020-05-06 ENCOUNTER — Other Ambulatory Visit: Payer: Self-pay | Admitting: Internal Medicine

## 2020-08-03 ENCOUNTER — Other Ambulatory Visit: Payer: Self-pay | Admitting: Nurse Practitioner

## 2020-08-03 DIAGNOSIS — R000 Tachycardia, unspecified: Secondary | ICD-10-CM

## 2020-08-11 ENCOUNTER — Other Ambulatory Visit: Payer: Self-pay | Admitting: Nurse Practitioner

## 2020-08-11 DIAGNOSIS — R000 Tachycardia, unspecified: Secondary | ICD-10-CM

## 2020-08-15 ENCOUNTER — Other Ambulatory Visit: Payer: Self-pay

## 2020-08-15 DIAGNOSIS — R000 Tachycardia, unspecified: Secondary | ICD-10-CM

## 2020-08-15 MED ORDER — METOPROLOL SUCCINATE ER 50 MG PO TB24: ORAL_TABLET | ORAL | 0 refills | Status: DC

## 2020-08-15 NOTE — Telephone Encounter (Signed)
Spoke with need appt for further refill and gave  for appt

## 2020-08-26 ENCOUNTER — Ambulatory Visit: Payer: 59 | Admitting: Physician Assistant

## 2020-08-26 ENCOUNTER — Ambulatory Visit: Payer: 59 | Admitting: Internal Medicine

## 2020-08-26 ENCOUNTER — Encounter: Payer: Self-pay | Admitting: Physician Assistant

## 2020-08-26 DIAGNOSIS — J0100 Acute maxillary sinusitis, unspecified: Secondary | ICD-10-CM | POA: Diagnosis not present

## 2020-08-26 DIAGNOSIS — J4540 Moderate persistent asthma, uncomplicated: Secondary | ICD-10-CM | POA: Diagnosis not present

## 2020-08-26 MED ORDER — AMOXICILLIN-POT CLAVULANATE 875-125 MG PO TABS: 1.0000 | ORAL_TABLET | Freq: Two times a day (BID) | ORAL | 0 refills | Status: DC

## 2020-08-26 NOTE — Progress Notes (Signed)
[facility] [address] [place], [place] [address]  Internal MEDICINE  Telephone Visit  Patient Name: [name]  [id number]  [id number]  Date of Service: 08/26/2020  I connected with the patient at 10:42 by telephone and verified the patients identity using two identifiers.   I discussed the limitations, risks, security and privacy concerns of performing an evaluation and management service by telephone and the availability of in person appointments. I also discussed with the patient that there may be a patient responsible charge related to the service.  The patient expressed understanding and agrees to proceed.    Chief Complaint  Patient presents with  . Telephone Screen  . Telephone Assessment    [phone]  . Sinusitis    Going on for 4 days OTC is not helping   . Cough    Dark yellowish   . Ear Pain    Right     HPI Pt is here for acute visit. She has been experiencing R ear pain, coughing up mucus, and has congestion. Started on Friday and has been getting worse. Tried flonase and zyrtec. A little wheezing but has nebulizer that she will use if needed. She is using it twice per day.  -Does not smoke, but has asthma. -stayed home from work yesterday due to not feeling well. No fevers, chills, body aches, N,or V.  A little diarrhea yesterday after eating something, but nothing since. -Covid vaccinated but no booster. No exposures or sick contacts.   Current Medication: Outpatient Encounter Medications as of 08/26/2020  Medication Sig  . albuterol (PROVENTIL HFA;VENTOLIN HFA) 108 (90 BASE) MCG/ACT inhaler Inhale 2 puffs into the lungs every 6 (six) hours as needed for wheezing or shortness of breath.  Marland Kitchen amoxicillin-clavulanate (AUGMENTIN) 875-125 MG tablet Take 1 tablet by mouth 2 (two) times daily. With food  . budesonide-formoterol (SYMBICORT) 160-4.5 MCG/ACT inhaler Inhale 2 puffs into the lungs every 12 (twelve) hours.  . gabapentin (NEURONTIN) 300  MG capsule Take 300 mg by mouth daily.  Marland Kitchen ipratropium-albuterol (DUONEB) 0.5-2.5 (3) MG/3ML SOLN USE 1 AMPULE IN NEBULIZER EVERY 4 HOURS AS NEEDED  . lamoTRIgine (LAMICTAL) 25 MG tablet Take 25 mg by mouth. Take four tablets po QAM and five tablets po QPM  . metoprolol succinate (TOPROL-XL) 50 MG 24 hr tablet Take 1.5 tablets po QD  . nortriptyline (PAMELOR) 10 MG capsule Take 10 mg by mouth. Take 1 tablet po QD and 4 tablets po QPM  . triamterene-hydrochlorothiazide (MAXZIDE-25) 37.5-25 MG tablet Take 1 tablet by mouth daily. Take one tab a day for fluid  . [DISCONTINUED] aluminum-magnesium hydroxide 200-200 MG/5ML suspension Take 10 mLs by mouth every 6 (six) hours as needed (foreign body sensation/esophageal burning). (Patient not taking: Reported on 08/26/2020)  . [DISCONTINUED] ARIPiprazole (ABILIFY) 10 MG tablet Take 10 mg by mouth daily. (Patient not taking: Reported on 08/26/2020)  . [DISCONTINUED] DULoxetine (CYMBALTA) 20 MG capsule Take 1 capsule (20 mg total) by mouth daily. For depression (Patient not taking: Reported on 08/26/2020)  . [DISCONTINUED] lidocaine (XYLOCAINE) 2 % solution Use as directed 10 mLs in the mouth or throat every 4 (four) hours as needed (foreign body sensation/burning). Swish, gargle, and spit (Patient not taking: Reported on 08/26/2020)  . [DISCONTINUED] LORazepam (ATIVAN) 1 MG tablet Take 1 mg by mouth every 8 (eight) hours. (Patient not taking: Reported on 08/26/2020)  . [DISCONTINUED] tiZANidine (ZANAFLEX) 2 MG tablet Take 2 mg by mouth 3 (three) times daily. (Patient not taking: Reported  on 08/26/2020)   No facility-administered encounter medications on file as of 08/26/2020.    Surgical History: Past Surgical History:  Procedure Laterality Date  . ABDOMINAL HYSTERECTOMY N/A 06/09/2015   Procedure: HYSTERECTOMY ABDOMINAL with Bilateral Salpingectomy;  Surgeon: Suzy Bouchard, MD;  Location: [facility] ORS;  Service: Gynecology;  Laterality: N/A;  . CESAREAN  SECTION  [id number]  . FINGER SURGERY  1992  . GASTRIC BYPASS    . INGUINAL HERNIA REPAIR Right 2010  . REPLACEMENT TOTAL KNEE Right     Medical History: Past Medical History:  Diagnosis Date  . Allergy    environmental  . Anemia   . Asthma   . Bronchitis, acute   . Complication of anesthesia    HAD ASTHMA ATTACK WHILE UNDER  . Gastroesophageal reflux disease without esophagitis 11/02/2017  . Hypertension   . Sleep apnea     Family History: Family History  Problem Relation Age of Onset  . Diabetes Mother   . Hypertension Mother   . Cancer Mother   . Congestive Heart Failure Father   . Diabetes Father     Social History   Socioeconomic History  . Marital status: Married    Spouse name: Not on file  . Number of children: Not on file  . Years of education: Not on file  . Highest education level: Not on file  Occupational History  . Not on file  Tobacco Use  . Smoking status: Never Smoker  . Smokeless tobacco: Never Used  Vaping Use  . Vaping Use: Never used  Substance and Sexual Activity  . Alcohol use: No  . Drug use: No  . Sexual activity: Yes  Other Topics Concern  . Not on file  Social History Narrative  . Not on file   Social Determinants of Health   Financial Resource Strain: Not on file  Food Insecurity: Not on file  Transportation Needs: Not on file  Physical Activity: Not on file  Stress: Not on file  Social Connections: Not on file  Intimate Partner Violence: Not on file      Review of Systems  Constitutional: Positive for fatigue. Negative for chills and fever.  HENT: Positive for congestion, ear pain, postnasal drip, rhinorrhea, sinus pressure and sinus pain. Negative for mouth sores.   Respiratory: Positive for cough and wheezing. Negative for shortness of breath.   Cardiovascular: Negative for chest pain.  Gastrointestinal: Positive for diarrhea. Negative for abdominal pain, blood in stool, constipation, nausea and vomiting.   Genitourinary: Negative for flank pain.  Neurological: Positive for headaches.  Psychiatric/Behavioral: Negative.     Vital Signs: Temp 98.5 F (36.9 C)   Ht 5\' 2"  (1.575 m)   Wt 260 lb (117.9 kg)   LMP 05/28/2015   BMI 47.55 kg/m    Observation/Objective:   Pt is able to carry out conversation.  Assessment/Plan: 1. Acute non-recurrent maxillary sinusitis Will start on Augmentin. Pt should continue zyrtec, flonase and may begin mucinex to help with congestion. She has f/u in office on Monday and will check in with her at visit to see how she is doing. Educated to stay well hydrated. - amoxicillin-clavulanate (AUGMENTIN) 875-125 MG tablet; Take 1 tablet by mouth 2 (two) times daily. With food  Dispense: 20 tablet; Refill: 0  2. Moderate persistent asthma without complication May continue nebulizer as needed for wheezing.   General Counseling: Sarajean verbalizes understanding of the findings of today's phone visit and agrees with plan of treatment. I have discussed  any further diagnostic evaluation that may be needed or ordered today. We also reviewed her medications today. she has been encouraged to call the office with any questions or concerns that should arise related to todays visit.    No orders of the defined types were placed in this encounter.   Meds ordered this encounter  Medications  . amoxicillin-clavulanate (AUGMENTIN) 875-125 MG tablet    Sig: Take 1 tablet by mouth 2 (two) times daily. With food    Dispense:  20 tablet    Refill:  0    Time spent:30 Minutes    Dr Lyndon Code Internal medicine

## 2020-09-01 ENCOUNTER — Encounter: Payer: Self-pay | Admitting: Internal Medicine

## 2020-09-01 ENCOUNTER — Ambulatory Visit: Payer: 59 | Admitting: Internal Medicine

## 2020-09-01 ENCOUNTER — Other Ambulatory Visit: Payer: Self-pay

## 2020-09-01 VITALS — BP 132/88 | HR 93 | Temp 98.6°F | Resp 16 | Ht 62.0 in | Wt 265.8 lb

## 2020-09-01 DIAGNOSIS — F445 Conversion disorder with seizures or convulsions: Secondary | ICD-10-CM

## 2020-09-01 DIAGNOSIS — G4733 Obstructive sleep apnea (adult) (pediatric): Secondary | ICD-10-CM | POA: Diagnosis not present

## 2020-09-01 DIAGNOSIS — I10 Essential (primary) hypertension: Secondary | ICD-10-CM

## 2020-09-01 DIAGNOSIS — R002 Palpitations: Secondary | ICD-10-CM | POA: Diagnosis not present

## 2020-09-01 DIAGNOSIS — Z9989 Dependence on other enabling machines and devices: Secondary | ICD-10-CM

## 2020-09-01 DIAGNOSIS — R7309 Other abnormal glucose: Secondary | ICD-10-CM

## 2020-09-01 DIAGNOSIS — Z6841 Body Mass Index (BMI) 40.0 and over, adult: Secondary | ICD-10-CM

## 2020-09-01 MED ORDER — RYBELSUS 3 MG PO TABS: 3.0000 mg | ORAL_TABLET | Freq: Every day | ORAL | 0 refills | Status: AC

## 2020-09-01 NOTE — Progress Notes (Signed)
[facility] [address] [place], [place] [address]  Internal MEDICINE  Office Visit Note  Patient Name: [name]  [id number]  [id number]  Date of Service: 09/11/2020  Chief Complaint  Patient presents with  . Hypertension    General fup  . Seizures  . Quality Metric Gaps    Hep C screen, HIV screen, Tdap, Colonoscopy, Mammogram, Covid 3 rd booster    HPI Pt is here for routine follow up. She has missed several office visits  Continues to see neurology for complex stress spells, headaches, and sleep difficulty, She also has Cervical disc disease Not happy with wt gain and will like to do something about it, baseline blood glucose is elevated as well Sleep is better, continue to take Nortriptyline  Denies any fever or chills     Current Medication: Outpatient Encounter Medications as of 09/01/2020  Medication Sig  . albuterol (PROVENTIL HFA;VENTOLIN HFA) 108 (90 BASE) MCG/ACT inhaler Inhale 2 puffs into the lungs every 6 (six) hours as needed for wheezing or shortness of breath.  Marland Kitchen amoxicillin-clavulanate (AUGMENTIN) 875-125 MG tablet Take 1 tablet by mouth 2 (two) times daily. With food  . ARIPiprazole (ABILIFY) 15 MG tablet Take 15 mg by mouth daily. Take 1 tablet by mouth at night  . budesonide-formoterol (SYMBICORT) 160-4.5 MCG/ACT inhaler Inhale 2 puffs into the lungs every 12 (twelve) hours.  . clonazePAM (KLONOPIN) 0.5 MG tablet Take 0.5 mg by mouth 2 (two) times daily as needed for anxiety. Take 1/2 tablet by mouth twice a day  . gabapentin (NEURONTIN) 300 MG capsule Take 300 mg by mouth daily. Take 1 to 2 capsules by mouth nightly for sleep  . ipratropium-albuterol (DUONEB) 0.5-2.5 (3) MG/3ML SOLN USE 1 AMPULE IN NEBULIZER EVERY 4 HOURS AS NEEDED  . lamoTRIgine (LAMICTAL) 25 MG tablet Take 25 mg by mouth. Take 1 tablet in the morning and 1 tablet at night  . metoprolol succinate (TOPROL-XL) 50 MG 24 hr tablet Take 1.5 tablets po QD  . nortriptyline  (PAMELOR) 10 MG capsule Take 10 mg by mouth. Take 1 tablet po  in the morning  . nortriptyline (PAMELOR) 50 MG capsule Take 50 mg by mouth at bedtime. Take 1 capsule by mouth at bedtime  . Semaglutide (RYBELSUS) 3 MG TABS Take 3 mg by mouth daily at 6 (six) AM.  . Semaglutide (RYBELSUS) 3 MG TABS Take one tab a day for elevated glucose  . traZODone (DESYREL) 50 MG tablet Take 50 mg by mouth at bedtime. Take 1 to 4 tablets by mouth nightly  . triamterene-hydrochlorothiazide (MAXZIDE-25) 37.5-25 MG tablet Take 1 tablet by mouth daily. Take one tab a day for fluid   No facility-administered encounter medications on file as of 09/01/2020.    Surgical History: Past Surgical History:  Procedure Laterality Date  . ABDOMINAL HYSTERECTOMY N/A 06/09/2015   Procedure: HYSTERECTOMY ABDOMINAL with Bilateral Salpingectomy;  Surgeon: Suzy Bouchard, MD;  Location: [facility] ORS;  Service: Gynecology;  Laterality: N/A;  . CESAREAN SECTION  [id number]  . FINGER SURGERY  1992  . GASTRIC BYPASS    . INGUINAL HERNIA REPAIR Right 2010  . REPLACEMENT TOTAL KNEE Right     Medical History: Past Medical History:  Diagnosis Date  . Allergy    environmental  . Anemia   . Asthma   . Bronchitis, acute   . Complication of anesthesia    HAD ASTHMA ATTACK WHILE UNDER  . Gastroesophageal reflux disease without esophagitis 11/02/2017  .  Hypertension   . Sleep apnea     Family History: Family History  Problem Relation Age of Onset  . Diabetes Mother   . Hypertension Mother   . Cancer Mother   . Congestive Heart Failure Father   . Diabetes Father     Social History   Socioeconomic History  . Marital status: Married    Spouse name: Not on file  . Number of children: Not on file  . Years of education: Not on file  . Highest education level: Not on file  Occupational History  . Not on file  Tobacco Use  . Smoking status: Never Smoker  . Smokeless tobacco: Never Used  Vaping Use  . Vaping Use: Never  used  Substance and Sexual Activity  . Alcohol use: No  . Drug use: No  . Sexual activity: Yes  Other Topics Concern  . Not on file  Social History Narrative  . Not on file   Social Determinants of Health   Financial Resource Strain: Not on file  Food Insecurity: Not on file  Transportation Needs: Not on file  Physical Activity: Not on file  Stress: Not on file  Social Connections: Not on file  Intimate Partner Violence: Not on file      Review of Systems  Constitutional: Positive for unexpected weight change. Negative for fatigue and fever.  HENT: Negative for congestion, mouth sores and postnasal drip.   Respiratory: Negative for cough.   Cardiovascular: Negative for chest pain.  Genitourinary: Negative for flank pain.  Musculoskeletal: Positive for arthralgias.  Psychiatric/Behavioral: Negative.     Vital Signs: BP 132/88   Pulse 93   Temp 98.6 F (37 C)   Resp 16   Ht 5\' 2"  (1.575 m)   Wt 265 lb 12.8 oz (120.6 kg)   LMP 05/28/2015   SpO2 99%   BMI 48.62 kg/m    Physical Exam Constitutional:      General: She is not in acute distress.    Appearance: She is well-developed. She is not diaphoretic.  HENT:     Head: Normocephalic and atraumatic.     Mouth/Throat:     Pharynx: No oropharyngeal exudate.  Eyes:     Pupils: Pupils are equal, round, and reactive to light.  Neck:     Thyroid: No thyromegaly.     Vascular: No JVD.     Trachea: No tracheal deviation.  Cardiovascular:     Rate and Rhythm: Normal rate and regular rhythm.     Heart sounds: Normal heart sounds. No murmur heard. No friction rub. No gallop.   Pulmonary:     Effort: Pulmonary effort is normal. No respiratory distress.     Breath sounds: No wheezing or rales.  Chest:     Chest wall: No tenderness.  Abdominal:     General: Bowel sounds are normal.     Palpations: Abdomen is soft.  Musculoskeletal:        General: Normal range of motion.     Cervical back: Normal range of  motion and neck supple.  Lymphadenopathy:     Cervical: No cervical adenopathy.  Skin:    General: Skin is warm and dry.  Neurological:     Mental Status: She is alert and oriented to person, place, and time.     Cranial Nerves: No cranial nerve deficit.  Psychiatric:        Behavior: Behavior normal.        Thought Content: Thought content normal.  Judgment: Judgment normal.     Assessment/Plan: 1. Essential hypertension Good control, will continue to monitor   2. Psychogenic nonepileptic seizure Followed by Neurology, no new spells   3. Palpitations Stable and controlled   4. OSA on CPAP Good compliance with CPAP  5. Elevated glucose will continue to monitor start Rybelsus 3 mg po qd.  Obesity Counseling: Risk Assessment: An assessment of behavioral risk factors was made today and includes lack of exercise sedentary lifestyle, lack of portion control and poor dietary habits.  Risk Modification Advice: She was counseled on portion control guidelines. Restricting daily caloric intake to 1800 The detrimental long term effects of obesity on her health and ongoing poor compliance was also discussed with the patient.   General Counseling: Noha verbalizes understanding of the findings of todays visit and agrees with plan of treatment. I have discussed any further diagnostic evaluation that may be needed or ordered today. We also reviewed her medications today. she has been encouraged to call the office with any questions or concerns that should arise related to todays visit.   Orders Placed This Encounter  Procedures  . CBC with Differential/Platelet  . Lipid Panel With LDL/HDL Ratio  . TSH  . T4, free  . Comprehensive metabolic panel    Meds ordered this encounter  Medications  . Semaglutide (RYBELSUS) 3 MG TABS    Sig: Take 3 mg by mouth daily at 6 (six) AM.    Dispense:  30 tablet    Refill:  0    <SAMPLE>  . Semaglutide (RYBELSUS) 3 MG TABS    Sig: Take  one tab a day for elevated glucose    Dispense:  30 tablet    Refill:  3    Total time spent: 30 Minutes Time spent includes review of chart, medications, test results, and follow up plan with the patient.   [place] Controlled Substance Database was reviewed by me.   Dr Lyndon Code Internal medicine

## 2020-09-11 MED ORDER — RYBELSUS 3 MG PO TABS: ORAL_TABLET | ORAL | 3 refills | Status: DC

## 2020-09-22 ENCOUNTER — Other Ambulatory Visit: Payer: Self-pay | Admitting: Internal Medicine

## 2020-09-22 DIAGNOSIS — R000 Tachycardia, unspecified: Secondary | ICD-10-CM

## 2020-10-02 ENCOUNTER — Other Ambulatory Visit: Payer: Self-pay

## 2020-10-02 ENCOUNTER — Other Ambulatory Visit
Admission: RE | Admit: 2020-10-02 | Discharge: 2020-10-02 | Disposition: A | Discharge status: Home / Self Care | Payer: 59 | Attending: Hospice and Palliative Medicine | Admitting: Hospice and Palliative Medicine

## 2020-10-02 DIAGNOSIS — R7309 Other abnormal glucose: Secondary | ICD-10-CM | POA: Diagnosis present

## 2020-10-02 DIAGNOSIS — G4733 Obstructive sleep apnea (adult) (pediatric): Secondary | ICD-10-CM | POA: Diagnosis present

## 2020-10-02 DIAGNOSIS — I10 Essential (primary) hypertension: Secondary | ICD-10-CM | POA: Diagnosis not present

## 2020-10-02 DIAGNOSIS — Z9989 Dependence on other enabling machines and devices: Secondary | ICD-10-CM | POA: Diagnosis present

## 2020-10-02 DIAGNOSIS — R002 Palpitations: Secondary | ICD-10-CM | POA: Insufficient documentation

## 2020-10-02 DIAGNOSIS — F445 Conversion disorder with seizures or convulsions: Secondary | ICD-10-CM | POA: Insufficient documentation

## 2020-10-02 LAB — COMPREHENSIVE METABOLIC PANEL
ALT: 20 U/L (ref 0–44)
AST: 21 U/L (ref 15–41)
Albumin: 3.8 g/dL (ref 3.5–5.0)
Alkaline Phosphatase: 82 U/L (ref 38–126)
Anion gap: 9 (ref 5–15)
BUN: 12 mg/dL (ref 6–20)
CO2: 28 mmol/L (ref 22–32)
Calcium: 9.1 mg/dL (ref 8.9–10.3)
Chloride: 98 mmol/L (ref 98–111)
Creatinine, Ser: 0.91 mg/dL (ref 0.44–1.00)
GFR, Estimated: >60 mL/min (ref >60)
Glucose, Bld: 102 mg/dL — ABNORMAL HIGH (ref 70–99)
Potassium: 3.6 mmol/L (ref 3.5–5.1)
Sodium: 135 mmol/L (ref 135–145)
Total Bilirubin: 0.6 mg/dL (ref 0.3–1.2)
Total Protein: 8.2 g/dL — ABNORMAL HIGH (ref 6.5–8.1)

## 2020-10-02 LAB — LIPID PANEL
Cholesterol: 193 mg/dL (ref 0–200)
HDL: 43 mg/dL (ref >40)
LDL Cholesterol: 115 mg/dL — ABNORMAL HIGH (ref 0–99)
Total CHOL/HDL Ratio: 4.5 RATIO
Triglycerides: 177 mg/dL — ABNORMAL HIGH (ref <150)
VLDL: 35 mg/dL (ref 0–40)

## 2020-10-02 LAB — CBC WITH DIFFERENTIAL/PLATELET
Abs Immature Granulocytes: 0.01 10*3/uL (ref 0.00–0.07)
Basophils Absolute: 0 10*3/uL (ref 0.0–0.1)
Basophils Relative: 1 %
Eosinophils Absolute: 0.2 10*3/uL (ref 0.0–0.5)
Eosinophils Relative: 3 %
HCT: 38.5 % (ref 36.0–46.0)
Hemoglobin: 12.9 g/dL (ref 12.0–15.0)
Immature Granulocytes: 0 %
Lymphocytes Relative: 44 %
Lymphs Abs: 2.6 10*3/uL (ref 0.7–4.0)
MCH: 29.7 pg (ref 26.0–34.0)
MCHC: 33.5 g/dL (ref 30.0–36.0)
MCV: 88.7 fL (ref 80.0–100.0)
Monocytes Absolute: 0.6 10*3/uL (ref 0.1–1.0)
Monocytes Relative: 10 %
Neutro Abs: 2.5 10*3/uL (ref 1.7–7.7)
Neutrophils Relative %: 42 %
Platelets: 273 10*3/uL (ref 150–400)
RBC: 4.34 MIL/uL (ref 3.87–5.11)
RDW: 13 % (ref 11.5–15.5)
WBC: 5.9 10*3/uL (ref 4.0–10.5)
nRBC: 0 % (ref 0.0–0.2)

## 2020-10-02 LAB — TSH: TSH: 1.234 u[IU]/mL (ref 0.350–4.500)

## 2020-10-02 LAB — T4, FREE: Free T4: 0.87 ng/dL (ref 0.61–1.12)

## 2020-10-02 NOTE — Progress Notes (Signed)
Labs reviewed, will be discussed at upcoming visit.

## 2020-10-05 ENCOUNTER — Other Ambulatory Visit: Payer: Self-pay | Admitting: Internal Medicine

## 2020-10-05 DIAGNOSIS — I10 Essential (primary) hypertension: Secondary | ICD-10-CM

## 2020-10-06 ENCOUNTER — Encounter: Payer: Self-pay | Admitting: Nurse Practitioner

## 2020-10-06 ENCOUNTER — Other Ambulatory Visit: Payer: Self-pay

## 2020-10-06 ENCOUNTER — Ambulatory Visit (INDEPENDENT_AMBULATORY_CARE_PROVIDER_SITE_OTHER): Payer: 59 | Admitting: Nurse Practitioner

## 2020-10-06 VITALS — BP 132/88 | HR 92 | Temp 98.5°F | Resp 16 | Ht 62.0 in | Wt 266.2 lb

## 2020-10-06 DIAGNOSIS — Z1231 Encounter for screening mammogram for malignant neoplasm of breast: Secondary | ICD-10-CM

## 2020-10-06 DIAGNOSIS — Z0001 Encounter for general adult medical examination with abnormal findings: Secondary | ICD-10-CM

## 2020-10-06 DIAGNOSIS — R7301 Impaired fasting glucose: Secondary | ICD-10-CM

## 2020-10-06 DIAGNOSIS — Z1211 Encounter for screening for malignant neoplasm of colon: Secondary | ICD-10-CM | POA: Diagnosis not present

## 2020-10-06 DIAGNOSIS — Z6841 Body Mass Index (BMI) 40.0 and over, adult: Secondary | ICD-10-CM | POA: Diagnosis not present

## 2020-10-06 DIAGNOSIS — F445 Conversion disorder with seizures or convulsions: Secondary | ICD-10-CM

## 2020-10-06 MED ORDER — RYBELSUS 7 MG PO TABS: 7.0000 mg | ORAL_TABLET | Freq: Every day | ORAL | 0 refills | Status: DC

## 2020-10-06 NOTE — Progress Notes (Signed)
Established Patient Office Visit  Subjective:  Patient ID: [name], female    DOB: 01/24/1971  Age: 50 y.o. MRN: [id number]  CC:  Chief Complaint  Patient presents with  . Annual Exam    Reill on meds  . Quality Metric Gaps    Mammogram, colonoscopy     Uvaldo Bristle  Past Medical History:  Diagnosis Date  . Allergy    environmental  . Anemia   . Asthma   . Bronchitis, acute   . Complication of anesthesia    HAD ASTHMA ATTACK WHILE UNDER  . Gastroesophageal reflux disease without esophagitis 11/02/2017  . Hypertension   . Sleep apnea     Past Surgical History:  Procedure Laterality Date  . ABDOMINAL HYSTERECTOMY N/A 06/09/2015   Procedure: HYSTERECTOMY ABDOMINAL with Bilateral Salpingectomy;  Surgeon: Suzy Bouchard, MD;  Location: [facility] ORS;  Service: Gynecology;  Laterality: N/A;  . CESAREAN SECTION  [id number]  . FINGER SURGERY  1992  . GASTRIC BYPASS    . INGUINAL HERNIA REPAIR Right 2010  . REPLACEMENT TOTAL KNEE Right     Family History  Problem Relation Age of Onset  . Diabetes Mother   . Hypertension Mother   . Cancer Mother   . Congestive Heart Failure Father   . Diabetes Father     Social History   Socioeconomic History  . Marital status: Married    Spouse name: Not on file  . Number of children: Not on file  . Years of education: Not on file  . Highest education level: Not on file  Occupational History  . Not on file  Tobacco Use  . Smoking status: Never Smoker  . Smokeless tobacco: Never Used  Vaping Use  . Vaping Use: Never used  Substance and Sexual Activity  . Alcohol use: No  . Drug use: No  . Sexual activity: Yes  Other Topics Concern  . Not on file  Social History Narrative  . Not on file   Social Determinants of Health   Financial Resource Strain: Not on file  Food Insecurity: Not on file  Transportation Needs: Not on file  Physical Activity: Not on file  Stress: Not on file  Social Connections: Not on  file  Intimate Partner Violence: Not on file    Outpatient Medications Prior to Visit  Medication Sig Dispense Refill  . albuterol (PROVENTIL HFA;VENTOLIN HFA) 108 (90 BASE) MCG/ACT inhaler Inhale 2 puffs into the lungs every 6 (six) hours as needed for wheezing or shortness of breath.    Marland Kitchen amoxicillin-clavulanate (AUGMENTIN) 875-125 MG tablet Take 1 tablet by mouth 2 (two) times daily. With food 20 tablet 0  . ARIPiprazole (ABILIFY) 15 MG tablet Take 15 mg by mouth daily. Take 1 tablet by mouth at night    . budesonide-formoterol (SYMBICORT) 160-4.5 MCG/ACT inhaler Inhale 2 puffs into the lungs every 12 (twelve) hours.    . clonazePAM (KLONOPIN) 0.5 MG tablet Take 0.5 mg by mouth 2 (two) times daily as needed for anxiety. Take 1/2 tablet by mouth twice a day    . gabapentin (NEURONTIN) 300 MG capsule Take 300 mg by mouth daily. Take 1 to 2 capsules by mouth nightly for sleep    . ipratropium-albuterol (DUONEB) 0.5-2.5 (3) MG/3ML SOLN USE 1 AMPULE IN NEBULIZER EVERY 4 HOURS AS NEEDED 360 mL 0  . lamoTRIgine (LAMICTAL) 25 MG tablet Take 25 mg by mouth. Take 1 tablet in the morning and 1 tablet at night    .  metoprolol succinate (TOPROL-XL) 50 MG 24 hr tablet TAKE 1 & 1/2 (ONE & ONE-HALF) TABLETS BY MOUTH ONCE DAILY 45 tablet 0  . nortriptyline (PAMELOR) 10 MG capsule Take 10 mg by mouth. Take 1 tablet po  in the morning    . nortriptyline (PAMELOR) 50 MG capsule Take 50 mg by mouth at bedtime. Take 1 capsule by mouth at bedtime    . traZODone (DESYREL) 50 MG tablet Take 50 mg by mouth at bedtime. Take 1 to 4 tablets by mouth nightly    . Semaglutide (RYBELSUS) 3 MG TABS Take one tab a day for elevated glucose 30 tablet 3  . triamterene-hydrochlorothiazide (MAXZIDE-25) 37.5-25 MG tablet Take 1 tablet by mouth daily. Take one tab a day for fluid 90 tablet 0   No facility-administered medications prior to visit.    Allergies  Allergen Reactions  . Keppra [Levetiracetam] Other (See Comments)     hallucinations   HPI Pt present for annual physical.  -was started on rybelsus 3 mg last month for weight management. Pt states she has gained 1 lb. -followed by neurology for seizure-like activity, was started on trazodone recently.  -needs to follow up with sleep clinic- it has been a while per patient -routine screenings are due will address in plan Pt denies any pain and denies any other concerns at this time.    ROS Review of Systems  Constitutional: Negative.   HENT: Negative.   Eyes: Negative.   Respiratory: Positive for shortness of breath.        Short of breath with talking and making the bed.   Cardiovascular: Negative.   Gastrointestinal: Positive for constipation.       Last BM today but it was hard  Endocrine: Negative.   Genitourinary: Negative.   Musculoskeletal: Positive for back pain.       Back pain with walking  Skin: Negative.   Allergic/Immunologic: Negative.   Neurological: Positive for headaches.  Hematological: Negative.   Psychiatric/Behavioral: Negative.       Objective:    Physical Exam Constitutional:      Appearance: Normal appearance. She is obese.  HENT:     Head: Normocephalic and atraumatic.  Cardiovascular:     Rate and Rhythm: Normal rate and regular rhythm.     Pulses: Normal pulses.     Heart sounds: Normal heart sounds.  Pulmonary:     Effort: Pulmonary effort is normal.     Breath sounds: Normal breath sounds.  Musculoskeletal:     Cervical back: Normal range of motion and neck supple.  Skin:    General: Skin is warm and dry.     Capillary Refill: Capillary refill takes less than 2 seconds.  Neurological:     General: No focal deficit present.     Mental Status: She is alert and oriented to person, place, and time.     BP 132/88   Pulse 92   Temp 98.5 F (36.9 C)   Resp 16   Ht 5\' 2"  (1.575 m)   Wt 266 lb 3.2 oz (120.7 kg)   LMP 05/28/2015   SpO2 96%   BMI 48.69 kg/m  Wt Readings from Last 3 Encounters:   10/06/20 266 lb 3.2 oz (120.7 kg)  09/01/20 265 lb 12.8 oz (120.6 kg)  08/26/20 260 lb (117.9 kg)     Health Maintenance Due  Topic Date Due  . HIV Screening  Never done  . Hepatitis C Screening  Never done  .  TETANUS/TDAP  Never done  . COLONOSCOPY (Pts 45-19yrs Insurance coverage will need to be confirmed)  Never done  . MAMMOGRAM  Never done    There are no preventive care reminders to display for this patient.  Lab Results  Component Value Date   TSH 1.234 10/02/2020   Lab Results  Component Value Date   WBC 5.9 10/02/2020   HGB 12.9 10/02/2020   HCT 38.5 10/02/2020   MCV 88.7 10/02/2020   PLT 273 10/02/2020   Lab Results  Component Value Date   NA 135 10/02/2020   K 3.6 10/02/2020   CO2 28 10/02/2020   GLUCOSE 102 (H) 10/02/2020   BUN 12 10/02/2020   CREATININE 0.91 10/02/2020   BILITOT 0.6 10/02/2020   ALKPHOS 82 10/02/2020   AST 21 10/02/2020   ALT 20 10/02/2020   PROT 8.2 (H) 10/02/2020   ALBUMIN 3.8 10/02/2020   CALCIUM 9.1 10/02/2020   ANIONGAP 9 10/02/2020   Lab Results  Component Value Date   CHOL 193 10/02/2020   Lab Results  Component Value Date   HDL 43 10/02/2020   Lab Results  Component Value Date   LDLCALC 115 (H) 10/02/2020   Lab Results  Component Value Date   TRIG 177 (H) 10/02/2020   Lab Results  Component Value Date   CHOLHDL 4.5 10/02/2020   Lab Results  Component Value Date   HGBA1C 5.4 08/05/2016      Assessment & Plan:   Problem List Items Addressed This Visit   None   Visit Diagnoses    BMI 45.0-49.9, adult (HCC)       Relevant Medications   RYBELSUS 7 MG TABS   Visit for screening mammogram       Relevant Orders   MM Digital Screening   Screening for colon cancer       Relevant Orders   Ambulatory referral to Gastroenterology      Meds ordered this encounter  Medications  . RYBELSUS 7 MG TABS    Sig: Take 7 mg by mouth daily.    Dispense:  30 tablet    Refill:  0  1. Encounter for general  adult medical examination with abnormal findings Updated age appropriate preventive HM   2. Psychogenic nonepileptic seizure Followed by neurology, will monitor along  3. BMI 45.0-49.9, adult (HCC) Pt prescribed rybelsus 3mg  daily last month, gained 1 lb since last office visit. rybelsus dose increased, follow up weight check and management in 1 month  Obesity Counseling: Risk Assessment: An assessment of behavioral risk factors was made today and includes lack of exercise sedentary lifestyle, lack of portion control and poor dietary habits.  Risk Modification Advice: She was counseled on portion control guidelines. Restricting daily caloric intake to 1500 calories. The detrimental long term effects of obesity on her health and ongoing poor compliance was also discussed with the patient.  - RYBELSUS 7 MG TABS; Take 7 mg by mouth daily.  Dispense: 30 tablet; Refill: 0  4. Visit for screening mammogram Pt has never had a screening mammogram and declined clinical breast exam. Pt agreeable to a screening mammogram. - MM Digital Screening; Future  5. Screening for colon cancer Pt due for colonoscopy for colorectal cancer screening. She has never had a colonoscopy and declined the stool.   - Ambulatory referral to Gastroenterology  6. Impaired fasting glucose Pt has abnormal glucose, high BMI, will get benefit from GLP1 therapy for risk reduction    Follow-up: Return in about  4 weeks (around 11/03/2020) for F/U, weight loss, Nadra Hritz with [facility].    Beverely Risen, MD

## 2020-10-08 ENCOUNTER — Telehealth: Payer: Self-pay

## 2020-10-09 NOTE — Telephone Encounter (Signed)
Pt needs PA for Rybelsus 7MG 

## 2020-10-14 ENCOUNTER — Other Ambulatory Visit: Payer: Self-pay

## 2020-10-14 DIAGNOSIS — Z1211 Encounter for screening for malignant neoplasm of colon: Secondary | ICD-10-CM

## 2020-10-14 MED ORDER — SUPREP BOWEL PREP KIT 17.5-3.13-1.6 GM/177ML PO SOLN: 1.0000 | ORAL | 0 refills | Status: DC

## 2020-10-15 ENCOUNTER — Encounter: Payer: Self-pay | Admitting: Gastroenterology

## 2020-10-15 ENCOUNTER — Telehealth: Payer: Self-pay

## 2020-10-15 NOTE — Telephone Encounter (Signed)
Can we get some samples for her for 7 and 14 mg

## 2020-10-15 NOTE — Telephone Encounter (Signed)
Yes we have Ozempic, which dose would you like her to have?

## 2020-10-15 NOTE — Telephone Encounter (Signed)
See if she can come tomorrow, nursing visit

## 2020-10-15 NOTE — Telephone Encounter (Signed)
We currently do not have any samples, what is the next option?

## 2020-10-15 NOTE — Telephone Encounter (Signed)
Do we have Ozempic

## 2020-10-16 ENCOUNTER — Telehealth: Payer: Self-pay

## 2020-10-17 NOTE — Telephone Encounter (Signed)
Pt made nurse visit

## 2020-10-23 ENCOUNTER — Telehealth: Payer: Self-pay

## 2020-10-23 ENCOUNTER — Ambulatory Visit (INDEPENDENT_AMBULATORY_CARE_PROVIDER_SITE_OTHER): Payer: 59

## 2020-10-23 ENCOUNTER — Other Ambulatory Visit: Payer: Self-pay

## 2020-10-23 DIAGNOSIS — Z7189 Other specified counseling: Secondary | ICD-10-CM | POA: Diagnosis not present

## 2020-10-23 NOTE — Discharge Instructions (Signed)

## 2020-10-23 NOTE — Telephone Encounter (Signed)
Gastroenterology Pre-Procedure Review  Request Date: 10/24/20 Requesting Physician: Dr. Allen Norris  PATIENT REVIEW QUESTIONS: The patient responded to the following health history questions as indicated:    1. Are you having any GI issues? no 2. Do you have a personal history of Polyps? no 3. Do you have a family history of Colon Cancer or Polyps? yes (mother had polyps) 4. Diabetes Mellitus? no 5. Joint replacements in the past 12 months?no 6. Major health problems in the past 3 months?no 7. Any artificial heart valves, MVP, or defibrillator?no    MEDICATIONS & ALLERGIES:    Patient reports the following regarding taking any anticoagulation/antiplatelet therapy:   Plavix, Coumadin, Eliquis, Xarelto, Lovenox, Pradaxa, Brilinta, or Effient? no Aspirin? yes (ASA 17m)  Patient confirms/reports the following medications:  Current Outpatient Medications  Medication Sig Dispense Refill  . albuterol (PROVENTIL HFA;VENTOLIN HFA) 108 (90 BASE) MCG/ACT inhaler Inhale 2 puffs into the lungs every 6 (six) hours as needed for wheezing or shortness of breath.    . ARIPiprazole (ABILIFY) 15 MG tablet Take 15 mg by mouth daily. Take 1 tablet by mouth at night    . budesonide-formoterol (SYMBICORT) 160-4.5 MCG/ACT inhaler Inhale 2 puffs into the lungs every 12 (twelve) hours.    . clonazePAM (KLONOPIN) 0.5 MG tablet Take 0.5 mg by mouth 2 (two) times daily as needed for anxiety. Take 1/2 tablet by mouth twice a day    . gabapentin (NEURONTIN) 300 MG capsule Take 300 mg by mouth daily. Take 1 to 2 capsules by mouth nightly for sleep    . ipratropium-albuterol (DUONEB) 0.5-2.5 (3) MG/3ML SOLN USE 1 AMPULE IN NEBULIZER EVERY 4 HOURS AS NEEDED 360 mL 0  . lamoTRIgine (LAMICTAL) 25 MG tablet Take 25 mg by mouth. Take 1 tablet in the morning and 1 tablet at night    . metoprolol succinate (TOPROL-XL) 50 MG 24 hr tablet TAKE 1 & 1/2 (ONE & ONE-HALF) TABLETS BY MOUTH ONCE DAILY 45 tablet 0  . Na Sulfate-K Sulfate-Mg  Sulf (SUPREP BOWEL PREP KIT) 17.5-3.13-1.6 GM/177ML SOLN Take 1 kit by mouth as directed. 354 mL 0  . nortriptyline (PAMELOR) 10 MG capsule Take 10 mg by mouth. Take 1 tablet po  in the morning    . nortriptyline (PAMELOR) 50 MG capsule Take 50 mg by mouth at bedtime. Take 1 capsule by mouth at bedtime    . RYBELSUS 7 MG TABS Take 7 mg by mouth daily. (Patient not taking: Reported on 10/15/2020) 30 tablet 0  . traZODone (DESYREL) 50 MG tablet Take 50 mg by mouth at bedtime. Take 1 to 4 tablets by mouth nightly    . triamterene-hydrochlorothiazide (MAXZIDE-25) 37.5-25 MG tablet TAKE 1 TABLET BY MOUTH ONCE DAILY FOR FLUID 90 tablet 1   No current facility-administered medications for this visit.    Patient confirms/reports the following allergies:  Allergies  Allergen Reactions  . Keppra [Levetiracetam] Other (See Comments)    hallucinations    No orders of the defined types were placed in this encounter.   AUTHORIZATION INFORMATION Primary Insurance: 1D#: Group #:  Secondary Insurance: 1D#: Group #:  SCHEDULE INFORMATION: Date:  Time: Location:

## 2020-10-24 ENCOUNTER — Encounter: Payer: Self-pay | Admitting: Gastroenterology

## 2020-10-24 ENCOUNTER — Ambulatory Visit: Payer: 59 | Admitting: Anesthesiology

## 2020-10-24 ENCOUNTER — Ambulatory Visit
Admission: RE | Admit: 2020-10-24 | Discharge: 2020-10-24 | Disposition: A | Discharge status: Home / Self Care | Payer: 59 | Attending: Gastroenterology | Admitting: Gastroenterology

## 2020-10-24 ENCOUNTER — Encounter
Admission: RE | Disposition: A | Discharge status: Home / Self Care | Payer: Self-pay | Source: Home / Self Care | Attending: Gastroenterology

## 2020-10-24 DIAGNOSIS — R569 Unspecified convulsions: Secondary | ICD-10-CM | POA: Diagnosis not present

## 2020-10-24 DIAGNOSIS — Z9884 Bariatric surgery status: Secondary | ICD-10-CM | POA: Diagnosis not present

## 2020-10-24 DIAGNOSIS — Z8249 Family history of ischemic heart disease and other diseases of the circulatory system: Secondary | ICD-10-CM | POA: Diagnosis not present

## 2020-10-24 DIAGNOSIS — Z79899 Other long term (current) drug therapy: Secondary | ICD-10-CM | POA: Diagnosis not present

## 2020-10-24 DIAGNOSIS — Z9079 Acquired absence of other genital organ(s): Secondary | ICD-10-CM | POA: Diagnosis not present

## 2020-10-24 DIAGNOSIS — Z881 Allergy status to other antibiotic agents status: Secondary | ICD-10-CM | POA: Diagnosis not present

## 2020-10-24 DIAGNOSIS — Z7951 Long term (current) use of inhaled steroids: Secondary | ICD-10-CM | POA: Insufficient documentation

## 2020-10-24 DIAGNOSIS — I10 Essential (primary) hypertension: Secondary | ICD-10-CM | POA: Diagnosis not present

## 2020-10-24 DIAGNOSIS — Z1211 Encounter for screening for malignant neoplasm of colon: Secondary | ICD-10-CM | POA: Diagnosis not present

## 2020-10-24 DIAGNOSIS — Z96651 Presence of right artificial knee joint: Secondary | ICD-10-CM | POA: Insufficient documentation

## 2020-10-24 DIAGNOSIS — Z90710 Acquired absence of both cervix and uterus: Secondary | ICD-10-CM | POA: Insufficient documentation

## 2020-10-24 DIAGNOSIS — G4730 Sleep apnea, unspecified: Secondary | ICD-10-CM | POA: Diagnosis not present

## 2020-10-24 DIAGNOSIS — K648 Other hemorrhoids: Secondary | ICD-10-CM | POA: Diagnosis not present

## 2020-10-24 HISTORY — PX: COLONOSCOPY WITH PROPOFOL: SHX5780

## 2020-10-24 HISTORY — DX: Unspecified convulsions: R56.9

## 2020-10-24 SURGERY — COLONOSCOPY WITH PROPOFOL
Anesthesia: General | Site: Rectum

## 2020-10-24 MED ORDER — STERILE WATER FOR IRRIGATION IR SOLN
Status: DC | PRN
  Administered 2020-10-24: 15 mL

## 2020-10-24 MED ORDER — SODIUM CHLORIDE 0.9 % IV SOLN: INTRAVENOUS | Status: DC

## 2020-10-24 MED ORDER — PROPOFOL 10 MG/ML IV BOLUS
INTRAVENOUS | Status: DC | PRN
  Administered 2020-10-24: 20 mg via INTRAVENOUS
  Administered 2020-10-24: 80 mg via INTRAVENOUS
  Administered 2020-10-24 (×2): 30 mg via INTRAVENOUS
  Administered 2020-10-24 (×2): 20 mg via INTRAVENOUS
  Administered 2020-10-24: 40 mg via INTRAVENOUS
  Administered 2020-10-24: 20 mg via INTRAVENOUS
  Administered 2020-10-24 (×2): 30 mg via INTRAVENOUS
  Administered 2020-10-24 (×2): 20 mg via INTRAVENOUS

## 2020-10-24 MED ORDER — ACETAMINOPHEN 160 MG/5ML PO SOLN: 325.0000 mg | ORAL | Status: DC | PRN

## 2020-10-24 MED ORDER — LIDOCAINE HCL (CARDIAC) PF 100 MG/5ML IV SOSY
PREFILLED_SYRINGE | INTRAVENOUS | Status: DC | PRN
  Administered 2020-10-24: 50 mg via INTRAVENOUS

## 2020-10-24 MED ORDER — ACETAMINOPHEN 325 MG PO TABS: 325.0000 mg | ORAL_TABLET | ORAL | Status: DC | PRN

## 2020-10-24 MED ORDER — LACTATED RINGERS IV SOLN: INTRAVENOUS | Status: DC

## 2020-10-24 SURGICAL SUPPLY — 22 items
CLIP HMST 235XBRD CATH ROT (MISCELLANEOUS) IMPLANT
CLIP RESOLUTION 360 11X235 (MISCELLANEOUS)
ELECT REM PT RETURN 9FT ADLT (ELECTROSURGICAL)
ELECTRODE REM PT RTRN 9FT ADLT (ELECTROSURGICAL) IMPLANT
FORCEPS BIOP RAD 4 LRG CAP 4 (CUTTING FORCEPS) IMPLANT
GOWN CVR UNV OPN BCK APRN NK (MISCELLANEOUS) ×2 IMPLANT
GOWN ISOL THUMB LOOP REG UNIV (MISCELLANEOUS) ×2
INJECTOR VARIJECT VIN23 (MISCELLANEOUS) IMPLANT
KIT DEFENDO VALVE AND CONN (KITS) IMPLANT
KIT PRC NS LF DISP ENDO (KITS) ×1 IMPLANT
KIT PROCEDURE OLYMPUS (KITS) ×1
MANIFOLD NEPTUNE II (INSTRUMENTS) ×2 IMPLANT
MARKER SPOT ENDO TATTOO 5ML (MISCELLANEOUS) IMPLANT
PROBE APC STR FIRE (PROBE) IMPLANT
RETRIEVER NET ROTH 2.5X230 LF (MISCELLANEOUS) IMPLANT
SNARE COLD EXACTO (MISCELLANEOUS) IMPLANT
SNARE SHORT THROW 13M SML OVAL (MISCELLANEOUS) IMPLANT
SNARE SNG USE RND 15MM (INSTRUMENTS) IMPLANT
SPOT EX ENDOSCOPIC TATTOO (MISCELLANEOUS)
TRAP ETRAP POLY (MISCELLANEOUS) IMPLANT
VARIJECT INJECTOR VIN23 (MISCELLANEOUS)
WATER STERILE IRR 250ML POUR (IV SOLUTION) ×2 IMPLANT

## 2020-10-24 NOTE — Op Note (Signed)
[facility] Patient Name: [name] Procedure Date: 10/24/2020 7:53 AM MRN: [id number] Account #: [id number] Date of Birth: 09-12-1970 Admit Type: Outpatient Age: 50 Room: [facility] OR ROOM 01 Gender: Female Note Status: Finalized Procedure:             Colonoscopy Indications:           Screening for colorectal malignant neoplasm Providers:             Midge Minium MD, MD Referring MD:          Lyndon Code, MD (Referring MD) Medicines:             Propofol per Anesthesia Complications:         No immediate complications. Procedure:             Pre-Anesthesia Assessment:                        - Prior to the procedure, a History and Physical was                         performed, and patient medications and allergies were                         reviewed. The patient's tolerance of previous                         anesthesia was also reviewed. The risks and benefits                         of the procedure and the sedation options and risks                         were discussed with the patient. All questions were                         answered, and informed consent was obtained. Prior                         Anticoagulants: The patient has taken no previous                         anticoagulant or antiplatelet agents. ASA Grade                         Assessment: II - A patient with mild systemic disease.                         After reviewing the risks and benefits, the patient                         was deemed in satisfactory condition to undergo the                         procedure.                        After obtaining informed consent, the colonoscope was  passed under direct vision. Throughout the procedure,                         the patient's blood pressure, pulse, and oxygen                         saturations were monitored continuously. The was                         introduced through the anus and  advanced to the the                         cecum, identified by appendiceal orifice and ileocecal                         valve. The colonoscopy was performed without                         difficulty. The patient tolerated the procedure well.                         The quality of the bowel preparation was excellent. Findings:      The perianal and digital rectal examinations were normal.      Non-bleeding internal hemorrhoids were found during retroflexion. The       hemorrhoids were Grade I (internal hemorrhoids that do not prolapse).      The exam was otherwise without abnormality. Impression:            - Non-bleeding internal hemorrhoids.                        - The examination was otherwise normal.                        - No specimens collected. Recommendation:        - Discharge patient to home.                        - Resume previous diet.                        - Continue present medications.                        - Repeat colonoscopy in 10 years for screening                         purposes. Procedure Code(s):     --- Professional ---                        [phone], Colonoscopy, flexible; diagnostic, including                         collection of specimen(s) by brushing or washing, when                         performed (separate procedure) Diagnosis Code(s):     --- Professional ---  Z12.11, Encounter for screening for malignant neoplasm                         of colon CPT copyright 2019 American [employer] Association. All rights reserved. The codes documented in this report are preliminary and upon coder review may  be revised to meet current compliance requirements. Midge Minium MD, MD 10/24/2020 8:19:14 AM This report has been signed electronically. Number of Addenda: 0 Note Initiated On: 10/24/2020 7:53 AM Scope Withdrawal Time: 0 hours 7 minutes 12 seconds  Total Procedure Duration: 0 hours 14 minutes 31 seconds  Estimated Blood Loss:   Estimated blood loss: none.      [facility]

## 2020-10-24 NOTE — Transfer of Care (Signed)
Immediate Anesthesia Transfer of Care Note  Patient: [name]  Procedure(s) Performed: COLONOSCOPY WITH PROPOFOL (N/A Rectum)  Patient Location: PACU  Anesthesia Type: General  Level of Consciousness: awake, alert  and patient cooperative  Airway and Oxygen Therapy: Patient Spontanous Breathing and Patient connected to supplemental oxygen  Post-op Assessment: Post-op Vital signs reviewed, Patient's Cardiovascular Status Stable, Respiratory Function Stable, Patent Airway and No signs of Nausea or vomiting  Post-op Vital Signs: Reviewed and stable  Complications: No complications documented.

## 2020-10-24 NOTE — Anesthesia Procedure Notes (Signed)
Performed by: Gimena Buick, CRNA Pre-anesthesia Checklist: Patient identified, Emergency Drugs available, Suction available, Timeout performed and Patient being monitored Patient Re-evaluated:Patient Re-evaluated prior to induction Oxygen Delivery Method: Nasal cannula Placement Confirmation: positive ETCO2       

## 2020-10-24 NOTE — Anesthesia Postprocedure Evaluation (Signed)
Anesthesia Post Note  Patient: [name]  Procedure(s) Performed: COLONOSCOPY WITH PROPOFOL (N/A Rectum)     Patient location during evaluation: PACU Anesthesia Type: General Level of consciousness: awake and alert Pain management: pain level controlled Vital Signs Assessment: post-procedure vital signs reviewed and stable Respiratory status: spontaneous breathing, nonlabored ventilation, respiratory function stable and patient connected to nasal cannula oxygen Cardiovascular status: blood pressure returned to baseline and stable Postop Assessment: no apparent nausea or vomiting Anesthetic complications: no   No complications documented.  Alta Corning

## 2020-10-24 NOTE — H&P (Signed)
Jessica Lame, MD [address] [name], [place] [address] Phone: [phone] Fax : [phone]  Primary Care Physician:  Lavera Guise, MD Primary Gastroenterologist:  Dr. Allen Norris  Pre-Procedure History & Physical: HPI:  [name] is a 50 y.o. female is here for a screening colonoscopy.   Past Medical History:  Diagnosis Date  . Allergy    environmental  . Anemia   . Asthma   . Bronchitis, acute   . Complication of anesthesia    HAD ASTHMA ATTACK WHILE UNDER  . Gastroesophageal reflux disease without esophagitis 11/02/2017  . Hypertension   . Seizure ([place])    None this year (2022)  . Sleep apnea    CPAP    Past Surgical History:  Procedure Laterality Date  . ABDOMINAL HYSTERECTOMY N/A 06/09/2015   Procedure: HYSTERECTOMY ABDOMINAL with Bilateral Salpingectomy;  Surgeon: Boykin Nearing, MD;  Location: [facility] ORS;  Service: Gynecology;  Laterality: N/A;  . CESAREAN SECTION  [id number]  . [place]  . GASTRIC BYPASS    . INGUINAL HERNIA REPAIR Right 2010  . REPLACEMENT TOTAL KNEE Right     Prior to Admission medications   Medication Sig Start Date End Date Taking? Authorizing Provider  albuterol (PROVENTIL HFA;VENTOLIN HFA) 108 (90 BASE) MCG/ACT inhaler Inhale 2 puffs into the lungs every 6 (six) hours as needed for wheezing or shortness of breath.   Yes [provider]  ARIPiprazole (ABILIFY) 15 MG tablet Take 15 mg by mouth daily. Take 1 tablet by mouth at night   Yes [provider]  budesonide-formoterol (SYMBICORT) 160-4.5 MCG/ACT inhaler Inhale 2 puffs into the lungs every 12 (twelve) hours.   Yes [provider]  clonazePAM (KLONOPIN) 0.5 MG tablet Take 0.5 mg by mouth 2 (two) times daily as needed for anxiety. Take 1/2 tablet by mouth twice a day   Yes [provider]  gabapentin (NEURONTIN) 300 MG capsule Take 300 mg by mouth daily. Take 1 to 2 capsules by mouth nightly for sleep   Yes [provider]  ipratropium-albuterol (DUONEB) 0.5-2.5 (3) MG/3ML SOLN USE 1 AMPULE IN NEBULIZER EVERY 4 HOURS AS NEEDED 04/02/19  Yes [place], Audie Clear, NP  lamoTRIgine (LAMICTAL) 25 MG tablet Take 25 mg by mouth. Take 1 tablet in the morning and 1 tablet at night   Yes Anabel Bene, MD  metoprolol succinate (TOPROL-XL) 50 MG 24 hr tablet TAKE 1 & 1/2 (ONE & ONE-HALF) TABLETS BY MOUTH ONCE DAILY 09/22/20  Yes Lavera Guise, MD  Na Sulfate-K Sulfate-Mg Sulf (SUPREP BOWEL PREP KIT) 17.5-3.13-1.6 GM/177ML SOLN Take 1 kit by mouth as directed. 10/14/20  Yes Jessica Lame, MD  nortriptyline (PAMELOR) 10 MG capsule Take 10 mg by mouth. Take 1 tablet po  in the morning   Yes Anabel Bene, MD  nortriptyline (PAMELOR) 50 MG capsule Take 50 mg by mouth at bedtime. Take 1 capsule by mouth at bedtime   Yes [provider]  traZODone (DESYREL) 50 MG tablet Take 50 mg by mouth at bedtime. Take 1 to 4 tablets by mouth nightly   Yes [provider]  triamterene-hydrochlorothiazide (MAXZIDE-25) 37.5-25 MG tablet TAKE 1 TABLET BY MOUTH ONCE DAILY FOR FLUID 10/06/20  Yes Lavera Guise, MD  RYBELSUS 7 MG TABS Take 7 mg by mouth daily. Patient not taking: Reported on 10/15/2020 10/06/20   Jonetta Osgood, NP    Allergies as of 10/14/2020 - Review Complete 10/06/2020  Allergen Reaction Noted  . Keppra [levetiracetam] Other (See Comments) 09/05/2019    Family History  Problem Relation Age of Onset  . Diabetes Mother   . Hypertension Mother   . Cancer Mother   . Congestive Heart Failure Father   . Diabetes Father     Social History   Socioeconomic History  . Marital status: Married    Spouse name: Not on file  . Number of children: Not on file  . Years of education: Not on file  . Highest education level: Not on file  Occupational History  . Not on file  Tobacco Use  . Smoking status: Never Smoker  . Smokeless tobacco: Never Used  Vaping Use  . Vaping Use: Never used  Substance  and Sexual Activity  . Alcohol use: No  . Drug use: No  . Sexual activity: Yes  Other Topics Concern  . Not on file  Social History Narrative  . Not on file   Social Determinants of Health   Financial Resource Strain: Not on file  Food Insecurity: Not on file  Transportation Needs: Not on file  Physical Activity: Not on file  Stress: Not on file  Social Connections: Not on file  Intimate Partner Violence: Not on file    Review of Systems: See HPI, otherwise negative ROS  Physical Exam: BP (!) 135/94   Pulse 99   Temp (!) 97 F (36.1 C) (Temporal)   Resp 16   Ht _0  (1.575 m)   Wt 117 kg   LMP 05/28/2015   SpO2 99%   BMI 47.19 kg/m  General:   Alert,  pleasant and cooperative in NAD Head:  Normocephalic and atraumatic. Neck:  Supple; no masses or thyromegaly. Lungs:  Clear throughout to auscultation.    Heart:  Regular rate and rhythm. Abdomen:  Soft, nontender and nondistended. Normal bowel sounds, without guarding, and without rebound.   Neurologic:  Alert and  oriented x4;  grossly normal neurologically.  Impression/Plan: [name] is now here to undergo a screening colonoscopy.  Risks, benefits, and alternatives regarding colonoscopy have been reviewed with the patient.  Questions have been answered.  All parties agreeable.

## 2020-10-24 NOTE — Anesthesia Preprocedure Evaluation (Signed)
Anesthesia Evaluation  Patient identified by MRN, date of birth, ID band Patient awake    Reviewed: Allergy & Precautions, H&P , NPO status , Patient's Chart, lab work & pertinent test results, reviewed documented beta blocker date and time   Airway Mallampati: II  TM Distance: >3 FB Neck ROM: full    Dental no notable dental hx.    Pulmonary asthma , sleep apnea ,    Pulmonary exam normal breath sounds clear to auscultation       Cardiovascular Exercise Tolerance: Good hypertension, negative cardio ROS Normal cardiovascular exam Rhythm:regular Rate:Normal     Neuro/Psych negative neurological ROS  negative psych ROS   GI/Hepatic Neg liver ROS, GERD  ,Hx of gastric bypass   Endo/Other  Morbid obesity  Renal/GU negative Renal ROS  negative genitourinary   Musculoskeletal   Abdominal   Peds  Hematology negative hematology ROS (+)   Anesthesia Other Findings   Reproductive/Obstetrics negative OB ROS                             Anesthesia Physical Anesthesia Plan  ASA: III  Anesthesia Plan: General   Post-op Pain Management:    Induction:   PONV Risk Score and Plan:   Airway Management Planned:   Additional Equipment:   Intra-op Plan:   Post-operative Plan:   Informed Consent: I have reviewed the patients History and Physical, chart, labs and discussed the procedure including the risks, benefits and alternatives for the proposed anesthesia with the patient or authorized representative who has indicated his/her understanding and acceptance.     Dental Advisory Given  Plan Discussed with: CRNA and Anesthesiologist  Anesthesia Plan Comments:         Anesthesia Quick Evaluation

## 2020-10-28 ENCOUNTER — Encounter: Payer: Self-pay | Admitting: Gastroenterology

## 2020-10-29 NOTE — Progress Notes (Signed)
Pt came in for OV to be shown how to use trulicity.  Showed pt how to administer the trulicity with our demonstrator pen.  Pt chose to wait on injecting herself with trulicity sample at the time.  Pt understood the instructions and was advised if any questions she could call us back.

## 2020-11-02 ENCOUNTER — Other Ambulatory Visit: Payer: Self-pay | Admitting: Internal Medicine

## 2020-11-02 DIAGNOSIS — R000 Tachycardia, unspecified: Secondary | ICD-10-CM

## 2020-11-04 ENCOUNTER — Telehealth: Payer: Self-pay

## 2020-11-04 NOTE — Telephone Encounter (Signed)
error 

## 2020-11-07 ENCOUNTER — Ambulatory Visit: Payer: 59 | Admitting: Nurse Practitioner

## 2020-11-14 ENCOUNTER — Other Ambulatory Visit: Payer: Self-pay

## 2020-11-14 MED ORDER — TRULICITY 1.5 MG/0.5ML ~~LOC~~ SOAJ: 1.5000 mg | SUBCUTANEOUS | 3 refills | Status: DC

## 2020-11-21 ENCOUNTER — Ambulatory Visit: Payer: 59 | Admitting: Nurse Practitioner

## 2020-12-02 ENCOUNTER — Other Ambulatory Visit: Payer: Self-pay

## 2020-12-02 ENCOUNTER — Encounter: Payer: Self-pay | Admitting: Internal Medicine

## 2020-12-02 ENCOUNTER — Ambulatory Visit: Payer: 59 | Admitting: Internal Medicine

## 2020-12-02 VITALS — BP 120/90 | HR 93 | Temp 97.4°F | Resp 16 | Ht 62.0 in | Wt 260.0 lb

## 2020-12-02 DIAGNOSIS — R7301 Impaired fasting glucose: Secondary | ICD-10-CM

## 2020-12-02 DIAGNOSIS — F445 Conversion disorder with seizures or convulsions: Secondary | ICD-10-CM | POA: Diagnosis not present

## 2020-12-02 DIAGNOSIS — H6123 Impacted cerumen, bilateral: Secondary | ICD-10-CM

## 2020-12-02 DIAGNOSIS — Z6841 Body Mass Index (BMI) 40.0 and over, adult: Secondary | ICD-10-CM

## 2020-12-02 MED ORDER — DEBROX 6.5 % OT SOLN: 5.0000 [drp] | Freq: Two times a day (BID) | OTIC | 0 refills | Status: DC

## 2020-12-02 MED ORDER — SEMAGLUTIDE (1 MG/DOSE) 4 MG/3ML ~~LOC~~ SOPN: 1.0000 mg | PEN_INJECTOR | SUBCUTANEOUS | 12 refills | Status: DC

## 2020-12-02 NOTE — Progress Notes (Signed)
[facility] [place], [place] [address]  Internal MEDICINE  Office Visit Note  Patient Name: [name]  [id number]  [id number]  Date of Service: 12/08/2020  Chief Complaint  Patient presents with   Follow-up    Ears hurt off and on, started about a week or 2 ago   Gastroesophageal Reflux   Sleep Apnea   Hypertension   Asthma   Anemia   Quality Metric Gaps    Shingrix, mammogram, covid booster    HPI Patient is here for routine follow-up her new complaint today is complaining of ears hurting and feels plugged up. Patient has been taking Rybelsus with good results however the insurance company is not covering and will like to try something else she has lost 4 pounds since last visit.  She was given samples of Trulicity on her previous visit and would like to know if for any other medication in that class will be covered She has not had any seizure related events. Blood pressure is under good control    Current Medication: Outpatient Encounter Medications as of 12/02/2020  Medication Sig   albuterol (PROVENTIL HFA;VENTOLIN HFA) 108 (90 BASE) MCG/ACT inhaler Inhale 2 puffs into the lungs every 6 (six) hours as needed for wheezing or shortness of breath.   ARIPiprazole (ABILIFY) 15 MG tablet Take 15 mg by mouth daily. Take 1 tablet by mouth at night   budesonide-formoterol (SYMBICORT) 160-4.5 MCG/ACT inhaler Inhale 2 puffs into the lungs every 12 (twelve) hours.   carbamide peroxide (DEBROX) 6.5 % OTIC solution Place 5 drops into both ears 2 (two) times daily.   clonazePAM (KLONOPIN) 0.5 MG tablet Take 0.5 mg by mouth 2 (two) times daily as needed for anxiety. Take 1/2 tablet by mouth twice a day   gabapentin (NEURONTIN) 300 MG capsule Take 300 mg by mouth daily. Take 1 to 2 capsules by mouth nightly for sleep   ipratropium-albuterol (DUONEB) 0.5-2.5 (3) MG/3ML SOLN USE 1 AMPULE IN NEBULIZER EVERY 4 HOURS AS NEEDED   lamoTRIgine (LAMICTAL) 25 MG tablet  Take 25 mg by mouth. Take 1 tablet in the morning and 1 tablet at night   metoprolol succinate (TOPROL-XL) 50 MG 24 hr tablet TAKE 1 & 1/2 (ONE & ONE-HALF) TABLETS BY MOUTH ONCE DAILY   Na Sulfate-K Sulfate-Mg Sulf (SUPREP BOWEL PREP KIT) 17.5-3.13-1.6 GM/177ML SOLN Take 1 kit by mouth as directed.   nortriptyline (PAMELOR) 10 MG capsule Take 10 mg by mouth. Take 1 tablet po  in the morning   nortriptyline (PAMELOR) 50 MG capsule Take 50 mg by mouth at bedtime. Take 1 capsule by mouth at bedtime   Semaglutide, 1 MG/DOSE, 4 MG/3ML SOPN Inject 1 mg into the skin once a week.   traZODone (DESYREL) 50 MG tablet Take 50 mg by mouth at bedtime. Take 1 to 4 tablets by mouth nightly   triamterene-hydrochlorothiazide (MAXZIDE-25) 37.5-25 MG tablet TAKE 1 TABLET BY MOUTH ONCE DAILY FOR FLUID   [DISCONTINUED] Dulaglutide (TRULICITY) 1.5 XB/3.5HG SOPN Inject 1.5 mg into the skin once a week. Inject 1.5 mg into skin every Sunday (Patient not taking: Reported on 12/02/2020)   [DISCONTINUED] RYBELSUS 7 MG TABS Take 7 mg by mouth daily. (Patient not taking: Reported on 12/02/2020)   No facility-administered encounter medications on file as of 12/02/2020.    Surgical History: Past Surgical History:  Procedure Laterality Date   ABDOMINAL HYSTERECTOMY N/A 06/09/2015   Procedure: HYSTERECTOMY ABDOMINAL with Bilateral Salpingectomy;  Surgeon: Gwen Her  Schermerhorn, MD;  Location: [facility] ORS;  Service: Gynecology;  Laterality: N/A;   CESAREAN SECTION  [id number]   COLONOSCOPY WITH PROPOFOL N/A 10/24/2020   Procedure: COLONOSCOPY WITH PROPOFOL;  Surgeon: [name], MD;  Location: [facility];  Service: Endoscopy;  Laterality: N/A;  sleep apnea   FINGER SURGERY  1992   GASTRIC BYPASS     INGUINAL HERNIA REPAIR Right 2010   REPLACEMENT TOTAL KNEE Right     Medical History: Past Medical History:  Diagnosis Date   Allergy    environmental   Anemia    Asthma    Bronchitis, acute    Complication of anesthesia     HAD ASTHMA ATTACK WHILE UNDER   Gastroesophageal reflux disease without esophagitis 11/02/2017   Hypertension    Seizure ([place])    None this year (2022)   Sleep apnea    CPAP    Family History: Family History  Problem Relation Age of Onset   Diabetes Mother    Hypertension Mother    Cancer Mother    Congestive Heart Failure Father    Diabetes Father     Social History   Socioeconomic History   Marital status: Married    Spouse name: Not on file   Number of children: Not on file   Years of education: Not on file   Highest education level: Not on file  Occupational History   Not on file  Tobacco Use   Smoking status: Never   Smokeless tobacco: Never  Vaping Use   Vaping Use: Never used  Substance and Sexual Activity   Alcohol use: No   Drug use: No   Sexual activity: Yes  Other Topics Concern   Not on file  Social History Narrative   Not on file   Social Determinants of Health   Financial Resource Strain: Not on file  Food Insecurity: Not on file  Transportation Needs: Not on file  Physical Activity: Not on file  Stress: Not on file  Social Connections: Not on file  Intimate Partner Violence: Not on file      Review of Systems  Constitutional:  Negative for chills, fatigue and unexpected weight change.  HENT:  Negative for congestion, postnasal drip, rhinorrhea, sneezing and sore throat.   Eyes:  Negative for redness.  Respiratory:  Negative for cough, chest tightness and shortness of breath.   Cardiovascular:  Negative for chest pain and palpitations.  Gastrointestinal:  Negative for abdominal pain, constipation, diarrhea, nausea and vomiting.  Genitourinary:  Negative for dysuria and frequency.  Musculoskeletal:  Negative for arthralgias, back pain, joint swelling and neck pain.  Skin:  Negative for rash.  Neurological: Negative.  Negative for tremors and numbness.  Hematological:  Negative for adenopathy. Does not bruise/bleed easily.   Psychiatric/Behavioral:  Negative for behavioral problems (Depression), sleep disturbance and suicidal ideas. The patient is not nervous/anxious.    Vital Signs: BP 120/90   Pulse 93   Temp (!) 97.4 F (36.3 C)   Resp 16   Ht 5' 2"  (1.575 m)   Wt 260 lb (117.9 kg)   LMP 05/28/2015   SpO2 97%   BMI 47.55 kg/m    Physical Exam Constitutional:      Appearance: Normal appearance.  HENT:     Head: Normocephalic and atraumatic.     Right Ear: There is impacted cerumen.     Left Ear: There is impacted cerumen.     Nose: Nose normal.     Mouth/Throat:  Mouth: Mucous membranes are moist.     Pharynx: No posterior oropharyngeal erythema.  Eyes:     Extraocular Movements: Extraocular movements intact.     Pupils: Pupils are equal, round, and reactive to light.  Cardiovascular:     Pulses: Normal pulses.     Heart sounds: Normal heart sounds.  Pulmonary:     Effort: Pulmonary effort is normal.     Breath sounds: Normal breath sounds.  Neurological:     General: No focal deficit present.     Mental Status: She is alert.  Psychiatric:        Mood and Affect: Mood normal.        Behavior: Behavior normal.       Assessment/Plan: 1. Impaired fasting glucose Patient is here on samples of Trulicity however the prescription for Ozempic is given to see which medicine will be a better covered for the patient  patient understands that the name of the medications were different but the mechanism of action is the same - Semaglutide, 1 MG/DOSE, 4 MG/3ML SOPN; Inject 1 mg into the skin once a week.  Dispense: 3 mL; Refill: 12  2. BMI 45.0-49.9, adult (HCC) Elevated BMI continue with weight loss management - Semaglutide, 1 MG/DOSE, 4 MG/3ML SOPN; Inject 1 mg into the skin once a week.  Dispense: 3 mL; Refill: 12  3. Bilateral impacted cerumen Bilateral cerumen impaction patient will need to come for bilateral ear irrigation after using Debrox for a week - carbamide peroxide  (DEBROX) 6.5 % OTIC solution; Place 5 drops into both ears 2 (two) times daily.  Dispense: 15 mL; Refill: 0  4.  Followed by neurology psychogenic nonepileptic seizure Followed by neurology  General Counseling: Toshua verbalizes understanding of the findings of todays visit and agrees with plan of treatment. I have discussed any further diagnostic evaluation that may be needed or ordered today. We also reviewed her medications today. she has been encouraged to call the office with any questions or concerns that should arise related to todays visit.    No orders of the defined types were placed in this encounter.   Meds ordered this encounter  Medications   carbamide peroxide (DEBROX) 6.5 % OTIC solution    Sig: Place 5 drops into both ears 2 (two) times daily.    Dispense:  15 mL    Refill:  0   Semaglutide, 1 MG/DOSE, 4 MG/3ML SOPN    Sig: Inject 1 mg into the skin once a week.    Dispense:  3 mL    Refill:  12    Total time spent:40 Minutes Time spent includes review of chart, medications, test results, and follow up plan with the patient.   [place] Controlled Substance Database was reviewed by me.   Dr Lavera Guise Internal medicine

## 2020-12-09 ENCOUNTER — Ambulatory Visit: Payer: 59

## 2020-12-14 ENCOUNTER — Other Ambulatory Visit: Payer: Self-pay | Admitting: Internal Medicine

## 2020-12-14 DIAGNOSIS — R000 Tachycardia, unspecified: Secondary | ICD-10-CM

## 2020-12-17 ENCOUNTER — Ambulatory Visit: Payer: 59

## 2020-12-31 ENCOUNTER — Ambulatory Visit: Payer: 59 | Admitting: Nurse Practitioner

## 2021-01-07 ENCOUNTER — Encounter: Payer: 59 | Admitting: Internal Medicine

## 2021-01-14 ENCOUNTER — Ambulatory Visit: Payer: 59

## 2021-01-21 ENCOUNTER — Ambulatory Visit: Payer: 59

## 2021-01-27 ENCOUNTER — Ambulatory Visit: Payer: 59 | Admitting: Nurse Practitioner

## 2021-02-01 ENCOUNTER — Other Ambulatory Visit: Payer: Self-pay | Admitting: Internal Medicine

## 2021-02-01 DIAGNOSIS — R000 Tachycardia, unspecified: Secondary | ICD-10-CM

## 2021-02-13 ENCOUNTER — Other Ambulatory Visit: Payer: Self-pay | Admitting: Internal Medicine

## 2021-02-13 DIAGNOSIS — Z1231 Encounter for screening mammogram for malignant neoplasm of breast: Secondary | ICD-10-CM

## 2021-02-18 ENCOUNTER — Ambulatory Visit: Payer: 59

## 2021-04-08 ENCOUNTER — Other Ambulatory Visit: Payer: Self-pay

## 2021-04-08 ENCOUNTER — Encounter: Payer: Self-pay | Admitting: Nurse Practitioner

## 2021-04-08 ENCOUNTER — Telehealth: Payer: 59 | Admitting: Nurse Practitioner

## 2021-04-08 VITALS — Ht 62.0 in | Wt 260.0 lb

## 2021-04-08 DIAGNOSIS — J4540 Moderate persistent asthma, uncomplicated: Secondary | ICD-10-CM

## 2021-04-08 DIAGNOSIS — J0110 Acute frontal sinusitis, unspecified: Secondary | ICD-10-CM

## 2021-04-08 DIAGNOSIS — R051 Acute cough: Secondary | ICD-10-CM | POA: Diagnosis not present

## 2021-04-08 MED ORDER — BENZONATATE 100 MG PO CAPS: 100.0000 mg | ORAL_CAPSULE | Freq: Two times a day (BID) | ORAL | 0 refills | Status: DC | PRN

## 2021-04-08 MED ORDER — PREDNISONE 10 MG PO TABS: ORAL_TABLET | ORAL | 0 refills | Status: DC

## 2021-04-08 MED ORDER — AMOXICILLIN-POT CLAVULANATE 875-125 MG PO TABS: 1.0000 | ORAL_TABLET | Freq: Two times a day (BID) | ORAL | 0 refills | Status: AC

## 2021-04-08 NOTE — Progress Notes (Signed)
[facility] [place], [place] [address]  Internal MEDICINE  Telephone Visit  Patient Name: [name]  [id number]  [id number]  Date of Service: 04/08/2021  I connected with the patient at 1:50 PM by telephone and verified the patients identity using two identifiers.   I discussed the limitations, risks, security and privacy concerns of performing an evaluation and management service by telephone and the availability of in person appointments. I also discussed with the patient that there may be a patient responsible charge related to the service.  The patient expressed understanding and agrees to proceed.    Chief Complaint  Patient presents with   Telephone Screen    [id number]    Telephone Assessment   Sinusitis   Cough    Yellow mucus      HPI [name] presents for a telehealth virtual visit for symptoms of sinusitis. She has a cough, SOB, slight wheezing due to her asthma. She also reports sinus pain/pressure, runny nose, nasal congestion, sore throat, fatigue and chills. She took a covid test today and it was negative.    Current Medication: Outpatient Encounter Medications as of 04/08/2021  Medication Sig   albuterol (PROVENTIL HFA;VENTOLIN HFA) 108 (90 BASE) MCG/ACT inhaler Inhale 2 puffs into the lungs every 6 (six) hours as needed for wheezing or shortness of breath.   amoxicillin-clavulanate (AUGMENTIN) 875-125 MG tablet Take 1 tablet by mouth 2 (two) times daily for 7 days.   ARIPiprazole (ABILIFY) 15 MG tablet Take 15 mg by mouth daily. Take 1 tablet by mouth at night   benzonatate (TESSALON) 100 MG capsule Take 1 capsule (100 mg total) by mouth 2 (two) times daily as needed for cough.   budesonide-formoterol (SYMBICORT) 160-4.5 MCG/ACT inhaler Inhale 2 puffs into the lungs every 12 (twelve) hours.   carbamide peroxide (DEBROX) 6.5 % OTIC solution Place 5 drops into both ears 2 (two) times daily.   clonazePAM (KLONOPIN) 0.5 MG tablet Take  0.5 mg by mouth 2 (two) times daily as needed for anxiety. Take 1/2 tablet by mouth twice a day   gabapentin (NEURONTIN) 300 MG capsule Take 300 mg by mouth daily. Take 1 to 2 capsules by mouth nightly for sleep   ipratropium-albuterol (DUONEB) 0.5-2.5 (3) MG/3ML SOLN USE 1 AMPULE IN NEBULIZER EVERY 4 HOURS AS NEEDED   lamoTRIgine (LAMICTAL) 25 MG tablet Take 25 mg by mouth. Take 1 tablet in the morning and 1 tablet at night   metoprolol succinate (TOPROL-XL) 50 MG 24 hr tablet TAKE 1 & 1/2 TABLETS BY MOUTH ONCE DAILY   Na Sulfate-K Sulfate-Mg Sulf (SUPREP BOWEL PREP KIT) 17.5-3.13-1.6 GM/177ML SOLN Take 1 kit by mouth as directed.   nortriptyline (PAMELOR) 10 MG capsule Take 10 mg by mouth. Take 1 tablet po  in the morning   nortriptyline (PAMELOR) 50 MG capsule Take 50 mg by mouth at bedtime. Take 1 capsule by mouth at bedtime   predniSONE (DELTASONE) 10 MG tablet Take one tab 3 x day for 3 days, then take one tab 2 x a day for 3 days and then take one tab a day for 3 days for asthma   Semaglutide, 1 MG/DOSE, 4 MG/3ML SOPN Inject 1 mg into the skin once a week.   traZODone (DESYREL) 50 MG tablet Take 50 mg by mouth at bedtime. Take 1 to 4 tablets by mouth nightly   triamterene-hydrochlorothiazide (MAXZIDE-25) 37.5-25 MG tablet TAKE 1 TABLET BY MOUTH ONCE DAILY FOR FLUID   No  facility-administered encounter medications on file as of 04/08/2021.    Surgical History: Past Surgical History:  Procedure Laterality Date   ABDOMINAL HYSTERECTOMY N/A 06/09/2015   Procedure: HYSTERECTOMY ABDOMINAL with Bilateral Salpingectomy;  Surgeon: Boykin Nearing, MD;  Location: [facility];  Service: Gynecology;  Laterality: N/A;   CESAREAN SECTION  [id number]   COLONOSCOPY WITH PROPOFOL N/A 10/24/2020   Procedure: COLONOSCOPY WITH PROPOFOL;  Surgeon: [name], MD;  Location: [facility];  Service: Endoscopy;  Laterality: N/A;  sleep apnea   FINGER SURGERY  1992   GASTRIC BYPASS     INGUINAL HERNIA  REPAIR Right 2010   REPLACEMENT TOTAL KNEE Right     Medical History: Past Medical History:  Diagnosis Date   Allergy    environmental   Anemia    Asthma    Bronchitis, acute    Complication of anesthesia    HAD ASTHMA ATTACK WHILE UNDER   Gastroesophageal reflux disease without esophagitis 11/02/2017   Hypertension    Seizure ([place])    None this year (2022)   Sleep apnea    CPAP    Family History: Family History  Problem Relation Age of Onset   Diabetes Mother    Hypertension Mother    Cancer Mother    Congestive Heart Failure Father    Diabetes Father     Social History   Socioeconomic History   Marital status: Married    Spouse name: Not on file   Number of children: Not on file   Years of education: Not on file   Highest education level: Not on file  Occupational History   Not on file  Tobacco Use   Smoking status: Never   Smokeless tobacco: Never  Vaping Use   Vaping Use: Never used  Substance and Sexual Activity   Alcohol use: No   Drug use: No   Sexual activity: Yes  Other Topics Concern   Not on file  Social History Narrative   Not on file   Social Determinants of Health   Financial Resource Strain: Not on file  Food Insecurity: Not on file  Transportation Needs: Not on file  Physical Activity: Not on file  Stress: Not on file  Social Connections: Not on file  Intimate Partner Violence: Not on file      Review of Systems  Constitutional:  Positive for chills and fatigue. Negative for fever.  HENT:  Positive for congestion, postnasal drip, rhinorrhea, sinus pressure, sinus pain, sneezing and sore throat. Negative for ear pain and trouble swallowing.   Eyes: Negative.  Negative for pain.  Respiratory:  Positive for cough, shortness of breath and wheezing. Negative for chest tightness.   Cardiovascular: Negative.  Negative for chest pain and palpitations.  Gastrointestinal: Negative.  Negative for abdominal pain, constipation, diarrhea,  nausea and vomiting.  Musculoskeletal: Negative.  Negative for myalgias.  Skin:  Negative for rash.  Neurological:  Positive for headaches.   Vital Signs: Ht 5' 2" (1.575 m)   Wt 260 lb (117.9 kg)   LMP 05/28/2015   BMI 47.55 kg/m    Observation/Objective: She is alert and oriented and engages in conversation appropriately. She does not appear to be in any acute distress over video call.     Assessment/Plan: 1. Acute non-recurrent frontal sinusitis Empiric antibiotic treatment prescribed, patient was negative for covid - amoxicillin-clavulanate (AUGMENTIN) 875-125 MG tablet; Take 1 tablet by mouth 2 (two) times daily for 7 days.  Dispense: 14 tablet; Refill: 0  2. Moderate persistent asthma without complication Patient has asthma, the sinusitis is flaring up her asthma with wheezing and SOB, prednisone course prescribed to decrease inflammation - predniSONE (DELTASONE) 10 MG tablet; Take one tab 3 x day for 3 days, then take one tab 2 x a day for 3 days and then take one tab a day for 3 days for asthma  Dispense: 18 tablet; Refill: 0  3. Acute cough Symptomatic treatment of cough with benzonatate. - benzonatate (TESSALON) 100 MG capsule; Take 1 capsule (100 mg total) by mouth 2 (two) times daily as needed for cough.  Dispense: 30 capsule; Refill: 0   General Counseling: Jamita verbalizes understanding of the findings of today's phone visit and agrees with plan of treatment. I have discussed any further diagnostic evaluation that may be needed or ordered today. We also reviewed her medications today. she has been encouraged to call the office with any questions or concerns that should arise related to todays visit.  Return if symptoms worsen or fail to improve.   No orders of the defined types were placed in this encounter.   Meds ordered this encounter  Medications   amoxicillin-clavulanate (AUGMENTIN) 875-125 MG tablet    Sig: Take 1 tablet by mouth 2 (two) times daily  for 7 days.    Dispense:  14 tablet    Refill:  0   predniSONE (DELTASONE) 10 MG tablet    Sig: Take one tab 3 x day for 3 days, then take one tab 2 x a day for 3 days and then take one tab a day for 3 days for asthma    Dispense:  18 tablet    Refill:  0   benzonatate (TESSALON) 100 MG capsule    Sig: Take 1 capsule (100 mg total) by mouth 2 (two) times daily as needed for cough.    Dispense:  30 capsule    Refill:  0    Time spent:10 Minutes Time spent with patient included reviewing progress notes, labs, imaging studies, and discussing plan for follow up.   [place] Controlled Substance Database was reviewed by me for overdose risk score (ORS) if appropriate.  This patient was seen by Jonetta Osgood, FNP-C in collaboration with Dr. Clayborn Bigness as a part of collaborative care agreement.  Kearia Yin R. Valetta Fuller, MSN, FNP-C Internal medicine

## 2021-04-12 ENCOUNTER — Other Ambulatory Visit: Payer: Self-pay | Admitting: Internal Medicine

## 2021-04-12 DIAGNOSIS — I10 Essential (primary) hypertension: Secondary | ICD-10-CM

## 2021-07-05 ENCOUNTER — Other Ambulatory Visit: Payer: Self-pay | Admitting: Nurse Practitioner

## 2021-07-05 DIAGNOSIS — I10 Essential (primary) hypertension: Secondary | ICD-10-CM

## 2021-08-01 ENCOUNTER — Emergency Department: Payer: 59

## 2021-08-01 ENCOUNTER — Emergency Department
Admission: EM | Admit: 2021-08-01 | Discharge: 2021-08-01 | Disposition: A | Discharge status: Home / Self Care | Payer: 59 | Attending: Emergency Medicine | Admitting: Emergency Medicine

## 2021-08-01 DIAGNOSIS — J45909 Unspecified asthma, uncomplicated: Secondary | ICD-10-CM | POA: Diagnosis not present

## 2021-08-01 DIAGNOSIS — Z96651 Presence of right artificial knee joint: Secondary | ICD-10-CM | POA: Diagnosis not present

## 2021-08-01 DIAGNOSIS — X500XXA Overexertion from strenuous movement or load, initial encounter: Secondary | ICD-10-CM | POA: Insufficient documentation

## 2021-08-01 DIAGNOSIS — I10 Essential (primary) hypertension: Secondary | ICD-10-CM | POA: Diagnosis not present

## 2021-08-01 DIAGNOSIS — M25562 Pain in left knee: Secondary | ICD-10-CM | POA: Insufficient documentation

## 2021-08-01 DIAGNOSIS — M1990 Unspecified osteoarthritis, unspecified site: Secondary | ICD-10-CM

## 2021-08-01 MED ORDER — DICLOFENAC SODIUM 1.5 % EX SOLN: 1.0000 | Freq: Two times a day (BID) | CUTANEOUS | 0 refills | Status: AC | PRN

## 2021-08-01 NOTE — ED Provider Notes (Signed)
? ?[facility] ?Provider Note ? ? ? Event Date/Time  ? First MD Initiated Contact with Patient 08/01/21 [phone]   ?  (approximate) ? ? ?History  ? ?Knee Pain ? ? ?HPI ? ?[name] is a 51 y.o. female with a past medical history of seizure disorder, HTN, asthma, anemia, OSA on CPAP and fairly significant osteoarthritis status post total right knee replacement several years ago who presents for evaluation of some left knee pain.  Patient states started about a week ago when she was helping her daughter move and did some heavy lifting.  She states that has been aggravating her since then.  No subsequent injuries.  She has been taking Tylenol but does not feel this is helped much.  She has no pain in her ankle or the hip.  Denies any other symptoms including fevers, chills, rash, vomiting, diarrhea, headache area, sore throat or any other extremity pain.  No numbness.  She states that sometimes will give out from under her feet like it is clicking.  No significant swelling she has noticed. ? ? ?Past Medical History:  ?Diagnosis Date  ? Allergy   ? environmental  ? Anemia   ? Asthma   ? Bronchitis, acute   ? Complication of anesthesia   ? HAD ASTHMA ATTACK WHILE UNDER  ? Gastroesophageal reflux disease without esophagitis 11/02/2017  ? Hypertension   ? Seizure ([facility])   ? None this year (2022)  ? Sleep apnea   ? CPAP  ? ? ? ?  ? ? ?Physical Exam  ?Triage Vital Signs: ?ED Triage Vitals  ?Enc Vitals Group  ?   BP 08/01/21 0825 122/83  ?   Pulse Rate 08/01/21 0825 94  ?   Resp 08/01/21 0825 18  ?   Temp 08/01/21 0825 98.6 ?F (37 ?C)  ?   Temp Source 08/01/21 0825 Oral  ?   SpO2 08/01/21 0825 98 %  ?   Weight 08/01/21 0824 254 lb (115.2 kg)  ?   Height 08/01/21 0824 5\' 2"  (1.575 m)  ?   Head Circumference --   ?   Peak Flow --   ?   Pain Score 08/01/21 0825 8  ?   Pain Loc --   ?   Pain Edu? --   ?   Excl. in GC? --   ? ? ?Most recent vital signs: ?Vitals:  ? 08/01/21 0825  ?BP: 122/83  ?Pulse: 94   ?Resp: 18  ?Temp: 98.6 ?F (37 ?C)  ?SpO2: 98%  ? ? ?General: Awake, no distress.  ?CV:  Good peripheral perfusion.  2+ DP pulses. ?Resp:  Normal effort.  ?Abd:  No distention.  ?Other:  Sensation is intact to light touch throughout the bilateral lower extremities.  Patient has some mild tenderness over the medial collateral ligaments and patella without any deformity large effusion or overlying skin changes.  No significant posterior tenderness patient has full range of motion compared to the right.  She has no pain on range of motion and has full strength at the left ankle and hip compared to the right.   ? ? ?ED Results / Procedures / Treatments  ?Labs ?(all labs ordered are listed, but only abnormal results are displayed) ?Labs Reviewed - No data to display ? ? ?EKG ? ? ?RADIOLOGY ?X-ray of the left knee on my interpretation without fracture dislocation.  I also reviewed radiology's interpretation and agree with the findings of same in addition to  fairly significant osteoarthritis findings and probable endochondroma. ? ? ?PROCEDURES: ? ?Critical Care performed: No ? ?Procedures ? ? ? ?MEDICATIONS ORDERED IN ED: ?Medications - No data to display ? ? ?IMPRESSION / MDM / ASSESSMENT AND PLAN / ED COURSE  ?I reviewed the triage vital signs and the nursing notes. ?             ?               ? ?Differential diagnosis includes, but is not limited to contusion, ligamentous versus meniscal injury with lower suspicion at this time based on patient's history, exam and x-ray for fracture, dislocation, DVT, septic joint, compartment syndrome or gout.  It is possible patient ruptured a Baker's cyst only do not believe this would change management to obtain ultrasound at this time.  Discussed incidental finding of endochondroma.  Patient has been able to ambulate without difficulty and is otherwise neurovascular intact.  We will add Voltaren gel to her pain regimen to see if this helps and have her follow-up with orthopedic  service.  Discharged in stable condition.  Strict return precautions advised and discussed. ? ?  ? ? ?FINAL CLINICAL IMPRESSION(S) / ED DIAGNOSES  ? ?Final diagnoses:  ?Acute pain of left knee  ?Arthritis  ? ? ? ?Rx / DC Orders  ? ?ED Discharge Orders   ? ?      Ordered  ?  Diclofenac Sodium 1.5 % SOLN  2 times daily PRN       ? 08/01/21 0910  ? ?  ?  ? ?  ? ? ? ?Note:  This document was prepared using Dragon voice recognition software and may include unintentional dictation errors. ?  ?Gilles Chiquito, MD ?08/01/21 0932 ? ?

## 2021-08-01 NOTE — Discharge Instructions (Addendum)
1. Four views of the left knee demonstrate no acute osseous ?abnormality. ?2. Tricompartmental osteoarthritis, most severe in the medial and ?patellofemoral compartments. ?3. 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 ?

## 2021-08-01 NOTE — ED Triage Notes (Signed)
Pt comes pov for left knee pain after helping daughter move last weekend ?

## 2021-08-01 NOTE — ED Provider Triage Note (Signed)
Emergency Medicine Provider Triage Evaluation Note ? ?Uvaldo Bristle , a 51 y.o. female  was evaluated in triage.  Pt complains of left leg pain since last weekend. Using FlexAll otc without relief.   ? ?Review of Systems  ?Positive: Left leg and thigh pain.  ?Negative: Negative back pain unless standing long time ? ?Physical Exam  ?LMP 05/28/2015  ?Gen:   Awake, no distress   ?Resp:  Normal effort  ?MSK:   Moves extremities without difficulty.  Tender medical aspect left knee. No redness or warmth.  Skin intact.  No edema present ? ? ?Medical Decision Making  ?Medically screening exam initiated at 8:23 AM.  Appropriate orders placed.  Laqueta E Gardiner was informed that the remainder of the evaluation will be completed by another provider, this initial triage assessment does not replace that evaluation, and the importance of remaining in the ED until their evaluation is complete. ? ? ?  ?Tommi Rumps, PA-C ?08/01/21 [phone] ? ?

## 2021-08-01 NOTE — ED Notes (Signed)
E-signature not working at this time. Pt verbalized understanding of D/C instructions, prescriptions and follow up care with no further questions at this time. Pt in NAD and ambulatory at time of D/C.  

## 2021-08-02 ENCOUNTER — Other Ambulatory Visit: Payer: Self-pay | Admitting: Internal Medicine

## 2021-08-02 DIAGNOSIS — R000 Tachycardia, unspecified: Secondary | ICD-10-CM

## 2021-10-04 ENCOUNTER — Other Ambulatory Visit: Payer: Self-pay | Admitting: Nurse Practitioner

## 2021-10-04 DIAGNOSIS — I10 Essential (primary) hypertension: Secondary | ICD-10-CM

## 2021-10-09 ENCOUNTER — Encounter: Payer: 59 | Admitting: Nurse Practitioner

## 2021-10-16 ENCOUNTER — Other Ambulatory Visit: Payer: Self-pay | Admitting: Physician Assistant

## 2021-10-16 ENCOUNTER — Telehealth (INDEPENDENT_AMBULATORY_CARE_PROVIDER_SITE_OTHER): Payer: 59 | Admitting: Physician Assistant

## 2021-10-16 ENCOUNTER — Other Ambulatory Visit: Payer: Self-pay

## 2021-10-16 DIAGNOSIS — J4521 Mild intermittent asthma with (acute) exacerbation: Secondary | ICD-10-CM

## 2021-10-16 DIAGNOSIS — R051 Acute cough: Secondary | ICD-10-CM

## 2021-10-16 MED ORDER — IPRATROPIUM-ALBUTEROL 0.5-2.5 (3) MG/3ML IN SOLN: RESPIRATORY_TRACT | 0 refills | Status: DC

## 2021-10-16 MED ORDER — AZITHROMYCIN 250 MG PO TABS: ORAL_TABLET | ORAL | 0 refills | Status: DC

## 2021-10-16 MED ORDER — IPRATROPIUM BROMIDE 0.02 % IN SOLN: 0.5000 mg | Freq: Four times a day (QID) | RESPIRATORY_TRACT | 0 refills | Status: DC

## 2021-10-16 MED ORDER — ALBUTEROL SULFATE (2.5 MG/3ML) 0.083% IN NEBU: 2.5000 mg | INHALATION_SOLUTION | RESPIRATORY_TRACT | 0 refills | Status: DC | PRN

## 2021-10-16 MED ORDER — BENZONATATE 100 MG PO CAPS: 100.0000 mg | ORAL_CAPSULE | Freq: Two times a day (BID) | ORAL | 0 refills | Status: DC | PRN

## 2021-10-16 NOTE — Progress Notes (Signed)
[facility] [place], [place] [address]  Internal MEDICINE  Telephone Visit  Patient Name: [name]  [id number]  [id number]  Date of Service: 10/27/2021  I connected with the patient at 11:04 by telephone and verified the patients identity using two identifiers.   I discussed the limitations, risks, security and privacy concerns of performing an evaluation and management service by telephone and the availability of in person appointments. I also discussed with the patient that there may be a patient responsible charge related to the service.  The patient expressed understanding and agrees to proceed.    Chief Complaint  Patient presents with   Telephone Assessment    [id number]   Telephone Screen   Sinusitis   Cough    Yellow mucus     HPI Pt is here for virtual sick visit -She has been experiencing cough, sinus congestion with yellow mucus since Tuesday -wheezing and asthma flare started more so on Wednesday with cough progression yesterday. No SOB -albuterol neb doesn't open her up--ran out of duoneb and needs this refilled -using her inhalers as prescribed  Current Medication: Outpatient Encounter Medications as of 10/16/2021  Medication Sig   albuterol (PROVENTIL HFA;VENTOLIN HFA) 108 (90 BASE) MCG/ACT inhaler Inhale 2 puffs into the lungs every 6 (six) hours as needed for wheezing or shortness of breath.   ARIPiprazole (ABILIFY) 15 MG tablet Take 15 mg by mouth daily. Take 1 tablet by mouth at night   budesonide-formoterol (SYMBICORT) 160-4.5 MCG/ACT inhaler Inhale 2 puffs into the lungs every 12 (twelve) hours.   carbamide peroxide (DEBROX) 6.5 % OTIC solution Place 5 drops into both ears 2 (two) times daily.   clonazePAM (KLONOPIN) 0.5 MG tablet Take 0.5 mg by mouth 2 (two) times daily as needed for anxiety. Take 1/2 tablet by mouth twice a day   gabapentin (NEURONTIN) 300 MG capsule Take 300 mg by mouth daily. Take 1 to 2 capsules by mouth  nightly for sleep   lamoTRIgine (LAMICTAL) 25 MG tablet Take 25 mg by mouth. Take 1 tablet in the morning and 1 tablet at night   metoprolol succinate (TOPROL-XL) 50 MG 24 hr tablet TAKE 1 & 1/2 (ONE & ONE-HALF) TABLETS BY MOUTH ONCE DAILY   Na Sulfate-K Sulfate-Mg Sulf (SUPREP BOWEL PREP KIT) 17.5-3.13-1.6 GM/177ML SOLN Take 1 kit by mouth as directed.   nortriptyline (PAMELOR) 10 MG capsule Take 10 mg by mouth. Take 1 tablet po  in the morning   nortriptyline (PAMELOR) 50 MG capsule Take 50 mg by mouth at bedtime. Take 1 capsule by mouth at bedtime   Semaglutide, 1 MG/DOSE, 4 MG/3ML SOPN Inject 1 mg into the skin once a week.   traZODone (DESYREL) 50 MG tablet Take 50 mg by mouth at bedtime. Take 1 to 4 tablets by mouth nightly   triamterene-hydrochlorothiazide (MAXZIDE-25) 37.5-25 MG tablet TAKE 1 TABLET BY MOUTH ONCE DAILY FOR FLUID   [DISCONTINUED] azithromycin (ZITHROMAX) 250 MG tablet Take 2 tablets on day 1, then 1 tablet daily on days 2 through 5 (Patient not taking: Reported on 10/21/2021)   [DISCONTINUED] benzonatate (TESSALON) 100 MG capsule Take 1 capsule (100 mg total) by mouth 2 (two) times daily as needed for cough.   [DISCONTINUED] ipratropium-albuterol (DUONEB) 0.5-2.5 (3) MG/3ML SOLN USE 1 AMPULE IN NEBULIZER EVERY 4 HOURS AS NEEDED   [DISCONTINUED] predniSONE (DELTASONE) 10 MG tablet Take one tab 3 x day for 3 days, then take one tab 2 x a day for  3 days and then take one tab a day for 3 days for asthma   [DISCONTINUED] benzonatate (TESSALON) 100 MG capsule Take 1 capsule (100 mg total) by mouth 2 (two) times daily as needed for cough. (Patient taking differently: Take 200 mg by mouth 2 (two) times daily as needed for cough.)   [DISCONTINUED] ipratropium-albuterol (DUONEB) 0.5-2.5 (3) MG/3ML SOLN USE 1 AMPULE IN NEBULIZER EVERY 4 HOURS AS NEEDED   No facility-administered encounter medications on file as of 10/16/2021.    Surgical History: Past Surgical History:  Procedure  Laterality Date   ABDOMINAL HYSTERECTOMY N/A 06/09/2015   Procedure: HYSTERECTOMY ABDOMINAL with Bilateral Salpingectomy;  Surgeon: Boykin Nearing, MD;  Location: [facility];  Service: Gynecology;  Laterality: N/A;   CESAREAN SECTION  [id number]   COLONOSCOPY WITH PROPOFOL N/A 10/24/2020   Procedure: COLONOSCOPY WITH PROPOFOL;  Surgeon: [name], MD;  Location: [facility];  Service: Endoscopy;  Laterality: N/A;  sleep apnea   FINGER SURGERY  1992   GASTRIC BYPASS     INGUINAL HERNIA REPAIR Right 2010   REPLACEMENT TOTAL KNEE Right     Medical History: Past Medical History:  Diagnosis Date   Allergy    environmental   Anemia    Asthma    Bronchitis, acute    Complication of anesthesia    HAD ASTHMA ATTACK WHILE UNDER   Gastroesophageal reflux disease without esophagitis 11/02/2017   Hypertension    Seizure ([place])    None this year (2022)   Sleep apnea    CPAP    Family History: Family History  Problem Relation Age of Onset   Diabetes Mother    Hypertension Mother    Cancer Mother    Congestive Heart Failure Father    Diabetes Father     Social History   Socioeconomic History   Marital status: Married    Spouse name: Not on file   Number of children: Not on file   Years of education: Not on file   Highest education level: Not on file  Occupational History   Not on file  Tobacco Use   Smoking status: Never   Smokeless tobacco: Never  Vaping Use   Vaping Use: Never used  Substance and Sexual Activity   Alcohol use: No   Drug use: No   Sexual activity: Yes  Other Topics Concern   Not on file  Social History Narrative   Not on file   Social Determinants of Health   Financial Resource Strain: Not on file  Food Insecurity: Not on file  Transportation Needs: Not on file  Physical Activity: Not on file  Stress: Not on file  Social Connections: Not on file  Intimate Partner Violence: Not on file      Review of Systems  Constitutional:   Negative for fatigue and fever.  HENT:  Positive for congestion and postnasal drip. Negative for mouth sores.   Respiratory:  Positive for cough and wheezing. Negative for shortness of breath.   Cardiovascular:  Negative for chest pain.  Genitourinary:  Negative for flank pain.  Psychiatric/Behavioral: Negative.     Vital Signs: Temp (!) 97.3 F (36.3 C)   Ht _0  (1.575 m)   Wt 249 lb (112.9 kg)   LMP 05/28/2015   BMI 45.54 kg/m    Observation/Objective:  Pt is able to carry out conversation   Assessment/Plan: 1. Mild intermittent asthma with acute exacerbation in adult Will start on zpak and tessalon as needed for  cough. May continue inhalers as prescribed and will send nebulizer refills. If not improving pt will contact office and prednisone may be added  2. Acute cough May take tessalon as needed for cough   General Counseling: Cuca verbalizes understanding of the findings of today's phone visit and agrees with plan of treatment. I have discussed any further diagnostic evaluation that may be needed or ordered today. We also reviewed her medications today. she has been encouraged to call the office with any questions or concerns that should arise related to todays visit.    No orders of the defined types were placed in this encounter.   Meds ordered this encounter  Medications   DISCONTD: ipratropium-albuterol (DUONEB) 0.5-2.5 (3) MG/3ML SOLN    Sig: USE 1 AMPULE IN NEBULIZER EVERY 4 HOURS AS NEEDED    Dispense:  360 mL    Refill:  0   DISCONTD: azithromycin (ZITHROMAX) 250 MG tablet    Sig: Take 2 tablets on day 1, then 1 tablet daily on days 2 through 5    Dispense:  6 tablet    Refill:  0   DISCONTD: benzonatate (TESSALON) 100 MG capsule    Sig: Take 1 capsule (100 mg total) by mouth 2 (two) times daily as needed for cough.    Dispense:  30 capsule    Refill:  0    Time spent:25 Minutes    Dr Lavera Guise Internal medicine

## 2021-10-19 ENCOUNTER — Other Ambulatory Visit: Payer: Self-pay

## 2021-10-19 ENCOUNTER — Telehealth: Payer: Self-pay

## 2021-10-19 DIAGNOSIS — R051 Acute cough: Secondary | ICD-10-CM

## 2021-10-19 MED ORDER — BENZONATATE 100 MG PO CAPS: 200.0000 mg | ORAL_CAPSULE | Freq: Two times a day (BID) | ORAL | 0 refills | Status: DC | PRN

## 2021-10-19 MED ORDER — PREDNISONE 10 MG PO TABS: ORAL_TABLET | ORAL | 0 refills | Status: DC

## 2021-10-19 NOTE — Telephone Encounter (Signed)
Pt called that coughing and wheezing and her covid test is negative as per lauren send benzonatate 200 mg and prednisone and advised I make her follow up appt on wednesday

## 2021-10-21 ENCOUNTER — Encounter: Payer: Self-pay | Admitting: Nurse Practitioner

## 2021-10-21 ENCOUNTER — Ambulatory Visit
Admission: RE | Admit: 2021-10-21 | Discharge: 2021-10-21 | Disposition: A | Discharge status: Home / Self Care | Payer: 59 | Attending: Nurse Practitioner | Admitting: Nurse Practitioner

## 2021-10-21 ENCOUNTER — Ambulatory Visit (INDEPENDENT_AMBULATORY_CARE_PROVIDER_SITE_OTHER): Payer: 59 | Admitting: Nurse Practitioner

## 2021-10-21 ENCOUNTER — Ambulatory Visit
Admission: RE | Admit: 2021-10-21 | Discharge: 2021-10-21 | Disposition: A | Discharge status: Home / Self Care | Payer: 59 | Source: Ambulatory Visit | Attending: Nurse Practitioner | Admitting: Nurse Practitioner

## 2021-10-21 VITALS — BP 112/77 | HR 88 | Temp 98.5°F | Resp 16 | Ht 62.0 in | Wt 248.6 lb

## 2021-10-21 DIAGNOSIS — J22 Unspecified acute lower respiratory infection: Secondary | ICD-10-CM | POA: Diagnosis present

## 2021-10-21 DIAGNOSIS — J0110 Acute frontal sinusitis, unspecified: Secondary | ICD-10-CM | POA: Diagnosis not present

## 2021-10-21 DIAGNOSIS — R062 Wheezing: Secondary | ICD-10-CM | POA: Insufficient documentation

## 2021-10-21 DIAGNOSIS — R0989 Other specified symptoms and signs involving the circulatory and respiratory systems: Secondary | ICD-10-CM

## 2021-10-21 DIAGNOSIS — R051 Acute cough: Secondary | ICD-10-CM | POA: Diagnosis not present

## 2021-10-21 DIAGNOSIS — B3731 Acute candidiasis of vulva and vagina: Secondary | ICD-10-CM

## 2021-10-21 MED ORDER — HYDROCOD POLI-CHLORPHE POLI ER 10-8 MG/5ML PO SUER: 5.0000 mL | Freq: Two times a day (BID) | ORAL | 0 refills | Status: DC | PRN

## 2021-10-21 MED ORDER — AMOXICILLIN-POT CLAVULANATE 875-125 MG PO TABS: 1.0000 | ORAL_TABLET | Freq: Two times a day (BID) | ORAL | 0 refills | Status: AC

## 2021-10-21 MED ORDER — FLUCONAZOLE 150 MG PO TABS: 150.0000 mg | ORAL_TABLET | Freq: Once | ORAL | 0 refills | Status: AC

## 2021-10-21 NOTE — Progress Notes (Signed)
[facility] [place], [place] [address]  Internal MEDICINE  Office Visit Note  Patient Name: [name]  [id number]  [id number]  Date of Service: 10/21/2021  Chief Complaint  Patient presents with   Acute Visit    Neg. Covid test   Cough    Wheezing      HPI [name] presents for an acute sick visit for possible upper respiratory infection with a negative COVID test.  [name] reports wheezing and cough that keeps [name] up at night.  Additional symptoms that [name] confirms are nasal congestion, fatigue, chills, runny nose, sinus pain and pressure, sore throat, shortness of breath, headaches, and sneezing.  Symptoms started approximately 3 days ago     Current Medication:  Outpatient Encounter Medications as of 10/21/2021  Medication Sig   [EXPIRED] amoxicillin-clavulanate (AUGMENTIN) 875-125 MG tablet Take 1 tablet by mouth 2 (two) times daily for 14 days. Take with food.   chlorpheniramine-HYDROcodone (TUSSIONEX PENNKINETIC ER) 10-8 MG/5ML Take 5 mLs by mouth every 12 (twelve) hours as needed for cough.   [EXPIRED] fluconazole (DIFLUCAN) 150 MG tablet Take 1 tablet (150 mg total) by mouth once for 1 dose. May take an additional dose after 3 days if still symptomatic.   albuterol (PROVENTIL HFA;VENTOLIN HFA) 108 (90 BASE) MCG/ACT inhaler Inhale 2 puffs into the lungs every 6 (six) hours as needed for wheezing or shortness of breath.   albuterol (PROVENTIL) (2.5 MG/3ML) 0.083% nebulizer solution Take 3 mLs (2.5 mg total) by nebulization every 4 (four) hours as needed for wheezing or shortness of breath. Mixed with ipratropium due to duoneb back order   ARIPiprazole (ABILIFY) 15 MG tablet Take 15 mg by mouth daily. Take 1 tablet by mouth at night   benzonatate (TESSALON) 100 MG capsule Take 2 capsules (200 mg total) by mouth 2 (two) times daily as needed for cough.   budesonide-formoterol (SYMBICORT) 160-4.5 MCG/ACT inhaler Inhale 2 puffs into the lungs every 12  (twelve) hours.   carbamide peroxide (DEBROX) 6.5 % OTIC solution Place 5 drops into both ears 2 (two) times daily.   clonazePAM (KLONOPIN) 0.5 MG tablet Take 0.5 mg by mouth 2 (two) times daily as needed for anxiety. Take 1/2 tablet by mouth twice a day   gabapentin (NEURONTIN) 300 MG capsule Take 300 mg by mouth daily. Take 1 to 2 capsules by mouth nightly for sleep   ipratropium (ATROVENT) 0.02 % nebulizer solution Take 2.5 mLs (0.5 mg total) by nebulization 4 (four) times daily. With albuterol   lamoTRIgine (LAMICTAL) 25 MG tablet Take 25 mg by mouth. Take 1 tablet in the morning and 1 tablet at night   metoprolol succinate (TOPROL-XL) 50 MG 24 hr tablet TAKE 1 & 1/2 (ONE & ONE-HALF) TABLETS BY MOUTH ONCE DAILY   Na Sulfate-K Sulfate-Mg Sulf (SUPREP BOWEL PREP KIT) 17.5-3.13-1.6 GM/177ML SOLN Take 1 kit by mouth as directed.   nortriptyline (PAMELOR) 10 MG capsule Take 10 mg by mouth. Take 1 tablet po  in the morning   nortriptyline (PAMELOR) 50 MG capsule Take 50 mg by mouth at bedtime. Take 1 capsule by mouth at bedtime   predniSONE (DELTASONE) 10 MG tablet Take 1 tab po 3 x day for 3 days then take 1 tab po 2 x a day for 3 days and then take 1 tab po daily for 3 days   Semaglutide, 1 MG/DOSE, 4 MG/3ML SOPN Inject 1 mg into the skin once a week.   traZODone (DESYREL) 50  MG tablet Take 50 mg by mouth at bedtime. Take 1 to 4 tablets by mouth nightly   [DISCONTINUED] azithromycin (ZITHROMAX) 250 MG tablet Take 2 tablets on day 1, then 1 tablet daily on days 2 through 5 (Patient not taking: Reported on 10/21/2021)   [DISCONTINUED] triamterene-hydrochlorothiazide (MAXZIDE-25) 37.5-25 MG tablet TAKE 1 TABLET BY MOUTH ONCE DAILY FOR FLUID   No facility-administered encounter medications on file as of 10/21/2021.      Medical History: Past Medical History:  Diagnosis Date   Allergy    environmental   Anemia    Asthma    Bronchitis, acute    Complication of anesthesia    HAD ASTHMA ATTACK  WHILE UNDER   Gastroesophageal reflux disease without esophagitis 11/02/2017   Hypertension    Seizure ([place])    None this year (2022)   Sleep apnea    CPAP     Vital Signs: BP 112/77   Pulse 88   Temp 98.5 F (36.9 C)   Resp 16   Ht _0  (1.575 m)   Wt 248 lb 9.6 oz (112.8 kg)   LMP 05/28/2015   SpO2 98%   BMI 45.47 kg/m    Review of Systems  Constitutional:  Positive for chills and fatigue. Negative for fever.  HENT:  Positive for congestion, postnasal drip, rhinorrhea, sinus pressure, sinus pain, sneezing and sore throat. Negative for ear pain and trouble swallowing.   Eyes: Negative.  Negative for pain.  Respiratory:  Positive for cough, shortness of breath and wheezing. Negative for chest tightness.   Cardiovascular: Negative.  Negative for chest pain and palpitations.  Gastrointestinal: Negative.  Negative for abdominal pain, constipation, diarrhea, nausea and vomiting.  Musculoskeletal: Negative.  Negative for myalgias.  Skin:  Negative for rash.  Neurological:  Positive for headaches.    Physical Exam Vitals reviewed.  Constitutional:      General: [name] is not in acute distress.    Appearance: [name] is obese. [name] is ill-appearing.  HENT:     Head: Normocephalic and atraumatic.     Right Ear: Tympanic membrane, ear canal and external ear normal.     Left Ear: Tympanic membrane, ear canal and external ear normal.     Nose: Congestion present. No rhinorrhea.     Mouth/Throat:     Mouth: Mucous membranes are moist.     Pharynx: Posterior oropharyngeal erythema present.  Eyes:     Pupils: Pupils are equal, round, and reactive to light.  Cardiovascular:     Rate and Rhythm: Normal rate and regular rhythm.     Heart sounds: Normal heart sounds. No murmur heard. Pulmonary:     Effort: Pulmonary effort is normal. No respiratory distress.     Breath sounds: Wheezing and rales present.  Lymphadenopathy:     Cervical: Cervical adenopathy present.  Neurological:      Mental Status: [name] is alert and oriented to person, place, and time.  Psychiatric:        Mood and Affect: Mood normal.        Behavior: Behavior normal.       Assessment/Plan: 1. Acute non-recurrent frontal sinusitis Empiric antibiotic treatment prescribed.  Augmentin was chosen because it treats sinus infections as well as upper and lower respiratory tract infections - amoxicillin-clavulanate (AUGMENTIN) 875-125 MG tablet; Take 1 tablet by mouth 2 (two) times daily for 14 days. Take with food.  Dispense: 28 tablet; Refill: 0  2. Respiratory crackles Chest x-ray ordered to rule out  pneumonia - DG Chest 2 View; Future  3. Wheezing Chest x-ray ordered to rule out pneumonia - DG Chest 2 View; Future  4. Acute cough Tussionex prescribed to provide relief from cough especially at night - chlorpheniramine-HYDROcodone (TUSSIONEX PENNKINETIC ER) 10-8 MG/5ML; Take 5 mLs by mouth every 12 (twelve) hours as needed for cough.  Dispense: 140 mL; Refill: 0  5. Vulvovaginal candidiasis Fluconazole prescribed due to the patient being prone to yeast infections when taking antibiotics - fluconazole (DIFLUCAN) 150 MG tablet; Take 1 tablet (150 mg total) by mouth once for 1 dose. May take an additional dose after 3 days if still symptomatic.  Dispense: 3 tablet; Refill: 0   General Counseling: [name] verbalizes understanding of the findings of todays visit and agrees with plan of treatment. I have discussed any further diagnostic evaluation that may be needed or ordered today. We also reviewed [name] medications today. [name] has been encouraged to call the office with any questions or concerns that should arise related to todays visit.    Counseling:    Orders Placed This Encounter  Procedures   DG Chest 2 View    Meds ordered this encounter  Medications   chlorpheniramine-HYDROcodone (TUSSIONEX PENNKINETIC ER) 10-8 MG/5ML    Sig: Take 5 mLs by mouth every 12 (twelve) hours as needed for  cough.    Dispense:  140 mL    Refill:  0   amoxicillin-clavulanate (AUGMENTIN) 875-125 MG tablet    Sig: Take 1 tablet by mouth 2 (two) times daily for 14 days. Take with food.    Dispense:  28 tablet    Refill:  0    Fill prescription asap today.   fluconazole (DIFLUCAN) 150 MG tablet    Sig: Take 1 tablet (150 mg total) by mouth once for 1 dose. May take an additional dose after 3 days if still symptomatic.    Dispense:  3 tablet    Refill:  0    Return if symptoms worsen or fail to improve.  [place] Controlled Substance Database was reviewed by me for overdose risk score (ORS)  Time spent: 30 minutes Time spent with patient included reviewing progress notes, labs, imaging studies, and discussing plan for follow up.   This patient was seen by Jonetta Osgood, FNP-C in collaboration with Dr. Clayborn Bigness as a part of collaborative care agreement.  Beauty Pless R. Valetta Fuller, MSN, FNP-C Internal Medicine

## 2021-10-22 ENCOUNTER — Telehealth: Payer: Self-pay

## 2021-10-22 NOTE — Telephone Encounter (Signed)
-----   Message from Jonetta Osgood, NP sent at 10/22/2021  1:18 AM EDT ----- Please let patient know that she does not have any pneumonia or other infection in her lungs according to her chest xray. She is most likely having an acute exacerbation of her asthma that is worsening and flaring up being she has a sinus infection that may be progressing into bronchitis. She is already on the right medications to treat the infection and asthma exacerbation so she does not need to do anything else except to take the medications I have prescribed her and rest and make sure to stay hydrated.

## 2021-10-22 NOTE — Telephone Encounter (Signed)
Spoke to pt, provided results  

## 2021-10-22 NOTE — Progress Notes (Signed)
Please let patient know that she does not have any pneumonia or other infection in her lungs according to her chest xray. She is most likely having an acute exacerbation of her asthma that is worsening and flaring up being she has a sinus infection that may be progressing into bronchitis. She is already on the right medications to treat the infection and asthma exacerbation so she does not need to do anything else except to take the medications I have prescribed her and rest and make sure to stay hydrated.

## 2021-11-05 ENCOUNTER — Ambulatory Visit: Payer: 59 | Admitting: Physician Assistant

## 2021-11-08 ENCOUNTER — Other Ambulatory Visit: Payer: Self-pay | Admitting: Nurse Practitioner

## 2021-11-08 DIAGNOSIS — I10 Essential (primary) hypertension: Secondary | ICD-10-CM

## 2021-11-12 ENCOUNTER — Ambulatory Visit: Payer: 59 | Admitting: Nurse Practitioner

## 2021-11-22 ENCOUNTER — Emergency Department
Admission: EM | Admit: 2021-11-22 | Discharge: 2021-11-22 | Disposition: A | Discharge status: Home / Self Care | Payer: 59 | Attending: Emergency Medicine | Admitting: Emergency Medicine

## 2021-11-22 DIAGNOSIS — R569 Unspecified convulsions: Secondary | ICD-10-CM | POA: Diagnosis present

## 2021-11-22 DIAGNOSIS — I10 Essential (primary) hypertension: Secondary | ICD-10-CM | POA: Insufficient documentation

## 2021-11-22 DIAGNOSIS — R42 Dizziness and giddiness: Secondary | ICD-10-CM | POA: Insufficient documentation

## 2021-11-22 DIAGNOSIS — J45909 Unspecified asthma, uncomplicated: Secondary | ICD-10-CM | POA: Insufficient documentation

## 2021-11-22 LAB — CBC WITH DIFFERENTIAL/PLATELET
Abs Immature Granulocytes: 0.01 10*3/uL (ref 0.00–0.07)
Basophils Absolute: 0 10*3/uL (ref 0.0–0.1)
Basophils Relative: 1 %
Eosinophils Absolute: 0.2 10*3/uL (ref 0.0–0.5)
Eosinophils Relative: 5 %
HCT: 39.5 % (ref 36.0–46.0)
Hemoglobin: 12.9 g/dL (ref 12.0–15.0)
Immature Granulocytes: 0 %
Lymphocytes Relative: 50 %
Lymphs Abs: 2.7 10*3/uL (ref 0.7–4.0)
MCH: 29.4 pg (ref 26.0–34.0)
MCHC: 32.7 g/dL (ref 30.0–36.0)
MCV: 90 fL (ref 80.0–100.0)
Monocytes Absolute: 0.4 10*3/uL (ref 0.1–1.0)
Monocytes Relative: 7 %
Neutro Abs: 2 10*3/uL (ref 1.7–7.7)
Neutrophils Relative %: 37 %
Platelets: 307 10*3/uL (ref 150–400)
RBC: 4.39 MIL/uL (ref 3.87–5.11)
RDW: 12.7 % (ref 11.5–15.5)
WBC: 5.4 10*3/uL (ref 4.0–10.5)
nRBC: 0 % (ref 0.0–0.2)

## 2021-11-22 LAB — BASIC METABOLIC PANEL
Anion gap: 8 (ref 5–15)
BUN: 12 mg/dL (ref 6–20)
CO2: 26 mmol/L (ref 22–32)
Calcium: 9.2 mg/dL (ref 8.9–10.3)
Chloride: 103 mmol/L (ref 98–111)
Creatinine, Ser: 0.83 mg/dL (ref 0.44–1.00)
GFR, Estimated: >60 mL/min (ref >60)
Glucose, Bld: 191 mg/dL — ABNORMAL HIGH (ref 70–99)
Potassium: 2.9 mmol/L — ABNORMAL LOW (ref 3.5–5.1)
Sodium: 137 mmol/L (ref 135–145)

## 2021-11-22 LAB — MAGNESIUM: Magnesium: 1.8 mg/dL (ref 1.7–2.4)

## 2021-11-22 MED ORDER — LORAZEPAM 2 MG/ML IJ SOLN
1.0000 mg | Freq: Once | INTRAMUSCULAR | Status: AC
  Administered 2021-11-22: 1 mg via INTRAVENOUS
  Filled 2021-11-22: qty 1

## 2021-11-22 MED ORDER — POTASSIUM CHLORIDE CRYS ER 20 MEQ PO TBCR
40.0000 meq | EXTENDED_RELEASE_TABLET | Freq: Once | ORAL | Status: AC
  Administered 2021-11-22: 40 meq via ORAL
  Filled 2021-11-22: qty 2

## 2021-11-22 MED ORDER — POTASSIUM CHLORIDE CRYS ER 20 MEQ PO TBCR
20.0000 meq | EXTENDED_RELEASE_TABLET | Freq: Once | ORAL | Status: AC
  Administered 2021-11-22: 20 meq via ORAL

## 2021-11-22 MED ORDER — LACTATED RINGERS IV BOLUS
1000.0000 mL | Freq: Once | INTRAVENOUS | Status: AC
  Administered 2021-11-22: 1000 mL via INTRAVENOUS

## 2021-11-22 NOTE — ED Triage Notes (Signed)
Seizures x 2 , pt axox4, no fall

## 2021-11-22 NOTE — ED Notes (Signed)
Pt husband at nurses station stating his wife is having a seizure. Pt alert when this RN entered room. Pt husband stating lasting a few seconds.

## 2021-11-30 ENCOUNTER — Telehealth: Payer: Self-pay

## 2021-11-30 NOTE — Telephone Encounter (Signed)
Left vm and sent mychart message to confirm 12/07/21 appointment-Toni 

## 2021-12-07 ENCOUNTER — Encounter: Payer: 59 | Admitting: Nurse Practitioner

## 2021-12-10 ENCOUNTER — Encounter: Payer: Self-pay | Admitting: Nurse Practitioner

## 2021-12-11 ENCOUNTER — Other Ambulatory Visit: Payer: Self-pay

## 2021-12-11 DIAGNOSIS — R000 Tachycardia, unspecified: Secondary | ICD-10-CM

## 2021-12-11 MED ORDER — METOPROLOL SUCCINATE ER 50 MG PO TB24: ORAL_TABLET | ORAL | 3 refills | Status: DC

## 2021-12-29 ENCOUNTER — Telehealth: Payer: Self-pay

## 2021-12-30 NOTE — Telephone Encounter (Signed)
error 

## 2021-12-31 ENCOUNTER — Telehealth: Payer: Self-pay

## 2021-12-31 NOTE — Telephone Encounter (Signed)
Pt called for labs done by dr Malvin Johns advised pt advised we will discuss at next visit

## 2022-01-06 ENCOUNTER — Ambulatory Visit (INDEPENDENT_AMBULATORY_CARE_PROVIDER_SITE_OTHER): Payer: 59 | Admitting: Nurse Practitioner

## 2022-01-06 ENCOUNTER — Encounter: Payer: Self-pay | Admitting: Nurse Practitioner

## 2022-01-06 ENCOUNTER — Other Ambulatory Visit
Admission: RE | Admit: 2022-01-06 | Discharge: 2022-01-06 | Disposition: A | Discharge status: Home / Self Care | Payer: 59 | Attending: Nurse Practitioner | Admitting: Nurse Practitioner

## 2022-01-06 VITALS — BP 130/84 | HR 97 | Temp 97.8°F | Resp 16 | Ht 62.0 in | Wt 252.0 lb

## 2022-01-06 DIAGNOSIS — Z1231 Encounter for screening mammogram for malignant neoplasm of breast: Secondary | ICD-10-CM

## 2022-01-06 DIAGNOSIS — J4540 Moderate persistent asthma, uncomplicated: Secondary | ICD-10-CM | POA: Diagnosis not present

## 2022-01-06 DIAGNOSIS — G4733 Obstructive sleep apnea (adult) (pediatric): Secondary | ICD-10-CM

## 2022-01-06 DIAGNOSIS — E611 Iron deficiency: Secondary | ICD-10-CM

## 2022-01-06 DIAGNOSIS — Z0001 Encounter for general adult medical examination with abnormal findings: Secondary | ICD-10-CM | POA: Diagnosis not present

## 2022-01-06 DIAGNOSIS — E538 Deficiency of other specified B group vitamins: Secondary | ICD-10-CM

## 2022-01-06 DIAGNOSIS — I10 Essential (primary) hypertension: Secondary | ICD-10-CM | POA: Diagnosis not present

## 2022-01-06 DIAGNOSIS — Z23 Encounter for immunization: Secondary | ICD-10-CM

## 2022-01-06 DIAGNOSIS — B353 Tinea pedis: Secondary | ICD-10-CM

## 2022-01-06 LAB — FERRITIN: Ferritin: 46 ng/mL (ref 11–307)

## 2022-01-06 LAB — IRON AND TIBC
Iron: 69 ug/dL (ref 28–170)
Saturation Ratios: 16 % (ref 10.4–31.8)
TIBC: 431 ug/dL (ref 250–450)
UIBC: 362 ug/dL

## 2022-01-06 MED ORDER — CYANOCOBALAMIN 1000 MCG/ML IJ SOLN
1000.0000 ug | Freq: Once | INTRAMUSCULAR | Status: AC
  Administered 2022-01-06: 1000 ug via INTRAMUSCULAR

## 2022-01-06 MED ORDER — ZOSTER VAC RECOMB ADJUVANTED 50 MCG/0.5ML IM SUSR: 0.5000 mL | Freq: Once | INTRAMUSCULAR | 0 refills | Status: AC

## 2022-01-06 NOTE — Progress Notes (Signed)
[facility] [place], [place] [address]  Internal MEDICINE  Office Visit Note  Patient Name: [name]  [id number]  [id number]  Date of Service: 01/06/2022  Chief Complaint  Patient presents with   Annual Exam    Discuss lab   Hypertension   Asthma    HPI [name] presents for an annual well visit and physical exam.  Well-appearing 51 year old female with hypertension, GERD, hyperlipidemia, impaired fasting glucose, asthma, and seizures.  --has had recurrent sinus infections over the past 12 months and at least 1 asthma exacerbation. --last visit to ER was in late June for seizure-like activity; is followed by neurology, Dr. Melrose [name].  --preventive screenings: mammogram overdue; routine colonoscopy done 09/2020, due again in 2032; shingles vaccine due --recent labs drawn in late July at [facility] clinic:  B12 borderline low 309, all other labs grossly normal; no recent iron panel --has gained 10 lbs since last visit in june --For asthma -- takes symbicort twice daily for maintenance and has albuterol rescue inhaler and albuterol and ipratropium neb solutions prn SOB/cough/wheezing.  --BP controlled with metoprolol and triamterene-HCTZ.  --takes trazodone for insomnia -- impaired fasting glucose, was 191 most recently. Have not had A1C checked since 2018.     Current Medication: Outpatient Encounter Medications as of 01/06/2022  Medication Sig   albuterol (PROVENTIL HFA;VENTOLIN HFA) 108 (90 BASE) MCG/ACT inhaler Inhale 2 puffs into the lungs every 6 (six) hours as needed for wheezing or shortness of breath.   albuterol (PROVENTIL) (2.5 MG/3ML) 0.083% nebulizer solution Take 3 mLs (2.5 mg total) by nebulization every 4 (four) hours as needed for wheezing or shortness of breath. Mixed with ipratropium due to duoneb back order   ARIPiprazole (ABILIFY) 15 MG tablet Take 15 mg by mouth daily. Take 1 tablet by mouth at night   budesonide-formoterol  (SYMBICORT) 160-4.5 MCG/ACT inhaler Inhale 2 puffs into the lungs every 12 (twelve) hours.   carbamide peroxide (DEBROX) 6.5 % OTIC solution Place 5 drops into both ears 2 (two) times daily.   clonazePAM (KLONOPIN) 0.5 MG tablet Take 0.5 mg by mouth 2 (two) times daily as needed for anxiety. Take 1/2 tablet by mouth twice a day   gabapentin (NEURONTIN) 300 MG capsule Take 300 mg by mouth daily. Take 1 to 2 capsules by mouth nightly for sleep   ipratropium (ATROVENT) 0.02 % nebulizer solution Take 2.5 mLs (0.5 mg total) by nebulization 4 (four) times daily. With albuterol   lamoTRIgine (LAMICTAL) 25 MG tablet Take 25 mg by mouth. Take 1 tablet in the morning and 1 tablet at night   metoprolol succinate (TOPROL-XL) 50 MG 24 hr tablet TAKE 1 & 1/2 (ONE & ONE-HALF) TABLETS BY MOUTH ONCE DAILY   nortriptyline (PAMELOR) 10 MG capsule Take 10 mg by mouth. Take 1 tablet po  in the morning   nortriptyline (PAMELOR) 50 MG capsule Take 50 mg by mouth at bedtime. Take 1 capsule by mouth at bedtime   traZODone (DESYREL) 50 MG tablet Take 50 mg by mouth at bedtime. Take 1 to 4 tablets by mouth nightly   triamterene-hydrochlorothiazide (MAXZIDE-25) 37.5-25 MG tablet TAKE 1 TABLET BY MOUTH ONCE DAILY FOR FLUID   [DISCONTINUED] benzonatate (TESSALON) 100 MG capsule Take 2 capsules (200 mg total) by mouth 2 (two) times daily as needed for cough.   [DISCONTINUED] chlorpheniramine-HYDROcodone (TUSSIONEX PENNKINETIC ER) 10-8 MG/5ML Take 5 mLs by mouth every 12 (twelve) hours as needed for cough.   [DISCONTINUED]  predniSONE (DELTASONE) 10 MG tablet Take 1 tab po 3 x day for 3 days then take 1 tab po 2 x a day for 3 days and then take 1 tab po daily for 3 days   [DISCONTINUED] Semaglutide, 1 MG/DOSE, 4 MG/3ML SOPN Inject 1 mg into the skin once a week.   [DISCONTINUED] Zoster Vaccine Adjuvanted [facility]) injection Inject 0.5 mLs into the muscle once.   Zoster Vaccine Adjuvanted [facility]) injection Inject 0.5 mLs into the  muscle once for 1 dose.   [DISCONTINUED] Na Sulfate-K Sulfate-Mg Sulf (SUPREP BOWEL PREP KIT) 17.5-3.13-1.6 GM/177ML SOLN Take 1 kit by mouth as directed. (Patient not taking: Reported on 01/06/2022)   No facility-administered encounter medications on file as of 01/06/2022.    Surgical History: Past Surgical History:  Procedure Laterality Date   ABDOMINAL HYSTERECTOMY N/A 06/09/2015   Procedure: HYSTERECTOMY ABDOMINAL with Bilateral Salpingectomy;  Surgeon: Boykin Nearing, MD;  Location: [facility] ORS;  Service: Gynecology;  Laterality: N/A;   CESAREAN SECTION  [id number]   COLONOSCOPY WITH PROPOFOL N/A 10/24/2020   Procedure: COLONOSCOPY WITH PROPOFOL;  Surgeon: [name], MD;  Location: [place];  Service: Endoscopy;  Laterality: N/A;  sleep apnea   FINGER SURGERY  1992   GASTRIC BYPASS     INGUINAL HERNIA REPAIR Right 2010   REPLACEMENT TOTAL KNEE Right     Medical History: Past Medical History:  Diagnosis Date   Allergy    environmental   Anemia    Asthma    Bronchitis, acute    Complication of anesthesia    HAD ASTHMA ATTACK WHILE UNDER   Gastroesophageal reflux disease without esophagitis 11/02/2017   Hypertension    Seizure ([place])    None this year (2022)   Sleep apnea    CPAP    Family History: Family History  Problem Relation Age of Onset   Diabetes Mother    Hypertension Mother    Cancer Mother    Congestive Heart Failure Father    Diabetes Father     Social History   Socioeconomic History   Marital status: Married    Spouse name: Not on file   Number of children: Not on file   Years of education: Not on file   Highest education level: Not on file  Occupational History   Not on file  Tobacco Use   Smoking status: Never   Smokeless tobacco: Never  Vaping Use   Vaping Use: Never used  Substance and Sexual Activity   Alcohol use: No   Drug use: No   Sexual activity: Yes  Other Topics Concern   Not on file  Social History Narrative    Not on file   Social Determinants of Health   Financial Resource Strain: Not on file  Food Insecurity: Not on file  Transportation Needs: Not on file  Physical Activity: Not on file  Stress: Not on file  Social Connections: Not on file  Intimate Partner Violence: Not on file      Review of Systems  Constitutional:  Positive for fatigue and unexpected weight change. Negative for activity change, appetite change, chills and fever.  HENT: Negative.  Negative for congestion, ear pain, rhinorrhea, sore throat and trouble swallowing.   Eyes: Negative.   Respiratory: Negative.  Negative for cough, chest tightness, shortness of breath and wheezing.   Cardiovascular: Negative.  Negative for chest pain.  Gastrointestinal: Negative.  Negative for abdominal pain, blood in stool, constipation, diarrhea, nausea and vomiting.  Endocrine:  Negative.   Genitourinary: Negative.  Negative for difficulty urinating, dysuria, frequency, hematuria and urgency.  Musculoskeletal: Negative.  Negative for arthralgias, back pain, joint swelling, myalgias and neck pain.  Skin: Negative.  Negative for rash and wound.  Allergic/Immunologic: Negative.  Negative for immunocompromised state.  Neurological: Negative.  Negative for dizziness, seizures, numbness and headaches.  Hematological: Negative.   Psychiatric/Behavioral: Negative.  Negative for behavioral problems, self-injury and suicidal ideas. The patient is not nervous/anxious.     Vital Signs: BP 130/84   Pulse 97   Temp 97.8 F (36.6 C)   Resp 16   Ht 5' 2" (1.575 m)   Wt 252 lb (114.3 kg)   LMP 05/28/2015   SpO2 96%   BMI 46.09 kg/m    Physical Exam Vitals reviewed.  Constitutional:      General: She is awake. She is not in acute distress.    Appearance: Normal appearance. She is well-developed and well-groomed. She is obese. She is not ill-appearing or diaphoretic.  HENT:     Head: Normocephalic and atraumatic.     Right Ear: External  ear normal.     Left Ear: External ear normal.     Nose: Nose normal.     Mouth/Throat:     Lips: Pink.     Pharynx: Oropharynx is clear. Uvula midline. No oropharyngeal exudate.  Eyes:     General: Lids are normal. Vision grossly intact. Gaze aligned appropriately. No scleral icterus.       Right eye: No discharge.        Left eye: No discharge.     Conjunctiva/sclera: Conjunctivae normal.     Pupils: Pupils are equal, round, and reactive to light.     Funduscopic exam:    Right eye: Red reflex present.        Left eye: Red reflex present. Neck:     Thyroid: No thyromegaly.     Vascular: No JVD.     Trachea: No tracheal deviation.  Cardiovascular:     Rate and Rhythm: Normal rate and regular rhythm.     Heart sounds: Normal heart sounds, S1 normal and S2 normal. No murmur heard.    No friction rub. No gallop.  Pulmonary:     Effort: Pulmonary effort is normal. No accessory muscle usage or respiratory distress.     Breath sounds: Normal breath sounds and air entry. No stridor. No decreased breath sounds, wheezing or rales.  Chest:     Chest wall: No tenderness.  Breasts:    Breasts are symmetrical.     Right: Normal. No swelling, bleeding, inverted nipple, mass, nipple discharge, skin change or tenderness.     Left: Normal. No swelling, bleeding, inverted nipple, mass, nipple discharge, skin change or tenderness.  Abdominal:     General: Bowel sounds are normal. There is no distension.     Palpations: Abdomen is soft. There is no shifting dullness, fluid wave, mass or pulsatile mass.     Tenderness: There is no abdominal tenderness. There is no guarding or rebound.  Musculoskeletal:        General: No tenderness or deformity. Normal range of motion.     Cervical back: Normal range of motion and neck supple.     Right lower leg: 1+ Edema present.     Left lower leg: 1+ Edema present.  Lymphadenopathy:     Cervical: No cervical adenopathy.     Upper Body:     Right upper  body: No  supraclavicular, axillary or pectoral adenopathy.     Left upper body: No supraclavicular, axillary or pectoral adenopathy.  Skin:    General: Skin is warm and dry.     Coloration: Skin is not pale.     Findings: No erythema or rash.     Comments: No atypical moles or suspicious lesions noted   Neurological:     Mental Status: She is alert.     Cranial Nerves: No cranial nerve deficit.     Motor: No abnormal muscle tone.     Coordination: Coordination normal.     Deep Tendon Reflexes: Reflexes are normal and symmetric.  Psychiatric:        Behavior: Behavior normal. Behavior is cooperative.        Thought Content: Thought content normal.        Judgment: Judgment normal.        Assessment/Plan: 1. Encounter for general adult medical examination with abnormal findings Age-appropriate preventive screenings and vaccinations discussed, annual physical exam completed. Routine labs for health maintenance ordered, see below. PHM updated.  --will need upcoming visit to discuss glucose, A1C, weight gain.   2. Essential hypertension Stable with metoprolol and triamterene-HCTZ, continue as prescribed.   3. Moderate persistent asthma without complication Stable with current medications, continue as prescribed.   4. Iron deficiency Routine lab ordered - Iron, TIBC and Ferritin Panel  5. B12 deficiency Received B12 injection in today - cyanocobalamin (VITAMIN B12) injection 1,000 mcg  6. Encounter for screening mammogram for malignant neoplasm of breast Patient to call [facility] for routine mammogram - MM 3D SCREEN BREAST BILATERAL; Future  7. Need for vaccination - Zoster Vaccine Adjuvanted [facility]) injection; Inject 0.5 mLs into the muscle once for 1 dose.  Dispense: 0.5 mL; Refill: 0      General Counseling: Leeasia verbalizes understanding of the findings of todays visit and agrees with plan of treatment. I have discussed any further diagnostic  evaluation that may be needed or ordered today. We also reviewed her medications today. she has been encouraged to call the office with any questions or concerns that should arise related to todays visit.    Orders Placed This Encounter  Procedures   MM 3D SCREEN BREAST BILATERAL   UA/M w/rflx Culture, Routine   Iron, TIBC and Ferritin Panel    Meds ordered this encounter  Medications   Zoster Vaccine Adjuvanted [facility]) injection    Sig: Inject 0.5 mLs into the muscle once for 1 dose.    Dispense:  0.5 mL    Refill:  0    Return in about 6 months (around 07/09/2022) for F/U, Demiana Crumbley PCP.   Total time spent:30 Minutes Time spent includes review of chart, medications, test results, and follow up plan with the patient.   [place] Controlled Substance Database was reviewed by me.  This patient was seen by Jonetta Osgood, FNP-C in collaboration with Dr. Clayborn Bigness as a part of collaborative care agreement.  Zahir Eisenhour R. Valetta Fuller, MSN, FNP-C Internal medicine

## 2022-01-07 ENCOUNTER — Telehealth: Payer: Self-pay

## 2022-01-07 NOTE — Telephone Encounter (Signed)
Lvm notifying patient of mammogram appointment date, time & location-Toni 

## 2022-01-13 ENCOUNTER — Ambulatory Visit (INDEPENDENT_AMBULATORY_CARE_PROVIDER_SITE_OTHER): Payer: 59

## 2022-01-13 DIAGNOSIS — E538 Deficiency of other specified B group vitamins: Secondary | ICD-10-CM | POA: Diagnosis not present

## 2022-01-13 MED ORDER — CYANOCOBALAMIN 1000 MCG/ML IJ SOLN
1000.0000 ug | Freq: Once | INTRAMUSCULAR | Status: AC
  Administered 2022-01-13: 1000 ug via INTRAMUSCULAR

## 2022-01-20 ENCOUNTER — Ambulatory Visit (INDEPENDENT_AMBULATORY_CARE_PROVIDER_SITE_OTHER): Payer: 59

## 2022-01-20 DIAGNOSIS — E538 Deficiency of other specified B group vitamins: Secondary | ICD-10-CM

## 2022-01-20 MED ORDER — CYANOCOBALAMIN 1000 MCG/ML IJ SOLN
1000.0000 ug | Freq: Once | INTRAMUSCULAR | Status: AC
  Administered 2022-01-20: 1000 ug via INTRAMUSCULAR

## 2022-02-01 NOTE — Progress Notes (Signed)
Iron panel is normal

## 2022-02-02 ENCOUNTER — Ambulatory Visit
Admission: RE | Admit: 2022-02-02 | Discharge: 2022-02-02 | Disposition: A | Discharge status: Home / Self Care | Payer: 59 | Source: Ambulatory Visit | Attending: Nurse Practitioner | Admitting: Nurse Practitioner

## 2022-02-02 DIAGNOSIS — Z1231 Encounter for screening mammogram for malignant neoplasm of breast: Secondary | ICD-10-CM | POA: Insufficient documentation

## 2022-02-03 ENCOUNTER — Telehealth: Payer: Self-pay

## 2022-02-03 NOTE — Telephone Encounter (Signed)
-----   Message from Sallyanne Kuster, NP sent at 02/01/2022 10:50 PM EDT ----- Iron panel is normal

## 2022-02-03 NOTE — Telephone Encounter (Signed)
Left message for patient regarding lab results. 

## 2022-02-17 ENCOUNTER — Ambulatory Visit (INDEPENDENT_AMBULATORY_CARE_PROVIDER_SITE_OTHER): Payer: 59

## 2022-02-17 DIAGNOSIS — E538 Deficiency of other specified B group vitamins: Secondary | ICD-10-CM

## 2022-02-17 MED ORDER — CYANOCOBALAMIN 1000 MCG/ML IJ SOLN
1000.0000 ug | Freq: Once | INTRAMUSCULAR | Status: AC
  Administered 2022-02-17: 1000 ug via INTRAMUSCULAR

## 2022-03-17 ENCOUNTER — Ambulatory Visit: Payer: 59 | Admitting: Nurse Practitioner

## 2022-03-29 ENCOUNTER — Encounter: Payer: Self-pay | Admitting: Nurse Practitioner

## 2022-03-29 ENCOUNTER — Ambulatory Visit (INDEPENDENT_AMBULATORY_CARE_PROVIDER_SITE_OTHER): Payer: 59 | Admitting: Nurse Practitioner

## 2022-03-29 VITALS — BP 119/80 | HR 78 | Temp 98.2°F | Resp 16 | Ht 62.0 in | Wt 258.4 lb

## 2022-03-29 DIAGNOSIS — E538 Deficiency of other specified B group vitamins: Secondary | ICD-10-CM

## 2022-03-29 MED ORDER — CYANOCOBALAMIN 1000 MCG/ML IJ SOLN
1000.0000 ug | Freq: Once | INTRAMUSCULAR | Status: AC
  Administered 2022-03-29: 1000 ug via INTRAMUSCULAR

## 2022-03-30 ENCOUNTER — Telehealth: Payer: Self-pay

## 2022-03-30 ENCOUNTER — Encounter: Payer: Self-pay | Admitting: Emergency Medicine

## 2022-03-30 ENCOUNTER — Emergency Department
Admission: EM | Admit: 2022-03-30 | Discharge: 2022-03-30 | Disposition: A | Discharge status: Home / Self Care | Payer: 59 | Attending: Emergency Medicine | Admitting: Emergency Medicine

## 2022-03-30 ENCOUNTER — Emergency Department: Payer: 59

## 2022-03-30 ENCOUNTER — Other Ambulatory Visit: Payer: Self-pay

## 2022-03-30 DIAGNOSIS — R42 Dizziness and giddiness: Secondary | ICD-10-CM | POA: Diagnosis present

## 2022-03-30 DIAGNOSIS — I10 Essential (primary) hypertension: Secondary | ICD-10-CM | POA: Diagnosis not present

## 2022-03-30 LAB — URINALYSIS, ROUTINE W REFLEX MICROSCOPIC
Bilirubin Urine: NEGATIVE
Glucose, UA: NEGATIVE mg/dL
Hgb urine dipstick: NEGATIVE
Ketones, ur: NEGATIVE mg/dL
Leukocytes,Ua: NEGATIVE
Nitrite: NEGATIVE
Protein, ur: NEGATIVE mg/dL
Specific Gravity, Urine: 1.016 (ref 1.005–1.030)
pH: 6 (ref 5.0–8.0)

## 2022-03-30 LAB — BASIC METABOLIC PANEL
Anion gap: 6 (ref 5–15)
BUN: 12 mg/dL (ref 6–20)
CO2: 30 mmol/L (ref 22–32)
Calcium: 9.2 mg/dL (ref 8.9–10.3)
Chloride: 99 mmol/L (ref 98–111)
Creatinine, Ser: 1.01 mg/dL — ABNORMAL HIGH (ref 0.44–1.00)
GFR, Estimated: >60 mL/min (ref >60)
Glucose, Bld: 113 mg/dL — ABNORMAL HIGH (ref 70–99)
Potassium: 3.3 mmol/L — ABNORMAL LOW (ref 3.5–5.1)
Sodium: 135 mmol/L (ref 135–145)

## 2022-03-30 LAB — CBC
HCT: 36.8 % (ref 36.0–46.0)
Hemoglobin: 12.3 g/dL (ref 12.0–15.0)
MCH: 29.5 pg (ref 26.0–34.0)
MCHC: 33.4 g/dL (ref 30.0–36.0)
MCV: 88.2 fL (ref 80.0–100.0)
Platelets: 286 10*3/uL (ref 150–400)
RBC: 4.17 MIL/uL (ref 3.87–5.11)
RDW: 12.6 % (ref 11.5–15.5)
WBC: 4.7 10*3/uL (ref 4.0–10.5)
nRBC: 0 % (ref 0.0–0.2)

## 2022-03-30 LAB — CBG MONITORING, ED: Glucose-Capillary: 92 mg/dL (ref 70–99)

## 2022-03-30 MED ORDER — ONDANSETRON 4 MG PO TBDP: 4.0000 mg | ORAL_TABLET | Freq: Three times a day (TID) | ORAL | 0 refills | Status: DC | PRN

## 2022-03-30 MED ORDER — MECLIZINE HCL 25 MG PO TABS
25.0000 mg | ORAL_TABLET | Freq: Once | ORAL | Status: AC
  Administered 2022-03-30: 25 mg via ORAL
  Filled 2022-03-30: qty 1

## 2022-03-30 MED ORDER — ONDANSETRON HCL 4 MG/2ML IJ SOLN
4.0000 mg | Freq: Once | INTRAMUSCULAR | Status: AC
  Administered 2022-03-30: 4 mg via INTRAVENOUS
  Filled 2022-03-30: qty 2

## 2022-03-30 MED ORDER — MECLIZINE HCL 25 MG PO TABS: 25.0000 mg | ORAL_TABLET | Freq: Three times a day (TID) | ORAL | 0 refills | Status: DC | PRN

## 2022-03-30 NOTE — ED Provider Notes (Signed)
[facility] Provider Note    Event Date/Time   First MD Initiated Contact with Patient 03/30/22 1226     (approximate)   History   Dizziness   HPI  [name] is a 51 y.o. female with a history of hypertension, seizure who presents with complaints of dizziness.  Patient reports a sensation of the room spinning since this morning.  She denies weakness or sensory changes.  No headache.  No history of the same.  No new medications reported.  No lightheadedness     Physical Exam   Triage Vital Signs: ED Triage Vitals  Enc Vitals Group     BP 03/30/22 1117 (!) 117/92     Pulse Rate 03/30/22 1117 90     Resp 03/30/22 1117 18     Temp 03/30/22 1117 98.4 F (36.9 C)     Temp Source 03/30/22 1117 Oral     SpO2 03/30/22 1117 98 %     Weight 03/30/22 1230 117.2 kg (258 lb 6.1 oz)     Height 03/30/22 1230 1.575 m (5\' 2" )     Head Circumference --      Peak Flow --      Pain Score 03/30/22 1117 0     Pain Loc --      Pain Edu? --      Excl. in [place]? --     Most recent vital signs: Vitals:   03/30/22 1117  BP: (!) 117/92  Pulse: 90  Resp: 18  Temp: 98.4 F (36.9 C)  SpO2: 98%     General: Awake, no distress.  CV:  Good peripheral perfusion.  Resp:  Normal effort.  Abd:  No distention.  Other:  PERRLA, mild left nystagmus, cranial nerves II through XII normal, normal ambulation, normal strength in all extremities   ED Results / Procedures / Treatments   Labs (all labs ordered are listed, but only abnormal results are displayed) Labs Reviewed  BASIC METABOLIC PANEL - Abnormal; Notable for the following components:      Result Value   Potassium 3.3 (*)    Glucose, Bld 113 (*)    Creatinine, Ser 1.01 (*)    All other components within normal limits  CBC  URINALYSIS, ROUTINE W REFLEX MICROSCOPIC  CBG MONITORING, ED     EKG  ED ECG REPORT I, Lavonia Drafts, the attending physician, personally viewed and interpreted this  ECG.  Date: 03/30/2022  Rhythm: normal sinus rhythm QRS Axis: normal Intervals: normal ST/T Wave abnormalities: normal Narrative Interpretation: no evidence of acute ischemia    RADIOLOGY CT head viewed interpreted by me, no acute abnormality    PROCEDURES:  Critical Care performed:   Procedures   MEDICATIONS ORDERED IN ED: Medications  ondansetron (ZOFRAN) injection 4 mg (4 mg Intravenous Given 03/30/22 1329)  meclizine (ANTIVERT) tablet 25 mg (25 mg Oral Given 03/30/22 1329)     IMPRESSION / MDM / ASSESSMENT AND PLAN / ED COURSE  I reviewed the triage vital signs and the nursing notes. Patient's presentation is most consistent with acute presentation with potential threat to life or bodily function.  Patient presents with vertigo/dizziness symptoms as detailed above.  Suspect BPV given her age and presenting complaint and lateral nystagmus.  Doubt central vertigo  Lab work reviewed and is reassuring.  Neuro exam is normal.  CT head without abnormality.  Treated with IV Zofran, p.o. meclizine with improvement.  Will discharge with meclizine and Zofran and outpatient follow-up  with ENT.  Return precautions discussed.        FINAL CLINICAL IMPRESSION(S) / ED DIAGNOSES   Final diagnoses:  Vertigo     Rx / DC Orders   ED Discharge Orders          Ordered    meclizine (ANTIVERT) 25 MG tablet  3 times daily PRN        03/30/22 1336    ondansetron (ZOFRAN-ODT) 4 MG disintegrating tablet  Every 8 hours PRN        03/30/22 1336             Note:  This document was prepared using Dragon voice recognition software and may include unintentional dictation errors.   Jene Every, MD 03/30/22 1353

## 2022-03-30 NOTE — ED Notes (Signed)
See triage note  Presents with some dizziness   hx of vertigo in past

## 2022-03-30 NOTE — ED Triage Notes (Signed)
Pt to ED via POV from home. Pt reports she woke up and started feeling dizzy. Pt reports dizziness has been constant. Pt denies hx vertigo. Pt denies CP or SOB. Pt states mild blurry vision. Pt reports hx of seizures and is on Lamictal and has been med compliant.

## 2022-03-30 NOTE — ED Provider Triage Note (Signed)
Emergency Medicine Provider Triage Evaluation Note  DEVEN AUDI , a 51 y.o. female  was evaluated in triage.  Pt complains of feeling like the room is spinning/dizziness since awakening this morning. No previous history of the same. No pain/nausea/vomiting.  Physical Exam  BP (!) 117/92 (BP Location: Right Arm)   Pulse 90   Temp 98.4 F (36.9 C) (Oral)   Resp 18   LMP 05/28/2015   SpO2 98%  Gen:   Awake, no distress   Resp:  Normal effort  MSK:   Moves extremities without difficulty  Other:    Medical Decision Making  Medically screening exam initiated at 11:21 AM.  Appropriate orders placed.  Joretta E Oliveri was informed that the remainder of the evaluation will be completed by another provider, this initial triage assessment does not replace that evaluation, and the importance of remaining in the ED until their evaluation is complete.   Victorino Dike, FNP 03/30/22 1122

## 2022-04-02 NOTE — Telephone Encounter (Signed)
Patient was notified to go to ER. Done

## 2022-04-14 ENCOUNTER — Ambulatory Visit: Payer: 59

## 2022-04-18 ENCOUNTER — Other Ambulatory Visit: Payer: Self-pay | Admitting: Physician Assistant

## 2022-04-19 NOTE — Telephone Encounter (Signed)
Its ok to send separate pres please review

## 2022-04-27 ENCOUNTER — Encounter: Payer: Self-pay | Admitting: Nurse Practitioner

## 2022-04-27 ENCOUNTER — Ambulatory Visit (INDEPENDENT_AMBULATORY_CARE_PROVIDER_SITE_OTHER): Payer: 59 | Admitting: Nurse Practitioner

## 2022-04-27 VITALS — BP 153/108 | HR 112 | Temp 98.3°F | Resp 16 | Ht 62.0 in | Wt 271.4 lb

## 2022-04-27 DIAGNOSIS — R0789 Other chest pain: Secondary | ICD-10-CM | POA: Diagnosis not present

## 2022-04-27 DIAGNOSIS — R600 Localized edema: Secondary | ICD-10-CM

## 2022-04-27 DIAGNOSIS — E538 Deficiency of other specified B group vitamins: Secondary | ICD-10-CM | POA: Diagnosis not present

## 2022-04-27 DIAGNOSIS — I10 Essential (primary) hypertension: Secondary | ICD-10-CM | POA: Diagnosis not present

## 2022-04-27 DIAGNOSIS — R0602 Shortness of breath: Secondary | ICD-10-CM

## 2022-04-27 DIAGNOSIS — R0609 Other forms of dyspnea: Secondary | ICD-10-CM | POA: Diagnosis not present

## 2022-04-27 DIAGNOSIS — R6889 Other general symptoms and signs: Secondary | ICD-10-CM

## 2022-04-27 MED ORDER — FUROSEMIDE 40 MG PO TABS: 40.0000 mg | ORAL_TABLET | Freq: Every day | ORAL | 3 refills | Status: DC

## 2022-04-27 MED ORDER — IRBESARTAN 150 MG PO TABS: 150.0000 mg | ORAL_TABLET | Freq: Every day | ORAL | 2 refills | Status: DC

## 2022-04-27 MED ORDER — CYANOCOBALAMIN 1000 MCG/ML IJ SOLN
1000.0000 ug | Freq: Once | INTRAMUSCULAR | Status: AC
  Administered 2022-04-27: 1000 ug via INTRAMUSCULAR

## 2022-04-27 NOTE — Progress Notes (Signed)
B12 injection done by nursing staff

## 2022-04-27 NOTE — Patient Instructions (Signed)
STOP:  Triamterene-hydrochlorothiazide  START:  Furosemide 40 mg daily (in the morning).   Irbesartan 150 mg daily

## 2022-04-27 NOTE — Progress Notes (Signed)
[facility] [address] [place], [place] [address]  Internal MEDICINE  Office Visit Note  Patient Name: [name]  [id number]  [id number]  Date of Service: 04/27/2022  Chief Complaint  Patient presents with   Acute Visit    Bilateral swollen ankles.      HPI [name] presents for an acute sick visit for high BP and swollen ankles --Gained 13 lbs since last month --BP elevated -- takes metoprolol succinate and triamterene-HCTZ --sleeps on 2 pillows, SOB with laying flat, orthopnea --bilateral lower extremity edema  --SOB with minimal exertion --no recent EKG or echocardiogram EKG done in office, was NSR Some headaches but none today     Current Medication:  Outpatient Encounter Medications as of 04/27/2022  Medication Sig   albuterol (PROVENTIL HFA;VENTOLIN HFA) 108 (90 BASE) MCG/ACT inhaler Inhale 2 puffs into the lungs every 6 (six) hours as needed for wheezing or shortness of breath.   albuterol (PROVENTIL) (2.5 MG/3ML) 0.083% nebulizer solution USE 3 ML IN NEBULIZER EVERY 4 HOURS AS NEEDED FOR  WHEEZING OR SHORTNESS OF BREATH MIXED WITH IPRATROPIUM   ARIPiprazole (ABILIFY) 15 MG tablet Take 15 mg by mouth daily. Take 1 tablet by mouth at night   budesonide-formoterol (SYMBICORT) 160-4.5 MCG/ACT inhaler Inhale 2 puffs into the lungs every 12 (twelve) hours.   carbamide peroxide (DEBROX) 6.5 % OTIC solution Place 5 drops into both ears 2 (two) times daily.   clonazePAM (KLONOPIN) 0.5 MG tablet Take 0.5 mg by mouth 2 (two) times daily as needed for anxiety. Take 1/2 tablet by mouth twice a day   furosemide (LASIX) 40 MG tablet Take 1 tablet (40 mg total) by mouth daily.   gabapentin (NEURONTIN) 300 MG capsule Take 300 mg by mouth daily. Take 1 to 2 capsules by mouth nightly for sleep   ipratropium (ATROVENT) 0.02 % nebulizer solution TAKE 2.5MLS BY NEBULIZATION FOUR TIMES DAILY WITH ALBUTEROL   irbesartan (AVAPRO) 150 MG tablet Take 1 tablet (150 mg  total) by mouth daily.   lamoTRIgine (LAMICTAL) 25 MG tablet Take 25 mg by mouth. Take 1 tablet in the morning and 1 tablet at night   meclizine (ANTIVERT) 25 MG tablet Take 1 tablet (25 mg total) by mouth 3 (three) times daily as needed for dizziness.   metoprolol succinate (TOPROL-XL) 50 MG 24 hr tablet TAKE 1 & 1/2 (ONE & ONE-HALF) TABLETS BY MOUTH ONCE DAILY   nortriptyline (PAMELOR) 10 MG capsule Take 10 mg by mouth. Take 1 tablet po  in the morning   nortriptyline (PAMELOR) 50 MG capsule Take 50 mg by mouth at bedtime. Take 1 capsule by mouth at bedtime   ondansetron (ZOFRAN-ODT) 4 MG disintegrating tablet Take 1 tablet (4 mg total) by mouth every 8 (eight) hours as needed for nausea or vomiting.   traZODone (DESYREL) 50 MG tablet Take 50 mg by mouth at bedtime. Take 1 to 4 tablets by mouth nightly   [DISCONTINUED] triamterene-hydrochlorothiazide (MAXZIDE-25) 37.5-25 MG tablet TAKE 1 TABLET BY MOUTH ONCE DAILY FOR FLUID   No facility-administered encounter medications on file as of 04/27/2022.      Medical History: Past Medical History:  Diagnosis Date   Allergy    environmental   Anemia    Asthma    Bronchitis, acute    Complication of anesthesia    HAD ASTHMA ATTACK WHILE UNDER   Gastroesophageal reflux disease without esophagitis 11/02/2017   Hypertension    Seizure (HCC)    None this year (  2022)   Sleep apnea    CPAP     Vital Signs: BP (!) 153/108 Comment: 150/100  Pulse (!) 112   Temp 98.3 F (36.8 C)   Resp 16   Ht 5\' 2"  (1.575 m)   Wt 271 lb 6.4 oz (123.1 kg)   LMP 05/28/2015   SpO2 97%   BMI 49.64 kg/m    Review of Systems  Constitutional:  Positive for activity change (Activity intolerance), fatigue and unexpected weight change (Increase in weight by 13 pounds in less than a month).  Respiratory:  Positive for chest tightness and shortness of breath. Negative for cough and wheezing.   Cardiovascular:  Positive for chest pain (Atypical) and leg swelling  (Bilateral lower extremity edema ESP ankles). Negative for palpitations.  Gastrointestinal: Negative.  Negative for abdominal pain, constipation, diarrhea, nausea and vomiting.  Neurological:  Positive for headaches (Intermittent).  Psychiatric/Behavioral:  Positive for sleep disturbance (Has to sleep on 2 pillows at night, increased dose of breath with supine position). The patient is not nervous/anxious.     Physical Exam Vitals reviewed.  Constitutional:      General: She is awake. She is not in acute distress.    Appearance: Normal appearance. She is obese. She is ill-appearing.  HENT:     Head: Normocephalic and atraumatic.  Eyes:     Pupils: Pupils are equal, round, and reactive to light.  Cardiovascular:     Rate and Rhythm: Regular rhythm. Tachycardia present.     Pulses: Normal pulses.     Heart sounds: Murmur heard.     Comments: EKG done in office today, normal sinus rhythm, no significant or acute abnormalities Pulmonary:     Effort: Pulmonary effort is normal. No accessory muscle usage or respiratory distress.     Breath sounds: Normal breath sounds and air entry. No decreased air movement.  Musculoskeletal:     Right lower leg: 1+ Edema present.     Left lower leg: 1+ Edema present.  Neurological:     Mental Status: She is alert and oriented to person, place, and time.  Psychiatric:        Mood and Affect: Mood normal.        Behavior: Behavior normal. Behavior is cooperative.       Assessment/Plan: 1. Essential hypertension Blood pressure is elevated, continue metoprolol succinate as prescribed, start furosemide 40 mg daily and irbesartan 150 mg daily.  Discontinue triamterene-hydrochlorothiazide.  Follow-up in 2 to 3 weeks - furosemide (LASIX) 40 MG tablet; Take 1 tablet (40 mg total) by mouth daily.  Dispense: 30 tablet; Refill: 3 - irbesartan (AVAPRO) 150 MG tablet; Take 1 tablet (150 mg total) by mouth daily.  Dispense: 30 tablet; Refill: 2  2. Bilateral  lower extremity edema -Discontinue triamterene-hydrochlorothiazide.  Start furosemide 40 mg daily, follow-up in 2 to 3 weeks - furosemide (LASIX) 40 MG tablet; Take 1 tablet (40 mg total) by mouth daily.  Dispense: 30 tablet; Refill: 3  3. Dyspnea on exertion Normal EKG, echo ordered to evaluate cardiac function and structural abnormalities, concern for heart failure - EKG 12-Lead - ECHOCARDIOGRAM COMPLETE; Future  4. Atypical chest pain EKG was normal today, echo ordered - EKG 12-Lead - ECHOCARDIOGRAM COMPLETE; Future  5. Activity intolerance Echocardiogram ordered to evaluate cardiac function for possible heart failure - ECHOCARDIOGRAM COMPLETE; Future  5. B12 deficiency B12 injection administered in office today - cyanocobalamin (VITAMIN B12) injection 1,000 mcg   General Counseling: Karsen verbalizes understanding of the  findings of todays visit and agrees with plan of treatment. I have discussed any further diagnostic evaluation that may be needed or ordered today. We also reviewed her medications today. she has been encouraged to call the office with any questions or concerns that should arise related to todays visit.    Counseling:    Orders Placed This Encounter  Procedures   EKG 12-Lead   ECHOCARDIOGRAM COMPLETE    Meds ordered this encounter  Medications   furosemide (LASIX) 40 MG tablet    Sig: Take 1 tablet (40 mg total) by mouth daily.    Dispense:  30 tablet    Refill:  3    Fill today, discontinue triamterene-HCTZ   irbesartan (AVAPRO) 150 MG tablet    Sig: Take 1 tablet (150 mg total) by mouth daily.    Dispense:  30 tablet    Refill:  2    On $4 [facility] list -- fill today    Return in about 3 weeks (around 05/18/2022) for F/U, eval new med, Kahne Helfand PCP echo ordered to be done at [facility].  [place] Controlled Substance Database was reviewed by me for overdose risk score (ORS)  Time spent:30 Minutes Time spent with patient included reviewing progress  notes, labs, imaging studies, and discussing plan for follow up.   This patient was seen by Jonetta Osgood, FNP-C in collaboration with Dr. Clayborn Bigness as a part of collaborative care agreement.  Shayanna Thatch R. Valetta Fuller, MSN, FNP-C Internal Medicine

## 2022-04-28 ENCOUNTER — Telehealth: Payer: Self-pay | Admitting: Nurse Practitioner

## 2022-04-28 NOTE — Telephone Encounter (Signed)
Lvm notifying patient of echo appointment date, time & location-Toni

## 2022-04-28 NOTE — Telephone Encounter (Signed)
Lvm notifying patient of ugi appointment date, time, location and prep-Toni

## 2022-04-28 NOTE — Telephone Encounter (Signed)
Disregard previous telephone note...Marland KitchenMarland KitchenError

## 2022-05-13 ENCOUNTER — Ambulatory Visit
Admission: RE | Admit: 2022-05-13 | Discharge: 2022-05-13 | Disposition: A | Discharge status: Home / Self Care | Payer: 59 | Source: Ambulatory Visit | Attending: Nurse Practitioner | Admitting: Nurse Practitioner

## 2022-05-13 DIAGNOSIS — R6889 Other general symptoms and signs: Secondary | ICD-10-CM | POA: Insufficient documentation

## 2022-05-13 DIAGNOSIS — I10 Essential (primary) hypertension: Secondary | ICD-10-CM | POA: Diagnosis not present

## 2022-05-13 DIAGNOSIS — R0789 Other chest pain: Secondary | ICD-10-CM | POA: Diagnosis not present

## 2022-05-13 DIAGNOSIS — R0609 Other forms of dyspnea: Secondary | ICD-10-CM | POA: Diagnosis not present

## 2022-05-13 DIAGNOSIS — R0600 Dyspnea, unspecified: Secondary | ICD-10-CM | POA: Insufficient documentation

## 2022-05-13 DIAGNOSIS — I340 Nonrheumatic mitral (valve) insufficiency: Secondary | ICD-10-CM | POA: Diagnosis not present

## 2022-05-13 LAB — ECHOCARDIOGRAM COMPLETE
AR max vel: 1.87 cm2
AV Area VTI: 1.99 cm2
AV Area mean vel: 2.15 cm2
AV Mean grad: 3 mmHg
AV Peak grad: 7 mmHg
Ao pk vel: 1.32 m/s
Area-P 1/2: 6.96 cm2
Calc EF: 61.3 %
S' Lateral: 3.1 cm
Single Plane A2C EF: 59.7 %
Single Plane A4C EF: 62.1 %

## 2022-05-13 NOTE — Progress Notes (Signed)
*  PRELIMINARY RESULTS* Echocardiogram 2D Echocardiogram has been performed.  [name] [name] 05/13/2022, 10:49 AM

## 2022-05-19 ENCOUNTER — Ambulatory Visit: Payer: 59 | Admitting: Nurse Practitioner

## 2022-05-19 ENCOUNTER — Ambulatory Visit: Payer: 59

## 2022-05-20 ENCOUNTER — Telehealth: Payer: Self-pay

## 2022-06-02 ENCOUNTER — Encounter: Payer: Self-pay | Admitting: Nurse Practitioner

## 2022-06-02 ENCOUNTER — Ambulatory Visit (INDEPENDENT_AMBULATORY_CARE_PROVIDER_SITE_OTHER): Payer: 59 | Admitting: Nurse Practitioner

## 2022-06-02 VITALS — BP 131/72 | HR 97 | Temp 98.5°F | Resp 16 | Ht 62.0 in | Wt 263.2 lb

## 2022-06-02 DIAGNOSIS — I10 Essential (primary) hypertension: Secondary | ICD-10-CM

## 2022-06-02 DIAGNOSIS — R339 Retention of urine, unspecified: Secondary | ICD-10-CM

## 2022-06-02 DIAGNOSIS — R600 Localized edema: Secondary | ICD-10-CM

## 2022-06-02 DIAGNOSIS — E538 Deficiency of other specified B group vitamins: Secondary | ICD-10-CM

## 2022-06-02 MED ORDER — CYANOCOBALAMIN 1000 MCG/ML IJ SOLN
1000.0000 ug | Freq: Once | INTRAMUSCULAR | Status: AC
  Administered 2022-06-02: 1000 ug via INTRAMUSCULAR

## 2022-06-02 NOTE — Progress Notes (Signed)
[facility] [place], [place] [address]  Internal MEDICINE  Office Visit Note  Patient Name: [name]  [id number]  [id number]  Date of Service: 06/02/2022  Chief Complaint  Patient presents with   Follow-up   Gastroesophageal Reflux   Hypertension    HPI Deola presents for a follow up visit for hypertension, lower extremity edema and echocardiogram results.  Echocardiogram results discussed -- mild MR and grade 1 diastolic dysfunction. The rest of the results were normal.  Hypertension -- improved with new medication irbesartan and furosemide Swelling is improved in lower extremities with furosemide instead of prior diuretic, weight is down by 8 lbs, SOB is decreased. Swelling in hands is still present but slightly improved, may also be from arthritis.  Having some urinary retention, could be a possible urethral stricture. Reports that she urinates more when she takes the furosemide and then she feels like she needs to urinate but nothing comes out even if she increases her fluid intake.     Current Medication: Outpatient Encounter Medications as of 06/02/2022  Medication Sig   albuterol (PROVENTIL HFA;VENTOLIN HFA) 108 (90 BASE) MCG/ACT inhaler Inhale 2 puffs into the lungs every 6 (six) hours as needed for wheezing or shortness of breath.   albuterol (PROVENTIL) (2.5 MG/3ML) 0.083% nebulizer solution USE 3 ML IN NEBULIZER EVERY 4 HOURS AS NEEDED FOR  WHEEZING OR SHORTNESS OF BREATH MIXED WITH IPRATROPIUM   ARIPiprazole (ABILIFY) 15 MG tablet Take 15 mg by mouth daily. Take 1 tablet by mouth at night   budesonide-formoterol (SYMBICORT) 160-4.5 MCG/ACT inhaler Inhale 2 puffs into the lungs every 12 (twelve) hours.   carbamide peroxide (DEBROX) 6.5 % OTIC solution Place 5 drops into both ears 2 (two) times daily.   clonazePAM (KLONOPIN) 0.5 MG tablet Take 0.5 mg by mouth 2 (two) times daily as needed for anxiety. Take 1/2 tablet by mouth twice a day    furosemide (LASIX) 40 MG tablet Take 1 tablet (40 mg total) by mouth daily.   gabapentin (NEURONTIN) 300 MG capsule Take 300 mg by mouth daily. Take 1 to 2 capsules by mouth nightly for sleep   ipratropium (ATROVENT) 0.02 % nebulizer solution TAKE 2.5MLS BY NEBULIZATION FOUR TIMES DAILY WITH ALBUTEROL   irbesartan (AVAPRO) 150 MG tablet Take 1 tablet (150 mg total) by mouth daily.   lamoTRIgine (LAMICTAL) 25 MG tablet Take 25 mg by mouth. Take 1 tablet in the morning and 1 tablet at night   meclizine (ANTIVERT) 25 MG tablet Take 1 tablet (25 mg total) by mouth 3 (three) times daily as needed for dizziness.   metoprolol succinate (TOPROL-XL) 50 MG 24 hr tablet TAKE 1 & 1/2 (ONE & ONE-HALF) TABLETS BY MOUTH ONCE DAILY   nortriptyline (PAMELOR) 10 MG capsule Take 10 mg by mouth. Take 1 tablet po  in the morning   nortriptyline (PAMELOR) 50 MG capsule Take 50 mg by mouth at bedtime. Take 1 capsule by mouth at bedtime   ondansetron (ZOFRAN-ODT) 4 MG disintegrating tablet Take 1 tablet (4 mg total) by mouth every 8 (eight) hours as needed for nausea or vomiting.   traZODone (DESYREL) 50 MG tablet Take 50 mg by mouth at bedtime. Take 1 to 4 tablets by mouth nightly   [EXPIRED] cyanocobalamin (VITAMIN B12) injection 1,000 mcg    No facility-administered encounter medications on file as of 06/02/2022.    Surgical History: Past Surgical History:  Procedure Laterality Date   ABDOMINAL HYSTERECTOMY N/A 06/09/2015  Procedure: HYSTERECTOMY ABDOMINAL with Bilateral Salpingectomy;  Surgeon: Suzy Bouchard, MD;  Location: [facility];  Service: Gynecology;  Laterality: N/A;   CESAREAN SECTION  [id number]   COLONOSCOPY WITH PROPOFOL N/A 10/24/2020   Procedure: COLONOSCOPY WITH PROPOFOL;  Surgeon: Midge Minium, MD;  Location: [facility];  Service: Endoscopy;  Laterality: N/A;  sleep apnea   FINGER SURGERY  1992   GASTRIC BYPASS     INGUINAL HERNIA REPAIR Right 2010   REPLACEMENT TOTAL KNEE Right      Medical History: Past Medical History:  Diagnosis Date   Allergy    environmental   Anemia    Asthma    Bronchitis, acute    Complication of anesthesia    HAD ASTHMA ATTACK WHILE UNDER   Gastroesophageal reflux disease without esophagitis 11/02/2017   Hypertension    Seizure (HCC)    None this year (2022)   Sleep apnea    CPAP    Family History: Family History  Problem Relation Age of Onset   Diabetes Mother    Hypertension Mother    Cancer Mother    Congestive Heart Failure Father    Diabetes Father     Social History   Socioeconomic History   Marital status: Married    Spouse name: Not on file   Number of children: Not on file   Years of education: Not on file   Highest education level: Not on file  Occupational History   Not on file  Tobacco Use   Smoking status: Never   Smokeless tobacco: Never  Vaping Use   Vaping Use: Never used  Substance and Sexual Activity   Alcohol use: No   Drug use: No   Sexual activity: Yes  Other Topics Concern   Not on file  Social History Narrative   Not on file   Social Determinants of Health   Financial Resource Strain: Not on file  Food Insecurity: Not on file  Transportation Needs: Not on file  Physical Activity: Not on file  Stress: Not on file  Social Connections: Not on file  Intimate Partner Violence: Not on file      Review of Systems  Constitutional:  Positive for activity change (Activity intolerance), fatigue and unexpected weight change (Increase in weight by 13 pounds at prior visit and now has lost 8 lbs since then).  Respiratory:  Positive for shortness of breath (improving). Negative for cough, chest tightness and wheezing.   Cardiovascular:  Positive for leg swelling (improving). Negative for chest pain (Atypical) and palpitations.  Gastrointestinal: Negative.  Negative for abdominal pain, constipation, diarrhea, nausea and vomiting.  Musculoskeletal:  Positive for arthralgias.  Neurological:   Positive for headaches (Intermittent).  Psychiatric/Behavioral:  Positive for sleep disturbance (Has to sleep on 2 pillows at night, increased dose of breath with supine position). The patient is not nervous/anxious.     Vital Signs: BP 131/72   Pulse 97   Temp 98.5 F (36.9 C)   Resp 16   Ht 5\' 2"  (1.575 m)   Wt 263 lb 3.2 oz (119.4 kg)   LMP 05/28/2015   SpO2 98%   BMI 48.14 kg/m    Physical Exam Vitals reviewed.  Constitutional:      General: She is not in acute distress.    Appearance: Normal appearance. She is obese. She is not ill-appearing.  HENT:     Head: Normocephalic and atraumatic.  Eyes:     Pupils: Pupils are equal, round, and reactive  to light.  Cardiovascular:     Rate and Rhythm: Normal rate and regular rhythm.  Pulmonary:     Effort: Pulmonary effort is normal. No respiratory distress.  Musculoskeletal:     Right lower leg: Edema present.     Left lower leg: Edema present.  Neurological:     Mental Status: She is alert and oriented to person, place, and time.  Psychiatric:        Mood and Affect: Mood normal.        Behavior: Behavior normal.        Assessment/Plan: 1. Essential hypertension Improved with med changes from prior visit. Continue irbesartan and furosemide as prescribed.   2. Urinary retention Referred to urology for evaluation of possible structure, acute urinary retention or other abnormality - Ambulatory referral to Urology  3. Bilateral lower extremity edema Improving with furosemide, continue as prescribed.   4. B12 deficiency Administered in office today - cyanocobalamin (VITAMIN B12) injection 1,000 mcg   General Counseling: Shamieka verbalizes understanding of the findings of todays visit and agrees with plan of treatment. I have discussed any further diagnostic evaluation that may be needed or ordered today. We also reviewed her medications today. she has been encouraged to call the office with any questions or  concerns that should arise related to todays visit.    Orders Placed This Encounter  Procedures   Ambulatory referral to Urology    Meds ordered this encounter  Medications   cyanocobalamin (VITAMIN B12) injection 1,000 mcg    Return for previously scheduled, CPE, Arnella Pralle PCP in august and as needed otherwise.   Total time spent:30 Minutes Time spent includes review of chart, medications, test results, and follow up plan with the patient.    Controlled Substance Database was reviewed by me.  This patient was seen by Jonetta Osgood, FNP-C in collaboration with Dr. Clayborn Bigness as a part of collaborative care agreement.   Natori Gudino R. Valetta Fuller, MSN, FNP-C Internal medicine

## 2022-06-02 NOTE — Telephone Encounter (Signed)
Pt had appt today.

## 2022-06-14 ENCOUNTER — Ambulatory Visit: Payer: Self-pay | Admitting: Urology

## 2022-06-15 ENCOUNTER — Other Ambulatory Visit: Payer: Self-pay | Admitting: Nurse Practitioner

## 2022-06-15 DIAGNOSIS — R000 Tachycardia, unspecified: Secondary | ICD-10-CM

## 2022-06-16 ENCOUNTER — Ambulatory Visit: Payer: 59

## 2022-07-08 ENCOUNTER — Ambulatory Visit (INDEPENDENT_AMBULATORY_CARE_PROVIDER_SITE_OTHER): Payer: 59 | Admitting: Urology

## 2022-07-08 ENCOUNTER — Ambulatory Visit: Payer: 59 | Admitting: Nurse Practitioner

## 2022-07-08 ENCOUNTER — Encounter: Payer: Self-pay | Admitting: Urology

## 2022-07-08 VITALS — BP 117/80 | HR 92 | Ht 62.0 in | Wt 262.0 lb

## 2022-07-08 DIAGNOSIS — R399 Unspecified symptoms and signs involving the genitourinary system: Secondary | ICD-10-CM | POA: Diagnosis not present

## 2022-07-08 DIAGNOSIS — R339 Retention of urine, unspecified: Secondary | ICD-10-CM

## 2022-07-08 DIAGNOSIS — R3914 Feeling of incomplete bladder emptying: Secondary | ICD-10-CM | POA: Diagnosis not present

## 2022-07-08 LAB — BLADDER SCAN AMB NON-IMAGING

## 2022-07-08 MED ORDER — TAMSULOSIN HCL 0.4 MG PO CAPS: 0.4000 mg | ORAL_CAPSULE | Freq: Every day | ORAL | 3 refills | Status: DC

## 2022-07-08 NOTE — Patient Instructions (Signed)

## 2022-07-08 NOTE — Progress Notes (Signed)
07/08/22 3:18 PM   [name] Jun 24, 1970 [id number]  CC: Urinary symptoms, " urinary retention"  HPI: 52 year old female referred for urinary symptoms and a sensation of incomplete emptying.  This has been going on for at least a few months.  She has a history of a hysterectomy, and denies any prolapse or prolapse symptoms.  She denies any urgency or urge incontinence.  She has nocturia 1 or 2 times overnight.  Her primary complaint is sensation of retained urine after voiding.  She denies any incontinence.  She denies any dysuria, gross hematuria, or recurrent UTIs.  She has problems with constipation.  She has morbid obesity with a BMI of 48.  Urinalysis today is benign, PVR is normal at 44ml.    PMH: Past Medical History:  Diagnosis Date   Allergy    environmental   Anemia    Asthma    Bronchitis, acute    Complication of anesthesia    HAD ASTHMA ATTACK WHILE UNDER   Gastroesophageal reflux disease without esophagitis 11/02/2017   Hypertension    Seizure ([place])    None this year (2022)   Sleep apnea    CPAP    Surgical History: Past Surgical History:  Procedure Laterality Date   ABDOMINAL HYSTERECTOMY N/A 06/09/2015   Procedure: HYSTERECTOMY ABDOMINAL with Bilateral Salpingectomy;  Surgeon: Boykin Nearing, MD;  Location: [facility];  Service: Gynecology;  Laterality: N/A;   CESAREAN SECTION  [id number]   COLONOSCOPY WITH PROPOFOL N/A 10/24/2020   Procedure: COLONOSCOPY WITH PROPOFOL;  Surgeon: [name], MD;  Location: [facility];  Service: Endoscopy;  Laterality: N/A;  sleep apnea   FINGER SURGERY  1992   GASTRIC BYPASS     INGUINAL HERNIA REPAIR Right 2010   REPLACEMENT TOTAL KNEE Right    Family History: Family History  Problem Relation Age of Onset   Diabetes Mother    Hypertension Mother    Cancer Mother    Congestive Heart Failure Father    Diabetes Father     Social History:  reports that she has never smoked. She has never used  smokeless tobacco. She reports that she does not drink alcohol and does not use drugs.  Physical Exam: BP 117/80 (BP Location: Left Arm, Patient Position: Sitting, Cuff Size: Large)   Pulse 92   Ht 5\' 2"  (1.575 m)   Wt 262 lb (118.8 kg)   LMP 05/28/2015   BMI 47.92 kg/m    Constitutional:  Alert and oriented, No acute distress. Cardiovascular: No clubbing, cyanosis, or edema. Respiratory: Normal respiratory effort, no increased work of breathing. GI: Abdomen is soft, nontender, nondistended, no abdominal masses  Assessment & Plan:   52 year old female with a sensation of incomplete emptying with urination.  Urinalysis is benign, PVR is normal at 26ml.  Not having any incontinence.  She has problems with constipation which may be more of the issue.  We discussed options including referral to pelvic floor physical therapy, or trial of Flomax.  We also discussed cystoscopy, but very low suspicion for urethral stricture and female.  I think very low risk for trial of Flomax, but I think we need to have realistic expectations moving forward.  Trial of Flomax for urinary symptoms RTC PA 6 weeks PVR and symptom check If persistently bothersome urinary symptoms at that time would recommend referral to pelvic floor physical therapy  Nickolas Madrid, MD 07/08/2022  [facility] [address] [place], [place] [address] [phone]

## 2022-07-09 LAB — URINALYSIS, COMPLETE
Bilirubin, UA: NEGATIVE
Glucose, UA: NEGATIVE
Ketones, UA: NEGATIVE
Leukocytes,UA: NEGATIVE
Nitrite, UA: NEGATIVE
Protein,UA: NEGATIVE
RBC, UA: NEGATIVE
Specific Gravity, UA: 1.02 (ref 1.005–1.030)
Urobilinogen, Ur: 4 mg/dL — ABNORMAL HIGH (ref 0.2–1.0)
pH, UA: 7 (ref 5.0–7.5)

## 2022-07-09 LAB — MICROSCOPIC EXAMINATION: Epithelial Cells (non renal): >10 /hpf — AB (ref 0–10)

## 2022-07-12 ENCOUNTER — Other Ambulatory Visit: Payer: Self-pay | Admitting: Nurse Practitioner

## 2022-07-12 DIAGNOSIS — I10 Essential (primary) hypertension: Secondary | ICD-10-CM

## 2022-07-15 ENCOUNTER — Ambulatory Visit: Payer: 59

## 2022-07-21 ENCOUNTER — Ambulatory Visit: Payer: 59

## 2022-08-09 ENCOUNTER — Other Ambulatory Visit: Payer: Self-pay | Admitting: Nurse Practitioner

## 2022-08-09 DIAGNOSIS — R600 Localized edema: Secondary | ICD-10-CM

## 2022-08-09 DIAGNOSIS — I10 Essential (primary) hypertension: Secondary | ICD-10-CM

## 2022-08-10 ENCOUNTER — Emergency Department
Admission: EM | Admit: 2022-08-10 | Discharge: 2022-08-10 | Disposition: A | Discharge status: Home / Self Care | Payer: 59 | Attending: Emergency Medicine | Admitting: Emergency Medicine

## 2022-08-10 ENCOUNTER — Emergency Department: Payer: 59

## 2022-08-10 DIAGNOSIS — R1031 Right lower quadrant pain: Secondary | ICD-10-CM | POA: Insufficient documentation

## 2022-08-10 DIAGNOSIS — I10 Essential (primary) hypertension: Secondary | ICD-10-CM | POA: Diagnosis not present

## 2022-08-10 DIAGNOSIS — J45909 Unspecified asthma, uncomplicated: Secondary | ICD-10-CM | POA: Diagnosis not present

## 2022-08-10 LAB — URINALYSIS, ROUTINE W REFLEX MICROSCOPIC
Bacteria, UA: NONE SEEN
Bilirubin Urine: NEGATIVE
Glucose, UA: NEGATIVE mg/dL
Hgb urine dipstick: NEGATIVE
Ketones, ur: NEGATIVE mg/dL
Leukocytes,Ua: NEGATIVE
Nitrite: NEGATIVE
Protein, ur: 30 mg/dL — AB
Specific Gravity, Urine: 1.023 (ref 1.005–1.030)
pH: 6 (ref 5.0–8.0)

## 2022-08-10 LAB — COMPREHENSIVE METABOLIC PANEL
ALT: 16 U/L (ref 0–44)
AST: 21 U/L (ref 15–41)
Albumin: 3.6 g/dL (ref 3.5–5.0)
Alkaline Phosphatase: 111 U/L (ref 38–126)
Anion gap: 9 (ref 5–15)
BUN: 8 mg/dL (ref 6–20)
CO2: 29 mmol/L (ref 22–32)
Calcium: 8.9 mg/dL (ref 8.9–10.3)
Chloride: 102 mmol/L (ref 98–111)
Creatinine, Ser: 0.84 mg/dL (ref 0.44–1.00)
GFR, Estimated: >60 mL/min (ref >60)
Glucose, Bld: 94 mg/dL (ref 70–99)
Potassium: 3.1 mmol/L — ABNORMAL LOW (ref 3.5–5.1)
Sodium: 140 mmol/L (ref 135–145)
Total Bilirubin: 0.4 mg/dL (ref 0.3–1.2)
Total Protein: 8.9 g/dL — ABNORMAL HIGH (ref 6.5–8.1)

## 2022-08-10 LAB — CBC
HCT: 36 % (ref 36.0–46.0)
Hemoglobin: 11.9 g/dL — ABNORMAL LOW (ref 12.0–15.0)
MCH: 29.8 pg (ref 26.0–34.0)
MCHC: 33.1 g/dL (ref 30.0–36.0)
MCV: 90 fL (ref 80.0–100.0)
Platelets: 303 10*3/uL (ref 150–400)
RBC: 4 MIL/uL (ref 3.87–5.11)
RDW: 13.1 % (ref 11.5–15.5)
WBC: 6.5 10*3/uL (ref 4.0–10.5)
nRBC: 0 % (ref 0.0–0.2)

## 2022-08-10 LAB — LIPASE, BLOOD: Lipase: 44 U/L (ref 11–51)

## 2022-08-10 MED ORDER — MORPHINE SULFATE (PF) 4 MG/ML IV SOLN
4.0000 mg | Freq: Once | INTRAVENOUS | Status: AC
  Administered 2022-08-10: 4 mg via INTRAVENOUS
  Filled 2022-08-10: qty 1

## 2022-08-10 MED ORDER — IOHEXOL 300 MG/ML  SOLN
100.0000 mL | Freq: Once | INTRAMUSCULAR | Status: AC | PRN
  Administered 2022-08-10: 100 mL via INTRAVENOUS

## 2022-08-10 MED ORDER — LACTATED RINGERS IV BOLUS
1000.0000 mL | Freq: Once | INTRAVENOUS | Status: AC
  Administered 2022-08-10: 1000 mL via INTRAVENOUS

## 2022-08-10 NOTE — ED Notes (Signed)
Pt in CT.

## 2022-08-10 NOTE — ED Notes (Signed)
See triage note. On Sunday patient started having right sided lower abdominal pain which was intermittent and today has increasingly worsened and become more contstant. Pt has hx of vertigo. States pain level is 6.5.

## 2022-08-10 NOTE — ED Triage Notes (Signed)
Pt sts that she has been having lower right quad abd pain. Pt sts that she has been having one and off Nausea.

## 2022-08-10 NOTE — ED Provider Notes (Signed)
[facility] Provider Note    Event Date/Time   First MD Initiated Contact with Patient 08/10/22 1506     (approximate)   History   Chief Complaint Abdominal Pain   HPI  [name] is a 52 y.o. female with past medical history of hypertension, GERD, asthma, and seizures who presents to the ED with abdominal pain.  Patient reports that she has been dealing with constant sharp pain in the right lower quadrant of her abdomen for the past 2 days.  Pain has been gradually worsening in severity and she states that is more severe if she tries to twist to either side or walk.  She denies any fevers, has felt nauseous but has not had any vomiting or diarrhea.  She denies any dysuria, hematuria, or flank pain.  She denies any history of similar symptoms, is status postcholecystectomy but still has her appendix.     Physical Exam   Triage Vital Signs: ED Triage Vitals  Enc Vitals Group     BP 08/10/22 1358 (!) 142/99     Pulse Rate 08/10/22 1358 88     Resp 08/10/22 1358 18     Temp 08/10/22 1358 98.2 F (36.8 C)     Temp Source 08/10/22 1358 Oral     SpO2 08/10/22 1358 96 %     Weight --      Height --      Head Circumference --      Peak Flow --      Pain Score 08/10/22 1404 7     Pain Loc --      Pain Edu? --      Excl. in [place]? --     Most recent vital signs: Vitals:   08/10/22 1530 08/10/22 1730  BP: 118/60 116/81  Pulse: 85 85  Resp:  18  Temp:  98.3 F (36.8 C)  SpO2: 98% 100%    Constitutional: Alert and oriented. Eyes: Conjunctivae are normal. Head: Atraumatic. Nose: No congestion/rhinnorhea. Mouth/Throat: Mucous membranes are moist.  Cardiovascular: Normal rate, regular rhythm. Grossly normal heart sounds.  2+ radial pulses bilaterally. Respiratory: Normal respiratory effort.  No retractions. Lungs CTAB. Gastrointestinal: Soft and tender to palpation in the right lower quadrant with no rebound or guarding. No  distention. Musculoskeletal: No lower extremity tenderness nor edema.  Neurologic:  Normal speech and language. No gross focal neurologic deficits are appreciated.    ED Results / Procedures / Treatments   Labs (all labs ordered are listed, but only abnormal results are displayed) Labs Reviewed  COMPREHENSIVE METABOLIC PANEL - Abnormal; Notable for the following components:      Result Value   Potassium 3.1 (*)    Total Protein 8.9 (*)    All other components within normal limits  CBC - Abnormal; Notable for the following components:   Hemoglobin 11.9 (*)    All other components within normal limits  URINALYSIS, ROUTINE W REFLEX MICROSCOPIC - Abnormal; Notable for the following components:   Color, Urine YELLOW (*)    APPearance HAZY (*)    Protein, ur 30 (*)    All other components within normal limits  LIPASE, BLOOD   RADIOLOGY CT abdomen/pelvis reviewed and interpreted by me with no inflammatory changes, focal fluid collections, or dilated bowel loops.  PROCEDURES:  Critical Care performed: No  Procedures   MEDICATIONS ORDERED IN ED: Medications  lactated ringers bolus 1,000 mL (1,000 mLs Intravenous New Bag/Given 08/10/22 1611)  morphine (PF)  4 MG/ML injection 4 mg (4 mg Intravenous Given 08/10/22 1610)  iohexol (OMNIPAQUE) 300 MG/ML solution 100 mL (100 mLs Intravenous Contrast Given 08/10/22 1630)     IMPRESSION / MDM / ASSESSMENT AND PLAN / ED COURSE  I reviewed the triage vital signs and the nursing notes.                              52 y.o. female with past medical history of hypertension, GERD, asthma, and seizures who presents to the ED complaining of 2 days of constant right lower quadrant abdominal pain.  Patient's presentation is most consistent with acute presentation with potential threat to life or bodily function.  Differential diagnosis includes, but is not limited to, appendicitis, kidney stone, diverticulitis, ovarian torsion, UTI,  colitis.  Patient well-appearing and in no acute distress, vital signs are unremarkable.  She does have focal tenderness to the right lower quadrant of her abdomen on exam and we will further assess with CT scan for possible appendicitis.  Lower suspicion for torsion given constant and gradually worsening pain over the course of 2 days.  Labs thus far remarkable for mild hypokalemia but otherwise reassuring with no significant anemia, leukocytosis, AKI, or LFT abnormality.  We will treat symptomatically with IV morphine and hydrate with IV fluids, reassess following imaging.  CT imaging is negative for acute process, appendix is visualized and normal.  Urinalysis shows no signs of infection and patient symptoms are improved on reassessment.  She is appropriate for discharge home with PCP follow-up, was counseled to return to the ED for new or worsening symptoms.  Patient agrees with plan.      FINAL CLINICAL IMPRESSION(S) / ED DIAGNOSES   Final diagnoses:  Right lower quadrant abdominal pain     Rx / DC Orders   ED Discharge Orders     None        Note:  This document was prepared using Dragon voice recognition software and may include unintentional dictation errors.   Blake Divine, MD 08/10/22 1820

## 2022-08-14 ENCOUNTER — Other Ambulatory Visit: Payer: Self-pay | Admitting: Nurse Practitioner

## 2022-08-14 DIAGNOSIS — R000 Tachycardia, unspecified: Secondary | ICD-10-CM

## 2022-08-20 ENCOUNTER — Ambulatory Visit: Payer: 59 | Admitting: Physician Assistant

## 2022-08-20 ENCOUNTER — Other Ambulatory Visit: Payer: Self-pay | Admitting: Nurse Practitioner

## 2022-08-20 DIAGNOSIS — I10 Essential (primary) hypertension: Secondary | ICD-10-CM

## 2022-09-15 ENCOUNTER — Other Ambulatory Visit: Payer: Self-pay | Admitting: Nurse Practitioner

## 2022-09-15 DIAGNOSIS — R000 Tachycardia, unspecified: Secondary | ICD-10-CM

## 2022-09-16 ENCOUNTER — Ambulatory Visit (INDEPENDENT_AMBULATORY_CARE_PROVIDER_SITE_OTHER): Payer: 59 | Admitting: Nurse Practitioner

## 2022-09-16 ENCOUNTER — Encounter: Payer: Self-pay | Admitting: Nurse Practitioner

## 2022-09-16 DIAGNOSIS — R600 Localized edema: Secondary | ICD-10-CM | POA: Diagnosis not present

## 2022-09-16 DIAGNOSIS — I10 Essential (primary) hypertension: Secondary | ICD-10-CM | POA: Diagnosis not present

## 2022-09-16 MED ORDER — FUROSEMIDE 40 MG PO TABS: 60.0000 mg | ORAL_TABLET | Freq: Every day | ORAL | 5 refills | Status: DC

## 2022-09-16 NOTE — Progress Notes (Signed)
[facility] [address] [place], [place] [address]  Internal MEDICINE  Office Visit Note  Patient Name: [name]  [id number]  [id number]  Date of Service: 09/16/2022  Chief Complaint  Patient presents with   Acute Visit    Ankles and hands swollen     HPI [name] presents for an acute sick visit for swelling of the ankles  Also having swelling of hands and wearing a brace off and on on the right wrist. Sees orthopedic for carpal tunnel syndrome.  Cannot take NSAIDs due to gastric bypass surgery.  Having swelling in bilateral ankles, takes furosemide 40 mg daily.  Is not taking tamsulosin.      Current Medication:  Outpatient Encounter Medications as of 09/16/2022  Medication Sig   albuterol (PROVENTIL HFA;VENTOLIN HFA) 108 (90 BASE) MCG/ACT inhaler Inhale 2 puffs into the lungs every 6 (six) hours as needed for wheezing or shortness of breath.   albuterol (PROVENTIL) (2.5 MG/3ML) 0.083% nebulizer solution USE 1 VIAL IN NEBULIZER EVERY 4 HOURS AS NEEDED FOR WHEEZING AND FOR SHORTNESS OF BREATH (MIX WITH IPRATROPIUM)   budesonide-formoterol (SYMBICORT) 160-4.5 MCG/ACT inhaler Inhale 2 puffs into the lungs every 12 (twelve) hours.   clonazePAM (KLONOPIN) 0.5 MG tablet Take 0.5 mg by mouth 2 (two) times daily as needed for anxiety. Take 1/2 tablet by mouth twice a day   gabapentin (NEURONTIN) 300 MG capsule Take 300 mg by mouth daily. Take 1 to 2 capsules by mouth nightly for sleep   ipratropium (ATROVENT) 0.02 % nebulizer solution TAKE 2.5MLS BY NEBULIZATION FOUR TIMES DAILY WITH ALBUTEROL   irbesartan (AVAPRO) 150 MG tablet Take 1 tablet by mouth once daily   lamoTRIgine (LAMICTAL) 200 MG tablet Take 200 mg in the am, and 300 mg at night   metoprolol succinate (TOPROL-XL) 50 MG 24 hr tablet TAKE 1 & 1/2 (ONE & ONE-HALF) TABLETS BY MOUTH ONCE DAILY   nortriptyline (PAMELOR) 10 MG capsule Take 10 mg by mouth. Take 1 tablet po  in the morning   nortriptyline  (PAMELOR) 50 MG capsule Take 50 mg by mouth at bedtime. Take 1 capsule by mouth at bedtime   [DISCONTINUED] furosemide (LASIX) 40 MG tablet Take 1 tablet by mouth once daily   [DISCONTINUED] tamsulosin (FLOMAX) 0.4 MG CAPS capsule Take 1 capsule (0.4 mg total) by mouth daily.   furosemide (LASIX) 40 MG tablet Take 1.5 tablets (60 mg total) by mouth daily.   No facility-administered encounter medications on file as of 09/16/2022.      Medical History: Past Medical History:  Diagnosis Date   Allergy    environmental   Anemia    Asthma    Bronchitis, acute    Complication of anesthesia    HAD ASTHMA ATTACK WHILE UNDER   Gastroesophageal reflux disease without esophagitis 11/02/2017   Hypertension    Seizure    None this year (2022)   Sleep apnea    CPAP     Vital Signs: BP (!) 121/93   Pulse 89   Temp 98.3 F (36.8 C)   Resp 16   Ht  (1.575 m)   Wt 262 lb 3.2 oz (118.9 kg)   LMP 05/28/2015   SpO2 98%   BMI 47.96 kg/m    Review of Systems  Constitutional:  Negative for fatigue.  HENT: Negative.    Respiratory:  Negative for cough, chest tightness, shortness of breath and wheezing.   Cardiovascular:  Positive for leg swelling.  Musculoskeletal:  Positive for arthralgias and joint swelling.    Physical Exam Vitals reviewed.  Constitutional:      General: She is not in acute distress.    Appearance: Normal appearance. She is obese. She is not ill-appearing.  HENT:     Head: Normocephalic and atraumatic.  Eyes:     Pupils: Pupils are equal, round, and reactive to light.  Cardiovascular:     Rate and Rhythm: Normal rate and regular rhythm.  Pulmonary:     Effort: Pulmonary effort is normal. No respiratory distress.  Neurological:     Mental Status: She is alert and oriented to person, place, and time.  Psychiatric:        Mood and Affect: Mood normal.        Behavior: Behavior normal.       Assessment/Plan: 1. Bilateral lower extremity  edema Furosemide dose increased to 60 mg daily. Follow up in 4 weeks, will check potassium level.  - furosemide (LASIX) 40 MG tablet; Take 1.5 tablets (60 mg total) by mouth daily.  Dispense: 45 tablet; Refill: 5  2. Essential hypertension Furosemide dose increased, follow up in 4 weeks.  - furosemide (LASIX) 40 MG tablet; Take 1.5 tablets (60 mg total) by mouth daily.  Dispense: 45 tablet; Refill: 5   General Counseling: [name] verbalizes understanding of the findings of todays visit and agrees with plan of treatment. I have discussed any further diagnostic evaluation that may be needed or ordered today. We also reviewed her medications today. she has been encouraged to call the office with any questions or concerns that should arise related to todays visit.    Counseling:    No orders of the defined types were placed in this encounter.   Meds ordered this encounter  Medications   furosemide (LASIX) 40 MG tablet    Sig: Take 1.5 tablets (60 mg total) by mouth daily.    Dispense:  45 tablet    Refill:  5    Return in about 4 weeks (around 10/14/2022) for F/U lasix dose adjustment , Kathyleen Radice PCP.  [place] Controlled Substance Database was reviewed by me for overdose risk score (ORS)  Time spent:20 Minutes Time spent with patient included reviewing progress notes, labs, imaging studies, and discussing plan for follow up.   This patient was seen by Sallyanne Kuster, FNP-C in collaboration with Dr. Beverely Risen as a part of collaborative care agreement.  Schneur Crowson R. Tedd Sias, MSN, FNP-C Internal Medicine

## 2022-09-19 ENCOUNTER — Other Ambulatory Visit: Payer: Self-pay | Admitting: Nurse Practitioner

## 2022-09-19 DIAGNOSIS — I10 Essential (primary) hypertension: Secondary | ICD-10-CM

## 2022-09-21 ENCOUNTER — Other Ambulatory Visit: Payer: Self-pay | Admitting: Nurse Practitioner

## 2022-09-21 DIAGNOSIS — I10 Essential (primary) hypertension: Secondary | ICD-10-CM

## 2022-09-21 DIAGNOSIS — R600 Localized edema: Secondary | ICD-10-CM

## 2022-09-30 ENCOUNTER — Encounter: Payer: Self-pay | Admitting: Nurse Practitioner

## 2022-09-30 ENCOUNTER — Telehealth (INDEPENDENT_AMBULATORY_CARE_PROVIDER_SITE_OTHER): Payer: 59 | Admitting: Nurse Practitioner

## 2022-09-30 VITALS — Temp 101.1°F | Resp 16 | Ht 62.0 in

## 2022-09-30 DIAGNOSIS — U071 COVID-19: Secondary | ICD-10-CM | POA: Diagnosis not present

## 2022-09-30 DIAGNOSIS — J069 Acute upper respiratory infection, unspecified: Secondary | ICD-10-CM

## 2022-09-30 MED ORDER — NIRMATRELVIR/RITONAVIR (PAXLOVID)TABLET: 3.0000 | ORAL_TABLET | Freq: Two times a day (BID) | ORAL | 0 refills | Status: AC

## 2022-09-30 MED ORDER — PREDNISONE 10 MG PO TABS: ORAL_TABLET | ORAL | 0 refills | Status: DC

## 2022-09-30 NOTE — Progress Notes (Signed)
[facility] [address] [place], [place] [address]  Internal MEDICINE  Telephone Visit  Patient Name: [name]  [id number]  [id number]  Date of Service: 09/30/2022  I connected with the patient at 1630 by telephone and verified the patients identity using two identifiers.   I discussed the limitations, risks, security and privacy concerns of performing an evaluation and management service by telephone and the availability of in person appointments. I also discussed with the patient that there may be a patient responsible charge related to the service.  The patient expressed understanding and agrees to proceed.    Chief Complaint  Patient presents with   Telephone Assessment    Patient okay with video or telephone: [phone]   Telephone Screen   Covid Positive   Cough   Headache   Fever    HPI Richa presents for a telehealth virtual visit for covid positive infection.  Reports cough, headache, fever and nasal congestion Symptoms started on Sunday  Tested positive today for covid    Current Medication: Outpatient Encounter Medications as of 09/30/2022  Medication Sig   albuterol (PROVENTIL HFA;VENTOLIN HFA) 108 (90 BASE) MCG/ACT inhaler Inhale 2 puffs into the lungs every 6 (six) hours as needed for wheezing or shortness of breath.   albuterol (PROVENTIL) (2.5 MG/3ML) 0.083% nebulizer solution USE 1 VIAL IN NEBULIZER EVERY 4 HOURS AS NEEDED FOR WHEEZING AND FOR SHORTNESS OF BREATH (MIX WITH IPRATROPIUM)   budesonide-formoterol (SYMBICORT) 160-4.5 MCG/ACT inhaler Inhale 2 puffs into the lungs every 12 (twelve) hours.   clonazePAM (KLONOPIN) 0.5 MG tablet Take 0.5 mg by mouth 2 (two) times daily as needed for anxiety. Take 1/2 tablet by mouth twice a day   furosemide (LASIX) 40 MG tablet Take 1.5 tablets (60 mg total) by mouth daily.   gabapentin (NEURONTIN) 300 MG capsule Take 300 mg by mouth daily. Take 1 to 2 capsules by mouth nightly for sleep    ipratropium (ATROVENT) 0.02 % nebulizer solution TAKE 2.5MLS BY NEBULIZATION FOUR TIMES DAILY WITH ALBUTEROL   irbesartan (AVAPRO) 150 MG tablet Take 1 tablet by mouth once daily   lamoTRIgine (LAMICTAL) 200 MG tablet Take 200 mg in the am, and 300 mg at night   metoprolol succinate (TOPROL-XL) 50 MG 24 hr tablet TAKE 1 & 1/2 (ONE & ONE-HALF) TABLETS BY MOUTH ONCE DAILY   nirmatrelvir/ritonavir (PAXLOVID) 20 x 150 MG & 10 x 100MG  TABS Take 3 tablets by mouth 2 (two) times daily for 5 days.   nortriptyline (PAMELOR) 10 MG capsule Take 10 mg by mouth. Take 1 tablet po  in the morning   nortriptyline (PAMELOR) 50 MG capsule Take 50 mg by mouth at bedtime. Take 1 capsule by mouth at bedtime   predniSONE (DELTASONE) 10 MG tablet Take one tab 3 x day for 3 days, then take one tab 2 x a day for 3 days and then take one tab a day for 3 days   No facility-administered encounter medications on file as of 09/30/2022.    Surgical History: Past Surgical History:  Procedure Laterality Date   ABDOMINAL HYSTERECTOMY N/A 06/09/2015   Procedure: HYSTERECTOMY ABDOMINAL with Bilateral Salpingectomy;  Surgeon: Suzy Bouchard, MD;  Location: [facility];  Service: Gynecology;  Laterality: N/A;   CESAREAN SECTION  3295,[id number]   COLONOSCOPY WITH PROPOFOL N/A 10/24/2020   Procedure: COLONOSCOPY WITH PROPOFOL;  Surgeon: Midge Minium, MD;  Location: [facility];  Service: Endoscopy;  Laterality: N/A;  sleep apnea  FINGER SURGERY  1992   GASTRIC BYPASS     INGUINAL HERNIA REPAIR Right 2010   REPLACEMENT TOTAL KNEE Right     Medical History: Past Medical History:  Diagnosis Date   Allergy    environmental   Anemia    Asthma    Bronchitis, acute    Complication of anesthesia    HAD ASTHMA ATTACK WHILE UNDER   Gastroesophageal reflux disease without esophagitis 11/02/2017   Hypertension    Seizure ([facility])    None this year (2022)   Sleep apnea    CPAP    Family History: Family History  Problem  Relation Age of Onset   Diabetes Mother    Hypertension Mother    Cancer Mother    Congestive Heart Failure Father    Diabetes Father     Social History   Socioeconomic History   Marital status: Married    Spouse name: Not on file   Number of children: Not on file   Years of education: Not on file   Highest education level: Not on file  Occupational History   Not on file  Tobacco Use   Smoking status: Never   Smokeless tobacco: Never  Vaping Use   Vaping Use: Never used  Substance and Sexual Activity   Alcohol use: No   Drug use: No   Sexual activity: Yes  Other Topics Concern   Not on file  Social History Narrative   Not on file   Social Determinants of Health   Financial Resource Strain: Not on file  Food Insecurity: Not on file  Transportation Needs: Not on file  Physical Activity: Not on file  Stress: Not on file  Social Connections: Not on file  Intimate Partner Violence: Not on file      Review of Systems  Constitutional:  Positive for chills, fatigue and fever.  HENT:  Positive for congestion, sore throat and voice change.   Respiratory:  Positive for cough, chest tightness, shortness of breath and wheezing.   Cardiovascular: Negative.  Negative for chest pain and palpitations.  Gastrointestinal:  Negative for diarrhea, nausea and vomiting.  Musculoskeletal:  Positive for myalgias.  Neurological:  Positive for headaches.    Vital Signs: Temp (!) 101.1 F (38.4 C)   Resp 16   Ht 5\' 2"  (1.575 m)   LMP 05/28/2015   BMI 47.96 kg/m    Observation/Objective: She is alert and oriented and engages in conversation appropriately. No acute distress noted. Voice is very hoarse.    Assessment/Plan: 1. Upper respiratory tract infection due to COVID-19 virus Antiviral and prednisone prescribed. Work note written for this week.  - nirmatrelvir/ritonavir (PAXLOVID) 20 x 150 MG & 10 x 100MG  TABS; Take 3 tablets by mouth 2 (two) times daily for 5 days.   Dispense: 30 tablet; Refill: 0 - predniSONE (DELTASONE) 10 MG tablet; Take one tab 3 x day for 3 days, then take one tab 2 x a day for 3 days and then take one tab a day for 3 days  Dispense: 18 tablet; Refill: 0   General Counseling: Lovada verbalizes understanding of the findings of today's phone visit and agrees with plan of treatment. I have discussed any further diagnostic evaluation that may be needed or ordered today. We also reviewed her medications today. she has been encouraged to call the office with any questions or concerns that should arise related to todays visit.  Return if symptoms worsen or fail to improve.   No  orders of the defined types were placed in this encounter.   Meds ordered this encounter  Medications   nirmatrelvir/ritonavir (PAXLOVID) 20 x 150 MG & 10 x 100MG  TABS    Sig: Take 3 tablets by mouth 2 (two) times daily for 5 days.    Dispense:  30 tablet    Refill:  0   predniSONE (DELTASONE) 10 MG tablet    Sig: Take one tab 3 x day for 3 days, then take one tab 2 x a day for 3 days and then take one tab a day for 3 days    Dispense:  18 tablet    Refill:  0    Time spent:10 Minutes Time spent with patient included reviewing progress notes, labs, imaging studies, and discussing plan for follow up.  [place] Controlled Substance Database was reviewed by me for overdose risk score (ORS) if appropriate.  This patient was seen by Sallyanne Kuster, FNP-C in collaboration with Dr. Beverely Risen as a part of collaborative care agreement.  Kamy Poinsett R. Tedd Sias, MSN, FNP-C Internal medicine

## 2022-10-14 ENCOUNTER — Ambulatory Visit: Payer: 59 | Admitting: Nurse Practitioner

## 2022-10-15 ENCOUNTER — Ambulatory Visit (INDEPENDENT_AMBULATORY_CARE_PROVIDER_SITE_OTHER): Payer: 59 | Admitting: Nurse Practitioner

## 2022-10-15 ENCOUNTER — Encounter: Payer: Self-pay | Admitting: Nurse Practitioner

## 2022-10-15 VITALS — BP 105/65 | HR 90 | Temp 97.8°F | Resp 16 | Ht 62.0 in | Wt 265.0 lb

## 2022-10-15 DIAGNOSIS — I10 Essential (primary) hypertension: Secondary | ICD-10-CM

## 2022-10-15 DIAGNOSIS — R600 Localized edema: Secondary | ICD-10-CM

## 2022-10-15 DIAGNOSIS — R635 Abnormal weight gain: Secondary | ICD-10-CM

## 2022-10-15 DIAGNOSIS — Z6841 Body Mass Index (BMI) 40.0 and over, adult: Secondary | ICD-10-CM

## 2022-10-15 NOTE — Progress Notes (Signed)
[facility] [address] [place], [place] [address]  Internal MEDICINE  Office Visit Note  Patient Name: [name]  [id number]  [id number]  Date of Service: 10/15/2022  Chief Complaint  Patient presents with   Gastroesophageal Reflux   Hypertension   Follow-up    Lasix does adjustment     HPI [name] presents for a follow-up visit for lower extremity edema and hypertension.  bilateral lower extremity edema -- minimal improvement with lasix 60 mg daily, not hydrating enough 2.  weight loss -- discussed diet and lifestyle 3. Hypertension -- well controlled. On metoprolol, irbesartan and furosemide.      Current Medication: Outpatient Encounter Medications as of 10/15/2022  Medication Sig   albuterol (PROVENTIL HFA;VENTOLIN HFA) 108 (90 BASE) MCG/ACT inhaler Inhale 2 puffs into the lungs every 6 (six) hours as needed for wheezing or shortness of breath.   albuterol (PROVENTIL) (2.5 MG/3ML) 0.083% nebulizer solution USE 1 VIAL IN NEBULIZER EVERY 4 HOURS AS NEEDED FOR WHEEZING AND FOR SHORTNESS OF BREATH (MIX WITH IPRATROPIUM)   budesonide-formoterol (SYMBICORT) 160-4.5 MCG/ACT inhaler Inhale 2 puffs into the lungs every 12 (twelve) hours.   clonazePAM (KLONOPIN) 0.5 MG tablet Take 0.5 mg by mouth 2 (two) times daily as needed for anxiety. Take 1/2 tablet by mouth twice a day   furosemide (LASIX) 40 MG tablet Take 1.5 tablets (60 mg total) by mouth daily.   gabapentin (NEURONTIN) 300 MG capsule Take 300 mg by mouth daily. Take 1 to 2 capsules by mouth nightly for sleep   ipratropium (ATROVENT) 0.02 % nebulizer solution TAKE 2.5MLS BY NEBULIZATION FOUR TIMES DAILY WITH ALBUTEROL   irbesartan (AVAPRO) 150 MG tablet Take 1 tablet by mouth once daily   lamoTRIgine (LAMICTAL) 200 MG tablet Take 200 mg in the am, and 300 mg at night   metoprolol succinate (TOPROL-XL) 50 MG 24 hr tablet TAKE 1 & 1/2 (ONE & ONE-HALF) TABLETS BY MOUTH ONCE DAILY   nortriptyline (PAMELOR) 10  MG capsule Take 10 mg by mouth. Take 1 tablet po  in the morning   nortriptyline (PAMELOR) 50 MG capsule Take 50 mg by mouth at bedtime. Take 1 capsule by mouth at bedtime   predniSONE (DELTASONE) 10 MG tablet Take one tab 3 x day for 3 days, then take one tab 2 x a day for 3 days and then take one tab a day for 3 days   No facility-administered encounter medications on file as of 10/15/2022.    Surgical History: Past Surgical History:  Procedure Laterality Date   ABDOMINAL HYSTERECTOMY N/A 06/09/2015   Procedure: HYSTERECTOMY ABDOMINAL with Bilateral Salpingectomy;  Surgeon: Suzy Bouchard, MD;  Location: [facility];  Service: Gynecology;  Laterality: N/A;   CESAREAN SECTION  [id number]   COLONOSCOPY WITH PROPOFOL N/A 10/24/2020   Procedure: COLONOSCOPY WITH PROPOFOL;  Surgeon: Midge Minium, MD;  Location: [facility];  Service: Endoscopy;  Laterality: N/A;  sleep apnea   FINGER SURGERY  1992   GASTRIC BYPASS     INGUINAL HERNIA REPAIR Right 2010   REPLACEMENT TOTAL KNEE Right     Medical History: Past Medical History:  Diagnosis Date   Allergy    environmental   Anemia    Asthma    Bronchitis, acute    Complication of anesthesia    HAD ASTHMA ATTACK WHILE UNDER   Gastroesophageal reflux disease without esophagitis 11/02/2017   Hypertension    Seizure (HCC)    None this year (2022)  Sleep apnea    CPAP    Family History: Family History  Problem Relation Age of Onset   Diabetes Mother    Hypertension Mother    Cancer Mother    Congestive Heart Failure Father    Diabetes Father     Social History   Socioeconomic History   Marital status: Married    Spouse name: Not on file   Number of children: Not on file   Years of education: Not on file   Highest education level: Not on file  Occupational History   Not on file  Tobacco Use   Smoking status: Never   Smokeless tobacco: Never  Vaping Use   Vaping Use: Never used  Substance and Sexual Activity    Alcohol use: No   Drug use: No   Sexual activity: Yes  Other Topics Concern   Not on file  Social History Narrative   Not on file   Social Determinants of Health   Financial Resource Strain: Not on file  Food Insecurity: Not on file  Transportation Needs: Not on file  Physical Activity: Not on file  Stress: Not on file  Social Connections: Not on file  Intimate Partner Violence: Not on file      Review of Systems  Constitutional:  Negative for fatigue.  HENT: Negative.    Respiratory:  Negative for cough, chest tightness, shortness of breath and wheezing.   Cardiovascular:  Positive for leg swelling.  Musculoskeletal:  Positive for arthralgias and joint swelling.    Vital Signs: BP 105/65 Comment: 130/92  Pulse 90   Temp 97.8 F (36.6 C)   Resp 16   Ht 5\' 2"  (1.575 m)   Wt 265 lb (120.2 kg)   LMP 05/28/2015   SpO2 99%   BMI 48.47 kg/m    Physical Exam Vitals reviewed.  Constitutional:      General: She is not in acute distress.    Appearance: Normal appearance. She is obese. She is not ill-appearing.  HENT:     Head: Normocephalic and atraumatic.  Eyes:     Pupils: Pupils are equal, round, and reactive to light.  Cardiovascular:     Rate and Rhythm: Normal rate and regular rhythm.  Pulmonary:     Effort: Pulmonary effort is normal. No respiratory distress.  Musculoskeletal:     Right lower leg: Edema present.     Left lower leg: Edema present.  Neurological:     Mental Status: She is alert and oriented to person, place, and time.  Psychiatric:        Mood and Affect: Mood normal.        Behavior: Behavior normal.        Assessment/Plan: 1. Bilateral lower extremity edema Referred to cardiology for additional evaluation of blood pressure and lower extremity edema.  - Ambulatory referral to Cardiology  2. Essential hypertension Stable, continue medications as prescribed.   3. Abnormal weight gain Start diet and lifestyle modifications as  discussed   4. Class 3 severe obesity due to excess calories with serious comorbidity and body mass index (BMI) of 45.0 to 49.9 in adult [facility]) Discussed diet and lifestyle modifications, physical activity and drinking more water   General Counseling: Rolene verbalizes understanding of the findings of todays visit and agrees with plan of treatment. I have discussed any further diagnostic evaluation that may be needed or ordered today. We also reviewed her medications today. she has been encouraged to call the office with any  questions or concerns that should arise related to todays visit.    Orders Placed This Encounter  Procedures   Ambulatory referral to Cardiology    No orders of the defined types were placed in this encounter.   Return for previously scheduled, CPE, Tajh Livsey PCP in august.   Total time spent:30 Minutes Time spent includes review of chart, medications, test results, and follow up plan with the patient.   [place] Controlled Substance Database was reviewed by me.  This patient was seen by Sallyanne Kuster, FNP-C in collaboration with Dr. Beverely Risen as a part of collaborative care agreement.   Teagan Ozawa R. Tedd Sias, MSN, FNP-C Internal medicine

## 2022-10-19 ENCOUNTER — Telehealth: Payer: Self-pay | Admitting: Nurse Practitioner

## 2022-10-19 NOTE — Telephone Encounter (Signed)
Cardiology appointment 12/16/2022-Toni

## 2022-10-28 ENCOUNTER — Other Ambulatory Visit: Payer: Self-pay | Admitting: Nurse Practitioner

## 2022-10-28 DIAGNOSIS — I10 Essential (primary) hypertension: Secondary | ICD-10-CM

## 2022-10-28 DIAGNOSIS — R000 Tachycardia, unspecified: Secondary | ICD-10-CM

## 2022-11-17 ENCOUNTER — Ambulatory Visit (INDEPENDENT_AMBULATORY_CARE_PROVIDER_SITE_OTHER): Payer: 59 | Admitting: Nurse Practitioner

## 2022-11-17 ENCOUNTER — Encounter: Payer: Self-pay | Admitting: Nurse Practitioner

## 2022-11-17 VITALS — BP 136/86 | HR 96 | Temp 98.5°F | Resp 16 | Ht 62.0 in | Wt 263.6 lb

## 2022-11-17 DIAGNOSIS — K921 Melena: Secondary | ICD-10-CM

## 2022-11-17 DIAGNOSIS — K625 Hemorrhage of anus and rectum: Secondary | ICD-10-CM | POA: Diagnosis not present

## 2022-11-17 NOTE — Progress Notes (Signed)
[facility] [address] [place], [place] [address]  Internal MEDICINE  Office Visit Note  Patient Name: [name]  [id number]  [id number]  Date of Service: 11/17/2022  Chief Complaint  Patient presents with   Gastroesophageal Reflux   Hypertension   Follow-up    Needs GI referral    HPI Bree presents for a follow-up visit for rectal bleeding and blood in stool.  Stool has abnormal odor and then has had recent Episodes of rectal bleeding. Had some nausea Denies any vomiting, abdominal pain, distention, rectal pain, diarrhea, or constipation. Denies any known external or internal hemorrhoids.  The last day she saw any blood in her stool or when she wiped was yesterday there was a small amount. On 11/14/22, there was a larger amount of blood when she wiped.    Current Medication: Outpatient Encounter Medications as of 11/17/2022  Medication Sig   albuterol (PROVENTIL HFA;VENTOLIN HFA) 108 (90 BASE) MCG/ACT inhaler Inhale 2 puffs into the lungs every 6 (six) hours as needed for wheezing or shortness of breath.   albuterol (PROVENTIL) (2.5 MG/3ML) 0.083% nebulizer solution USE 1 VIAL IN NEBULIZER EVERY 4 HOURS AS NEEDED FOR WHEEZING AND FOR SHORTNESS OF BREATH (MIX WITH IPRATROPIUM)   budesonide-formoterol (SYMBICORT) 160-4.5 MCG/ACT inhaler Inhale 2 puffs into the lungs every 12 (twelve) hours.   clonazePAM (KLONOPIN) 0.5 MG tablet Take 0.5 mg by mouth 2 (two) times daily as needed for anxiety. Take 1/2 tablet by mouth twice a day   furosemide (LASIX) 40 MG tablet Take 1.5 tablets (60 mg total) by mouth daily.   gabapentin (NEURONTIN) 300 MG capsule Take 300 mg by mouth daily. Take 1 to 2 capsules by mouth nightly for sleep   ipratropium (ATROVENT) 0.02 % nebulizer solution TAKE 2.5MLS BY NEBULIZATION FOUR TIMES DAILY WITH ALBUTEROL   irbesartan (AVAPRO) 150 MG tablet Take 1 tablet by mouth once daily   lamoTRIgine (LAMICTAL) 200 MG tablet Take 200 mg in the am,  and 300 mg at night   metoprolol succinate (TOPROL-XL) 50 MG 24 hr tablet TAKE 1 & 1/2 (ONE & ONE-HALF) TABLETS BY MOUTH ONCE DAILY   nortriptyline (PAMELOR) 10 MG capsule Take 10 mg by mouth. Take 1 tablet po  in the morning   nortriptyline (PAMELOR) 50 MG capsule Take 50 mg by mouth at bedtime. Take 1 capsule by mouth at bedtime   predniSONE (DELTASONE) 10 MG tablet Take one tab 3 x day for 3 days, then take one tab 2 x a day for 3 days and then take one tab a day for 3 days   No facility-administered encounter medications on file as of 11/17/2022.    Surgical History: Past Surgical History:  Procedure Laterality Date   ABDOMINAL HYSTERECTOMY N/A 06/09/2015   Procedure: HYSTERECTOMY ABDOMINAL with Bilateral Salpingectomy;  Surgeon: Suzy Bouchard, MD;  Location: [facility];  Service: Gynecology;  Laterality: N/A;   CESAREAN SECTION  [id number]   COLONOSCOPY WITH PROPOFOL N/A 10/24/2020   Procedure: COLONOSCOPY WITH PROPOFOL;  Surgeon: Midge Minium, MD;  Location: [facility];  Service: Endoscopy;  Laterality: N/A;  sleep apnea   FINGER SURGERY  1992   GASTRIC BYPASS     INGUINAL HERNIA REPAIR Right 2010   REPLACEMENT TOTAL KNEE Right     Medical History: Past Medical History:  Diagnosis Date   Allergy    environmental   Anemia    Asthma    Bronchitis, acute    Complication of anesthesia  HAD ASTHMA ATTACK WHILE UNDER   Gastroesophageal reflux disease without esophagitis 11/02/2017   Hypertension    Seizure (HCC)    None this year (2022)   Sleep apnea    CPAP    Family History: Family History  Problem Relation Age of Onset   Diabetes Mother    Hypertension Mother    Cancer Mother    Congestive Heart Failure Father    Diabetes Father     Social History   Socioeconomic History   Marital status: Married    Spouse name: Not on file   Number of children: Not on file   Years of education: Not on file   Highest education level: Not on file  Occupational  History   Not on file  Tobacco Use   Smoking status: Never   Smokeless tobacco: Never  Vaping Use   Vaping Use: Never used  Substance and Sexual Activity   Alcohol use: No   Drug use: No   Sexual activity: Yes  Other Topics Concern   Not on file  Social History Narrative   Not on file   Social Determinants of Health   Financial Resource Strain: Not on file  Food Insecurity: Not on file  Transportation Needs: Not on file  Physical Activity: Not on file  Stress: Not on file  Social Connections: Not on file  Intimate Partner Violence: Not on file      Review of Systems  Constitutional: Negative.  Negative for appetite change, diaphoresis, fatigue and unexpected weight change.  Respiratory: Negative.  Negative for cough, chest tightness, shortness of breath and wheezing.   Cardiovascular: Negative.  Negative for chest pain and palpitations.  Gastrointestinal:  Positive for anal bleeding, blood in stool and nausea. Negative for abdominal distention, abdominal pain, constipation, diarrhea, rectal pain and vomiting.  Genitourinary: Negative.  Negative for decreased urine volume, dysuria, frequency, hematuria, pelvic pain and urgency.  Musculoskeletal: Negative.   Psychiatric/Behavioral: Negative.      Vital Signs: BP 136/86   Pulse 96   Temp 98.5 F (36.9 C)   Resp 16   Ht 5\' 2"  (1.575 m)   Wt 263 lb 9.6 oz (119.6 kg)   LMP 05/28/2015   SpO2 98%   BMI 48.21 kg/m    Physical Exam Vitals reviewed.  Constitutional:      General: She is not in acute distress.    Appearance: Normal appearance. She is obese. She is not ill-appearing.  HENT:     Head: Normocephalic and atraumatic.  Eyes:     Pupils: Pupils are equal, round, and reactive to light.  Cardiovascular:     Rate and Rhythm: Normal rate and regular rhythm.  Pulmonary:     Effort: Pulmonary effort is normal. No respiratory distress.  Neurological:     Mental Status: She is alert and oriented to person,  place, and time.  Psychiatric:        Mood and Affect: Mood normal.        Behavior: Behavior normal.        Assessment/Plan: 1. Rectal bleeding Referred to GI specialist - Ambulatory referral to Gastroenterology  2. Frank blood in stool Referred to GI specialist - Ambulatory referral to Gastroenterology   General Counseling: Erma verbalizes understanding of the findings of todays visit and agrees with plan of treatment. I have discussed any further diagnostic evaluation that may be needed or ordered today. We also reviewed her medications today. she has been encouraged to call the office  with any questions or concerns that should arise related to todays visit.    Orders Placed This Encounter  Procedures   Ambulatory referral to Gastroenterology    No orders of the defined types were placed in this encounter.   Return if symptoms worsen or fail to improve.   Total time spent:20 Minutes Time spent includes review of chart, medications, test results, and follow up plan with the patient.   [place] Controlled Substance Database was reviewed by me.  This patient was seen by Sallyanne Kuster, FNP-C in collaboration with Dr. Beverely Risen as a part of collaborative care agreement.   Malisha Mabey R. Tedd Sias, MSN, FNP-C Internal medicine

## 2022-11-26 ENCOUNTER — Other Ambulatory Visit: Payer: Self-pay | Admitting: Nurse Practitioner

## 2022-11-26 DIAGNOSIS — R000 Tachycardia, unspecified: Secondary | ICD-10-CM

## 2022-11-26 DIAGNOSIS — I10 Essential (primary) hypertension: Secondary | ICD-10-CM

## 2022-12-06 DIAGNOSIS — K449 Diaphragmatic hernia without obstruction or gangrene: Secondary | ICD-10-CM | POA: Insufficient documentation

## 2022-12-06 DIAGNOSIS — N921 Excessive and frequent menstruation with irregular cycle: Secondary | ICD-10-CM | POA: Insufficient documentation

## 2022-12-06 DIAGNOSIS — A048 Other specified bacterial intestinal infections: Secondary | ICD-10-CM | POA: Insufficient documentation

## 2022-12-08 ENCOUNTER — Ambulatory Visit (INDEPENDENT_AMBULATORY_CARE_PROVIDER_SITE_OTHER): Payer: 59 | Admitting: Physician Assistant

## 2022-12-08 ENCOUNTER — Encounter: Payer: Self-pay | Admitting: Physician Assistant

## 2022-12-08 ENCOUNTER — Telehealth: Payer: Self-pay

## 2022-12-08 VITALS — BP 111/80 | HR 106 | Temp 98.2°F | Ht 62.0 in | Wt 265.4 lb

## 2022-12-08 DIAGNOSIS — D649 Anemia, unspecified: Secondary | ICD-10-CM

## 2022-12-08 DIAGNOSIS — K625 Hemorrhage of anus and rectum: Secondary | ICD-10-CM | POA: Diagnosis not present

## 2022-12-08 DIAGNOSIS — K5901 Slow transit constipation: Secondary | ICD-10-CM

## 2022-12-08 DIAGNOSIS — Z9884 Bariatric surgery status: Secondary | ICD-10-CM

## 2022-12-08 DIAGNOSIS — K649 Unspecified hemorrhoids: Secondary | ICD-10-CM | POA: Diagnosis not present

## 2022-12-08 NOTE — Progress Notes (Signed)
Celso Amy, PA-C [address]  [address]  [place], [place] [address]  Main: [phone]  Fax: [phone]   Gastroenterology Consultation  Referring Provider:     Sallyanne Kuster, NP Primary Care Physician:  Sallyanne Kuster, NP Primary Gastroenterologist:  Celso Amy, PA-C / Dr. Midge Minium   Reason for Consultation:     Rectal bleeding        HPI:   [name] is a 52 y.o. y/o female referred for consultation & management  by Sallyanne Kuster, NP.    Here to evaluate rectal bleeding and anemia.  Patient has had intermittent episodes of bright red rectal bleeding and chronic constipation for several years.  Has history of internal hemorrhoids.  No current treatment.  She took Linzess in the past but not recently.  Lab 08/10/2022 showed mild normocytic anemia with hemoglobin 11.9, MCV 90.  Baseline hemoglobin typically between 12.9 and 13.6.  Has history of iron deficiency anemia for many years.  Took IV iron in the past.  Not currently on iron.  Last colonoscopy 09/2020 by Dr. Servando Snare showed nonbleeding internal hemorrhoids, otherwise normal with no polyps.  Excellent prep.  Repeat screening colonoscopy in 10 years.  History of gastric bypass.  Last EGD 09/2013 through [facility], report unavailable.  Past Medical History:  Diagnosis Date   Allergy    environmental   Anemia    Asthma    Bronchitis, acute    Complication of anesthesia    HAD ASTHMA ATTACK WHILE UNDER   Gastroesophageal reflux disease without esophagitis 11/02/2017   Hypertension    Seizure (HCC)    None this year (2022)   Sleep apnea    CPAP    Past Surgical History:  Procedure Laterality Date   ABDOMINAL HYSTERECTOMY N/A 06/09/2015   Procedure: HYSTERECTOMY ABDOMINAL with Bilateral Salpingectomy;  Surgeon: Suzy Bouchard, MD;  Location: [facility];  Service: Gynecology;  Laterality: N/A;   CESAREAN SECTION  [id number]   COLONOSCOPY WITH PROPOFOL N/A 10/24/2020   Procedure: COLONOSCOPY WITH  PROPOFOL;  Surgeon: Midge Minium, MD;  Location: [facility];  Service: Endoscopy;  Laterality: N/A;  sleep apnea   FINGER SURGERY  1992   GASTRIC BYPASS     INGUINAL HERNIA REPAIR Right 2010   REPLACEMENT TOTAL KNEE Right     Prior to Admission medications   Medication Sig Start Date End Date Taking? Authorizing Provider  albuterol (PROVENTIL HFA;VENTOLIN HFA) 108 (90 BASE) MCG/ACT inhaler Inhale 2 puffs into the lungs every 6 (six) hours as needed for wheezing or shortness of breath.   Yes [provider]  albuterol (PROVENTIL) (2.5 MG/3ML) 0.083% nebulizer solution USE 1 VIAL IN NEBULIZER EVERY 4 HOURS AS NEEDED FOR WHEEZING AND FOR SHORTNESS OF BREATH (MIX WITH IPRATROPIUM) 09/16/22  Yes Abernathy, Alyssa, NP  clonazePAM (KLONOPIN) 0.5 MG tablet Take 0.5 mg by mouth 2 (two) times daily as needed for anxiety. Take 1/2 tablet by mouth twice a day   Yes [provider]  furosemide (LASIX) 40 MG tablet Take 1.5 tablets (60 mg total) by mouth daily. 09/16/22  Yes Abernathy, Arlyss Repress, NP  gabapentin (NEURONTIN) 300 MG capsule Take 300 mg by mouth daily. Take 1 to 2 capsules by mouth nightly for sleep   Yes [provider]  ipratropium (ATROVENT) 0.02 % nebulizer solution TAKE 2.5MLS BY NEBULIZATION FOUR TIMES DAILY WITH ALBUTEROL 09/16/22  Yes Abernathy, Alyssa, NP  irbesartan (AVAPRO) 150 MG tablet Take 1 tablet by mouth once daily  11/26/22  Yes Abernathy, Arlyss Repress, NP  lamoTRIgine (LAMICTAL) 200 MG tablet Take 200 mg in the am, and 300 mg at night 06/25/22  Yes [provider]  metoprolol succinate (TOPROL-XL) 50 MG 24 hr tablet TAKE 1 & 1/2 (ONE & ONE-HALF) TABLETS BY MOUTH ONCE DAILY 11/26/22  Yes Abernathy, Alyssa, NP  nortriptyline (PAMELOR) 10 MG capsule Take 10 mg by mouth. Take 1 tablet po  in the morning   Yes Morene Crocker, MD  nortriptyline (PAMELOR) 50 MG capsule Take 50 mg by mouth at bedtime. Take 1 capsule by mouth at bedtime   Yes [provider]    Family History  Problem Relation Age of Onset   Diabetes Mother    Hypertension Mother    Cancer Mother    Congestive Heart Failure Father    Diabetes Father      Social History   Tobacco Use   Smoking status: Never   Smokeless tobacco: Never  Vaping Use   Vaping Use: Never used  Substance Use Topics   Alcohol use: No   Drug use: No    Allergies as of 12/08/2022 - Review Complete 12/08/2022  Allergen Reaction Noted   Keppra [levetiracetam] Other (See Comments) 09/05/2019   Duloxetine  11/05/2019    Review of Systems:    All systems reviewed and negative except where noted in HPI.   Physical Exam:  BP 111/80   Pulse (!) 106   Temp 98.2 F (36.8 C)   Ht 5\' 2"  (1.575 m)   Wt 265 lb 6.4 oz (120.4 kg)   LMP 05/28/2015   BMI 48.54 kg/m  Patient's last menstrual period was 05/28/2015. Psych:  Alert and cooperative. Normal mood and affect. General:   Alert,  Well-developed, well-nourished, pleasant and cooperative in NAD Head:  Normocephalic and atraumatic. Eyes:  Sclera clear, no icterus.   Conjunctiva pink. Neck:  Supple; no masses or thyromegaly. Lungs:  Respirations even and unlabored.  Clear throughout to auscultation.   No wheezes, crackles, or rhonchi. No acute distress. Heart:  Regular rate and rhythm; no murmurs, clicks, rubs, or gallops. Abdomen:  Normal bowel sounds.  No bruits.  Soft, and non-distended without masses, hepatosplenomegaly or hernias noted.  No Tenderness.  No guarding or rebound tenderness.    Neurologic:  Alert and oriented x3;  grossly normal neurologically. Psych:  Alert and cooperative. Normal mood and affect.  Imaging Studies: No results found.  Assessment and Plan:   [name] is a 52 y.o. y/o female has been referred for rectal bleeding, internal hemorrhoids, chronic constipation, and chronic iron deficiency anemia.  Colonoscopy 09/2020 showed nonbleeding internal hemorrhoids, otherwise normal.  Excellent  prep.  1.  Rectal bleeding  Negative colonoscopy 2 years ago in May 2022.  Checking lab work.  If she is anemic.  Then we will schedule repeat colonoscopy and EGD.  Treating underlying constipation and internal hemorrhoid bleeding.  2.  Internal hemorrhoids Rx Hydrocortisone Suppositories 25mg  Insert 1 into rectum once daily at bedtime for 10-14 days. Stressed importance of treating underlying constipation. Avoid Sitting on the toilet for prolonged amount of time. Discussed Internal Hemorrhoid Banding if no improvement with conservative treament.   3.  Constipation, chronic  Start MiraLAX 1 capful in a drink once daily  4.  Anemia  Labs: CBC, iron panel, ferritin, B12  If iron is low, then start iron.  May need referral for IV iron.  5.  History of gastric bypass: Contributing to chronic  anemia and iron deficiency.  Follow up in 4 weeks with TG.  Celso Amy, PA-C

## 2022-12-08 NOTE — Patient Instructions (Signed)
For Constipation: Recommend High Fiber diet with fruits, vegetables, and whole grains. Drink 64 ounces of Fluids Daily. Start OTC Miralax Powder, Mix 1 capful in a drink every day.   For internal hemorrhoid bleeding: I have prescribed hydrocortisone suppositories, insert 1 into the rectum once daily at bedtime for 2 weeks. I sent prescription to your pharmacy. Please let me know if the bleeding does not improve.

## 2022-12-08 NOTE — Telephone Encounter (Signed)
Message left reminding patient of future appointment with [facility].  Request patient to call office with any cardiac history.  If no history, no need to return call.  

## 2022-12-09 ENCOUNTER — Telehealth: Payer: Self-pay

## 2022-12-09 LAB — CBC
Hematocrit: 37.5 % (ref 34.0–46.6)
Hemoglobin: 12.7 g/dL (ref 11.1–15.9)
MCH: 30.2 pg (ref 26.6–33.0)
MCHC: 33.9 g/dL (ref 31.5–35.7)
MCV: 89 fL (ref 79–97)
Platelets: 283 10*3/uL (ref 150–450)
RBC: 4.2 x10E6/uL (ref 3.77–5.28)
RDW: 12.4 % (ref 11.7–15.4)
WBC: 5.2 10*3/uL (ref 3.4–10.8)

## 2022-12-09 LAB — IRON,TIBC AND FERRITIN PANEL
Ferritin: 46 ng/mL (ref 15–150)
Iron Saturation: 15 % (ref 15–55)
Iron: 57 ug/dL (ref 27–159)
Total Iron Binding Capacity: 386 ug/dL (ref 250–450)
UIBC: 329 ug/dL (ref 131–425)

## 2022-12-09 LAB — VITAMIN B12: Vitamin B-12: 479 pg/mL (ref 232–1245)

## 2022-12-09 NOTE — Telephone Encounter (Signed)
Left message for patient to return call to office.   Notify patient her hemoglobin is normal, 12.7.  Iron and vitamin B12 are also normal.  No evidence of anemia at this time.  Continue with current plan.

## 2022-12-09 NOTE — Telephone Encounter (Signed)
Patient called back and verbalized understanding of results  

## 2022-12-09 NOTE — Progress Notes (Signed)
Notify patient her hemoglobin is normal, 12.7.  Iron and vitamin B12 are also normal.  No evidence of anemia at this time.  Continue with current plan.

## 2022-12-16 ENCOUNTER — Ambulatory Visit: Payer: 59 | Admitting: Cardiology

## 2023-01-13 ENCOUNTER — Encounter: Payer: 59 | Admitting: Nurse Practitioner

## 2023-01-19 ENCOUNTER — Encounter: Payer: 59 | Admitting: Nurse Practitioner

## 2023-01-23 ENCOUNTER — Other Ambulatory Visit: Payer: Self-pay | Admitting: Nurse Practitioner

## 2023-01-23 DIAGNOSIS — I10 Essential (primary) hypertension: Secondary | ICD-10-CM

## 2023-02-08 ENCOUNTER — Ambulatory Visit (INDEPENDENT_AMBULATORY_CARE_PROVIDER_SITE_OTHER): Payer: 59 | Admitting: Internal Medicine

## 2023-02-08 ENCOUNTER — Encounter: Payer: Self-pay | Admitting: Internal Medicine

## 2023-02-08 ENCOUNTER — Telehealth: Payer: Self-pay

## 2023-02-08 ENCOUNTER — Telehealth: Payer: Self-pay | Admitting: Internal Medicine

## 2023-02-08 VITALS — BP 120/100 | HR 84 | Temp 97.8°F | Resp 16 | Ht 62.0 in | Wt 262.4 lb

## 2023-02-08 DIAGNOSIS — R319 Hematuria, unspecified: Secondary | ICD-10-CM | POA: Diagnosis not present

## 2023-02-08 DIAGNOSIS — N390 Urinary tract infection, site not specified: Secondary | ICD-10-CM

## 2023-02-08 DIAGNOSIS — N939 Abnormal uterine and vaginal bleeding, unspecified: Secondary | ICD-10-CM | POA: Diagnosis not present

## 2023-02-08 LAB — POCT URINALYSIS DIPSTICK
Bilirubin, UA: NEGATIVE
Glucose, UA: NEGATIVE
Ketones, UA: POSITIVE
Nitrite, UA: NEGATIVE
Protein, UA: NEGATIVE
Spec Grav, UA: 1.01 (ref 1.010–1.025)
Urobilinogen, UA: 0.2 U/dL
pH, UA: 5 (ref 5.0–8.0)

## 2023-02-08 NOTE — Progress Notes (Signed)
[facility] [address] [place], [place] [address]  Internal MEDICINE  Office Visit Note  Patient Name: [name]  [id number]  [id number]  Date of Service: 02/17/2023  Chief Complaint  Patient presents with   Acute Visit   Vaginal Bleeding    Patient has had unexplained vaginal bleeding, she has had a hysterectomy. Urine was tested and is positive for blood and leukocytes, patient denies UTI symptoms.    HPI Pt is seen for acute visit Pt noticed blood on her pad, she checked and though it is coming from vagina.no active bleeding at the moment  Hysterectomy was done a while back, will like to go back and see his Gynecologist   Denies any blood in her stools     Current Medication: Outpatient Encounter Medications as of 02/08/2023  Medication Sig   albuterol (PROVENTIL HFA;VENTOLIN HFA) 108 (90 BASE) MCG/ACT inhaler Inhale 2 puffs into the lungs every 6 (six) hours as needed for wheezing or shortness of breath.   albuterol (PROVENTIL) (2.5 MG/3ML) 0.083% nebulizer solution USE 1 VIAL IN NEBULIZER EVERY 4 HOURS AS NEEDED FOR WHEEZING AND FOR SHORTNESS OF BREATH (MIX WITH IPRATROPIUM)   clonazePAM (KLONOPIN) 0.5 MG tablet Take 0.5 mg by mouth 2 (two) times daily as needed for anxiety. Take 1/2 tablet by mouth twice a day   furosemide (LASIX) 40 MG tablet Take 1.5 tablets (60 mg total) by mouth daily.   gabapentin (NEURONTIN) 300 MG capsule Take 300 mg by mouth daily. Take 1 to 2 capsules by mouth nightly for sleep   ipratropium (ATROVENT) 0.02 % nebulizer solution TAKE 2.5MLS BY NEBULIZATION FOUR TIMES DAILY WITH ALBUTEROL   irbesartan (AVAPRO) 150 MG tablet Take 1 tablet by mouth once daily   lamoTRIgine (LAMICTAL) 200 MG tablet Take 200 mg in the am, and 300 mg at night   nortriptyline (PAMELOR) 10 MG capsule Take 10 mg by mouth. Take 1 tablet po  in the morning   nortriptyline (PAMELOR) 50 MG capsule Take 50 mg by mouth at bedtime. Take 1 capsule by mouth at  bedtime   [DISCONTINUED] metoprolol succinate (TOPROL-XL) 50 MG 24 hr tablet TAKE 1 & 1/2 (ONE & ONE-HALF) TABLETS BY MOUTH ONCE DAILY   No facility-administered encounter medications on file as of 02/08/2023.    Surgical History: Past Surgical History:  Procedure Laterality Date   ABDOMINAL HYSTERECTOMY N/A 06/09/2015   Procedure: HYSTERECTOMY ABDOMINAL with Bilateral Salpingectomy;  Surgeon: Suzy Bouchard, MD;  Location: [facility];  Service: Gynecology;  Laterality: N/A;   CESAREAN SECTION  [id number]   COLONOSCOPY WITH PROPOFOL N/A 10/24/2020   Procedure: COLONOSCOPY WITH PROPOFOL;  Surgeon: Midge Minium, MD;  Location: [facility];  Service: Endoscopy;  Laterality: N/A;  sleep apnea   FINGER SURGERY  1992   GASTRIC BYPASS     INGUINAL HERNIA REPAIR Right 2010   REPLACEMENT TOTAL KNEE Right     Medical History: Past Medical History:  Diagnosis Date   Allergy    environmental   Anemia    Asthma    Bronchitis, acute    Complication of anesthesia    HAD ASTHMA ATTACK WHILE UNDER   Gastroesophageal reflux disease without esophagitis 11/02/2017   Hypertension    Seizure (HCC)    None this year (2022)   Sleep apnea    CPAP    Family History: Family History  Problem Relation Age of Onset   Diabetes Mother    Hypertension Mother  Cancer Mother    Congestive Heart Failure Father    Diabetes Father     Social History   Socioeconomic History   Marital status: Married    Spouse name: Not on file   Number of children: Not on file   Years of education: Not on file   Highest education level: Not on file  Occupational History   Not on file  Tobacco Use   Smoking status: Never   Smokeless tobacco: Never  Vaping Use   Vaping status: Never Used  Substance and Sexual Activity   Alcohol use: No   Drug use: No   Sexual activity: Yes  Other Topics Concern   Not on file  Social History Narrative   Not on file   Social Determinants of Health   Financial  Resource Strain: Not on file  Food Insecurity: Not on file  Transportation Needs: Not on file  Physical Activity: Not on file  Stress: Not on file  Social Connections: Not on file  Intimate Partner Violence: Not on file      Review of Systems  Constitutional:  Negative for fatigue and fever.  HENT:  Negative for congestion, mouth sores and postnasal drip.   Respiratory:  Negative for cough.   Cardiovascular:  Negative for chest pain.  Genitourinary:  Positive for vaginal bleeding. Negative for flank pain.  Psychiatric/Behavioral: Negative.      Vital Signs: BP (!) 120/100   Pulse 84   Temp 97.8 F (36.6 C)   Resp 16   Ht 5\' 2"  (1.575 m)   Wt 262 lb 6.4 oz (119 kg)   LMP 05/28/2015   SpO2 95%   BMI 47.99 kg/m    Physical Exam Constitutional:      Appearance: Normal appearance.  HENT:     Head: Normocephalic and atraumatic.     Nose: Nose normal.     Mouth/Throat:     Mouth: Mucous membranes are moist.     Pharynx: No posterior oropharyngeal erythema.  Eyes:     Extraocular Movements: Extraocular movements intact.     Pupils: Pupils are equal, round, and reactive to light.  Cardiovascular:     Pulses: Normal pulses.     Heart sounds: Normal heart sounds.  Pulmonary:     Effort: Pulmonary effort is normal.     Breath sounds: Normal breath sounds.  Genitourinary:    General: Normal vulva.     Vagina: Vaginal discharge present.     Rectum: Normal.     Comments: Brownish ( mild on PV exam)  Neurological:     General: No focal deficit present.     Mental Status: She is alert.  Psychiatric:        Mood and Affect: Mood normal.        Behavior: Behavior normal.        Assessment/Plan: 1. Vaginal bleeding Reassurance is provided, will refer to gyn, no ecxessive or active bleeding, ?? Atrophic vaginitis  - POCT Urinalysis Dipstick - FSH/LH - Ambulatory referral to Gynecology - CBC with Differential/Platelet - Iron, TIBC and Ferritin Panel  2. Urinary  tract infection with hematuria, site unspecified Will await for c/s  - CULTURE, URINE COMPREHENSIVE   General Counseling: [name] verbalizes understanding of the findings of todays visit and agrees with plan of treatment. I have discussed any further diagnostic evaluation that may be needed or ordered today. We also reviewed her medications today. she has been encouraged to call the office with any questions  or concerns that should arise related to todays visit.    Orders Placed This Encounter  Procedures   CULTURE, URINE COMPREHENSIVE   FSH/LH   CBC with Differential/Platelet   Iron, TIBC and Ferritin Panel   Ambulatory referral to Gynecology   POCT Urinalysis Dipstick    No orders of the defined types were placed in this encounter.   Total time spent:25 Minutes Time spent includes review of chart, medications, test results, and follow up plan with the patient.   [place] Controlled Substance Database was reviewed by me.   Dr Lyndon Code Internal medicine

## 2023-02-08 NOTE — Telephone Encounter (Addendum)
Pt called  that she having  vaginal spotting and pt had hysterectomy and made her appt today with DFK

## 2023-02-08 NOTE — Telephone Encounter (Signed)
Awaiting 02/08/23 office notes for GYN referral-Toni

## 2023-02-09 ENCOUNTER — Ambulatory Visit: Payer: 59 | Attending: Cardiology | Admitting: Cardiology

## 2023-02-09 ENCOUNTER — Encounter: Payer: Self-pay | Admitting: Cardiology

## 2023-02-09 ENCOUNTER — Ambulatory Visit: Payer: 59 | Admitting: Nurse Practitioner

## 2023-02-09 VITALS — BP 112/86 | HR 77 | Ht 62.0 in | Wt 262.2 lb

## 2023-02-09 DIAGNOSIS — R600 Localized edema: Secondary | ICD-10-CM | POA: Diagnosis not present

## 2023-02-09 DIAGNOSIS — I5189 Other ill-defined heart diseases: Secondary | ICD-10-CM | POA: Diagnosis not present

## 2023-02-09 DIAGNOSIS — I10 Essential (primary) hypertension: Secondary | ICD-10-CM | POA: Diagnosis not present

## 2023-02-09 MED ORDER — SEMAGLUTIDE-WEIGHT MANAGEMENT 1.7 MG/0.75ML ~~LOC~~ SOAJ: 1.7000 mg | SUBCUTANEOUS | 0 refills | Status: DC

## 2023-02-09 MED ORDER — SEMAGLUTIDE-WEIGHT MANAGEMENT 0.5 MG/0.5ML ~~LOC~~ SOAJ: 0.5000 mg | SUBCUTANEOUS | 0 refills | Status: DC

## 2023-02-09 MED ORDER — SEMAGLUTIDE-WEIGHT MANAGEMENT 2.4 MG/0.75ML ~~LOC~~ SOAJ: 2.4000 mg | SUBCUTANEOUS | 3 refills | Status: DC

## 2023-02-09 MED ORDER — SEMAGLUTIDE-WEIGHT MANAGEMENT 1 MG/0.5ML ~~LOC~~ SOAJ: 1.0000 mg | SUBCUTANEOUS | 0 refills | Status: DC

## 2023-02-09 MED ORDER — SEMAGLUTIDE-WEIGHT MANAGEMENT 0.25 MG/0.5ML ~~LOC~~ SOAJ: 0.2500 mg | SUBCUTANEOUS | 0 refills | Status: AC

## 2023-02-09 NOTE — Progress Notes (Signed)
Cardiology Office Note:    Date:  02/09/2023   ID:  [name], DOB Apr 19, 1971, MRN [id number]  PCP:  Sallyanne Kuster, NP   [facility] Providers Cardiologist:  Debbe Odea, MD     Referring MD: Sallyanne Kuster, NP   Chief Complaint  Patient presents with   New Patient (Initial Visit)    Referral for cardiac evaluation of Bilateral lower extremity edema with no cardiac history.  Patient reports LE edema for "a while" that is not improved with diuretics.     [name] is a 52 y.o. female who is being seen today for the evaluation of leg edema at the request of Sallyanne Kuster, NP.   History of Present Illness:    [name] is a 52 y.o. female with a hx of hypertension, asthma presenting with leg edema.  States having leg edema ongoing over the past 6 months.  Started on Lasix 40 mg daily, followed up with PCP 1 month ago, Lasix was increased to 60 mg.  States edema is improved.  Denies shortness of breath, endorses not eating healthy.  States eating lots of sweet foods, also snacks late.  Has gained about 60 pounds over the past year.  Denies smoking.  BP well-controlled on current medications.  Echocardiogram obtained 04/2022 showed EF 60 to 65%, impaired relaxation.  Past Medical History:  Diagnosis Date   Allergy    environmental   Anemia    Asthma    Bronchitis, acute    Complication of anesthesia    HAD ASTHMA ATTACK WHILE UNDER   Gastroesophageal reflux disease without esophagitis 11/02/2017   Hypertension    Seizure (HCC)    None this year (2022)   Sleep apnea    CPAP    Past Surgical History:  Procedure Laterality Date   ABDOMINAL HYSTERECTOMY N/A 06/09/2015   Procedure: HYSTERECTOMY ABDOMINAL with Bilateral Salpingectomy;  Surgeon: Suzy Bouchard, MD;  Location: [facility];  Service: Gynecology;  Laterality: N/A;   CESAREAN SECTION  [id number]   COLONOSCOPY WITH PROPOFOL N/A 10/24/2020   Procedure: COLONOSCOPY WITH  PROPOFOL;  Surgeon: Midge Minium, MD;  Location: [facility];  Service: Endoscopy;  Laterality: N/A;  sleep apnea   FINGER SURGERY  1992   GASTRIC BYPASS     INGUINAL HERNIA REPAIR Right 2010   REPLACEMENT TOTAL KNEE Right     Current Medications: Current Meds  Medication Sig   albuterol (PROVENTIL HFA;VENTOLIN HFA) 108 (90 BASE) MCG/ACT inhaler Inhale 2 puffs into the lungs every 6 (six) hours as needed for wheezing or shortness of breath.   albuterol (PROVENTIL) (2.5 MG/3ML) 0.083% nebulizer solution USE 1 VIAL IN NEBULIZER EVERY 4 HOURS AS NEEDED FOR WHEEZING AND FOR SHORTNESS OF BREATH (MIX WITH IPRATROPIUM)   clonazePAM (KLONOPIN) 0.5 MG tablet Take 0.5 mg by mouth 2 (two) times daily as needed for anxiety. Take 1/2 tablet by mouth twice a day   furosemide (LASIX) 40 MG tablet Take 1.5 tablets (60 mg total) by mouth daily.   gabapentin (NEURONTIN) 300 MG capsule Take 300 mg by mouth daily. Take 1 to 2 capsules by mouth nightly for sleep   ipratropium (ATROVENT) 0.02 % nebulizer solution TAKE 2.5MLS BY NEBULIZATION FOUR TIMES DAILY WITH ALBUTEROL   irbesartan (AVAPRO) 150 MG tablet Take 1 tablet by mouth once daily   lamoTRIgine (LAMICTAL) 200 MG tablet Take 200 mg in the am, and 300 mg at night   metoprolol succinate (TOPROL-XL) 50 MG 24  hr tablet TAKE 1 & 1/2 (ONE & ONE-HALF) TABLETS BY MOUTH ONCE DAILY   nortriptyline (PAMELOR) 10 MG capsule Take 10 mg by mouth. Take 1 tablet po  in the morning   nortriptyline (PAMELOR) 50 MG capsule Take 50 mg by mouth at bedtime. Take 1 capsule by mouth at bedtime   Semaglutide-Weight Management 0.25 MG/0.5ML SOAJ Inject 0.25 mg into the skin once a week for 28 days.   [START ON 03/10/2023] Semaglutide-Weight Management 0.5 MG/0.5ML SOAJ Inject 0.5 mg into the skin once a week for 28 days.   [START ON 04/08/2023] Semaglutide-Weight Management 1 MG/0.5ML SOAJ Inject 1 mg into the skin once a week for 28 days.   [START ON 05/07/2023]  Semaglutide-Weight Management 1.7 MG/0.75ML SOAJ Inject 1.7 mg into the skin once a week for 28 days.   [START ON 06/05/2023] Semaglutide-Weight Management 2.4 MG/0.75ML SOAJ Inject 2.4 mg into the skin once a week.     Allergies:   Keppra [levetiracetam] and Duloxetine   Social History   Socioeconomic History   Marital status: Married    Spouse name: Not on file   Number of children: Not on file   Years of education: Not on file   Highest education level: Not on file  Occupational History   Not on file  Tobacco Use   Smoking status: Never   Smokeless tobacco: Never  Vaping Use   Vaping status: Never Used  Substance and Sexual Activity   Alcohol use: No   Drug use: No   Sexual activity: Yes  Other Topics Concern   Not on file  Social History Narrative   Not on file   Social Determinants of Health   Financial Resource Strain: Not on file  Food Insecurity: Not on file  Transportation Needs: Not on file  Physical Activity: Not on file  Stress: Not on file  Social Connections: Not on file     Family History: The patient's family history includes Cancer in her mother; Congestive Heart Failure in her father; Diabetes in her father and mother; Hypertension in her mother.  ROS:   Please see the history of present illness.     All other systems reviewed and are negative.  EKGs/Labs/Other Studies Reviewed:    The following studies were reviewed today:  EKG Interpretation Date/Time:  Wednesday February 09 2023 10:42:07 EDT Ventricular Rate:  77 PR Interval:  160 QRS Duration:  92 QT Interval:  394 QTC Calculation: 445 R Axis:   7  Text Interpretation: Normal sinus rhythm Normal ECG Confirmed by Debbe Odea ([id number]) on 02/09/2023 11:11:49 AM    Recent Labs: 08/10/2022: ALT 16; BUN 8; Creatinine, Ser 0.84; Potassium 3.1; Sodium 140 12/08/2022: Hemoglobin 12.7; Platelets 283  Recent Lipid Panel    Component Value Date/Time   CHOL 193 10/02/2020 1639   CHOL 158  06/28/2012 0746   TRIG 177 (H) 10/02/2020 1639   TRIG 125 06/28/2012 0746   HDL 43 10/02/2020 1639   HDL 33 (L) 06/28/2012 0746   CHOLHDL 4.5 10/02/2020 1639   VLDL 35 10/02/2020 1639   VLDL 25 06/28/2012 0746   LDLCALC 115 (H) 10/02/2020 1639   LDLCALC 100 06/28/2012 0746     Risk Assessment/Calculations:             Physical Exam:    VS:  BP 112/86 (BP Location: Left Arm, Patient Position: Sitting, Cuff Size: Large)   Pulse 77   Ht 5\' 2"  (1.575 m)   Wt 262 lb  3.2 oz (118.9 kg)   LMP 05/28/2015   SpO2 97%   BMI 47.96 kg/m     Wt Readings from Last 3 Encounters:  02/09/23 262 lb 3.2 oz (118.9 kg)  02/08/23 262 lb 6.4 oz (119 kg)  12/08/22 265 lb 6.4 oz (120.4 kg)     GEN:  Well nourished, well developed in no acute distress HEENT: Normal NECK: No JVD; No carotid bruits CARDIAC: RRR, no murmurs, rubs, gallops RESPIRATORY:  Clear to auscultation without rales, wheezing or rhonchi  ABDOMEN: Soft, non-tender, non-distended MUSCULOSKELETAL:  No edema; No deformity  SKIN: Warm and dry NEUROLOGIC:  Alert and oriented x 3 PSYCHIATRIC:  Normal affect   ASSESSMENT:    1. Bilateral lower extremity edema   2. Diastolic dysfunction   3. Morbid obesity (HCC)   4. Primary hypertension    PLAN:    In order of problems listed above:  Bilateral leg edema, improved with Lasix, continue Lasix 60 mg daily, low-salt diet advised. HFpEF, EF 60 to 65%, impaired relaxation, etiology likely from morbid obesity.  Appears euvolemic, continue Lasix. Morbid obesity, low-calorie diet, weight loss advised.  Start [employer]. Hypertension, BP controlled.  Continue irbesartan 150 mg daily, Toprol-XL 25 mg daily.  Follow-up in 3 months.     Medication Adjustments/Labs and Tests Ordered: Current medicines are reviewed at length with the patient today.  Concerns regarding medicines are outlined above.  Orders Placed This Encounter  Procedures   EKG 12-Lead   Meds ordered this encounter   Medications   Semaglutide-Weight Management 0.25 MG/0.5ML SOAJ    Sig: Inject 0.25 mg into the skin once a week for 28 days.    Dispense:  2 mL    Refill:  0   Semaglutide-Weight Management 0.5 MG/0.5ML SOAJ    Sig: Inject 0.5 mg into the skin once a week for 28 days.    Dispense:  2 mL    Refill:  0   Semaglutide-Weight Management 1 MG/0.5ML SOAJ    Sig: Inject 1 mg into the skin once a week for 28 days.    Dispense:  2 mL    Refill:  0   Semaglutide-Weight Management 1.7 MG/0.75ML SOAJ    Sig: Inject 1.7 mg into the skin once a week for 28 days.    Dispense:  3 mL    Refill:  0   Semaglutide-Weight Management 2.4 MG/0.75ML SOAJ    Sig: Inject 2.4 mg into the skin once a week.    Dispense:  3 mL    Refill:  3    Patient Instructions  Medication Instructions:    Start taking Wegovy  Month 1: 0.25 mg once a week.  Month 2: 0.5 mg once a week. (Call or send [place] a MyChart message when you are on week 2, so we can send your next dose in for you).  Month 3: 1 mg once a week.  Month 4: 1.7 mg once a week.  Month 5 and beyond: 2.4 mg once a week (maintenance dose)  *If you need a refill on your cardiac medications before your next appointment, please call your pharmacy*   Lab Work:  None Ordered  If you have labs (blood work) drawn today and your tests are completely normal, you will receive your results only by: MyChart Message (if you have MyChart) OR A paper copy in the mail If you have any lab test that is abnormal or we need to change your treatment, we will call you  to review the results.   Testing/Procedures:  None Ordered   Follow-Up: At [facility], you and your health needs are our priority.  As part of our continuing mission to provide you with exceptional heart care, we have created designated Provider Care Teams.  These Care Teams include your primary Cardiologist (physician) and Advanced Practice Providers (APPs -  Physician Assistants and  Nurse Practitioners) who all work together to provide you with the care you need, when you need it.  We recommend signing up for the patient portal called "MyChart".  Sign up information is provided on this After Visit Summary.  MyChart is used to connect with patients for Virtual Visits (Telemedicine).  Patients are able to view lab/test results, encounter notes, upcoming appointments, etc.  Non-urgent messages can be sent to your provider as well.   To learn more about what you can do with MyChart, go to [email or web address].    Your next appointment:   3 month(s)  Provider:   Debbe Odea, MD ONLY      Signed, Debbe Odea, MD  02/09/2023 12:05 PM    [facility]

## 2023-02-09 NOTE — Patient Instructions (Signed)
Medication Instructions:    Start taking Wegovy  Month 1: 0.25 mg once a week.  Month 2: 0.5 mg once a week. (Call or send [place] a MyChart message when you are on week 2, so we can send your next dose in for you).  Month 3: 1 mg once a week.  Month 4: 1.7 mg once a week.  Month 5 and beyond: 2.4 mg once a week (maintenance dose)  *If you need a refill on your cardiac medications before your next appointment, please call your pharmacy*   Lab Work:  None Ordered  If you have labs (blood work) drawn today and your tests are completely normal, you will receive your results only by: MyChart Message (if you have MyChart) OR A paper copy in the mail If you have any lab test that is abnormal or we need to change your treatment, we will call you to review the results.   Testing/Procedures:  None Ordered   Follow-Up: At [facility], you and your health needs are our priority.  As part of our continuing mission to provide you with exceptional heart care, we have created designated Provider Care Teams.  These Care Teams include your primary Cardiologist (physician) and Advanced Practice Providers (APPs -  Physician Assistants and Nurse Practitioners) who all work together to provide you with the care you need, when you need it.  We recommend signing up for the patient portal called "MyChart".  Sign up information is provided on this After Visit Summary.  MyChart is used to connect with patients for Virtual Visits (Telemedicine).  Patients are able to view lab/test results, encounter notes, upcoming appointments, etc.  Non-urgent messages can be sent to your provider as well.   To learn more about what you can do with MyChart, go to [email or web address].    Your next appointment:   3 month(s)  Provider:   Debbe Odea, MD ONLY

## 2023-02-12 LAB — CULTURE, URINE COMPREHENSIVE

## 2023-02-15 ENCOUNTER — Other Ambulatory Visit: Payer: Self-pay | Admitting: Nurse Practitioner

## 2023-02-15 DIAGNOSIS — R000 Tachycardia, unspecified: Secondary | ICD-10-CM

## 2023-02-18 ENCOUNTER — Telehealth: Payer: Self-pay | Admitting: Nurse Practitioner

## 2023-02-18 NOTE — Telephone Encounter (Signed)
Gyn referral sent via [employer] to [facility]. Notified patient. Gave pt telephone # [phone]

## 2023-02-19 ENCOUNTER — Other Ambulatory Visit
Admission: RE | Admit: 2023-02-19 | Discharge: 2023-02-19 | Disposition: A | Discharge status: Home / Self Care | Payer: 59 | Attending: Internal Medicine | Admitting: Internal Medicine

## 2023-02-19 DIAGNOSIS — N939 Abnormal uterine and vaginal bleeding, unspecified: Secondary | ICD-10-CM | POA: Diagnosis present

## 2023-02-19 LAB — CBC WITH DIFFERENTIAL/PLATELET
Abs Immature Granulocytes: 0.01 10*3/uL (ref 0.00–0.07)
Basophils Absolute: 0 10*3/uL (ref 0.0–0.1)
Basophils Relative: 1 %
Eosinophils Absolute: 0.2 10*3/uL (ref 0.0–0.5)
Eosinophils Relative: 3 %
HCT: 37.3 % (ref 36.0–46.0)
Hemoglobin: 12.8 g/dL (ref 12.0–15.0)
Immature Granulocytes: 0 %
Lymphocytes Relative: 45 %
Lymphs Abs: 2.4 10*3/uL (ref 0.7–4.0)
MCH: 30.3 pg (ref 26.0–34.0)
MCHC: 34.3 g/dL (ref 30.0–36.0)
MCV: 88.2 fL (ref 80.0–100.0)
Monocytes Absolute: 0.5 10*3/uL (ref 0.1–1.0)
Monocytes Relative: 10 %
Neutro Abs: 2.2 10*3/uL (ref 1.7–7.7)
Neutrophils Relative %: 41 %
Platelets: 292 10*3/uL (ref 150–400)
RBC: 4.23 MIL/uL (ref 3.87–5.11)
RDW: 13.2 % (ref 11.5–15.5)
WBC: 5.3 10*3/uL (ref 4.0–10.5)
nRBC: 0 % (ref 0.0–0.2)

## 2023-02-19 LAB — IRON AND TIBC
Iron: 57 ug/dL (ref 28–170)
Saturation Ratios: 13 % (ref 10.4–31.8)
TIBC: 428 ug/dL (ref 250–450)
UIBC: 371 ug/dL

## 2023-02-20 ENCOUNTER — Other Ambulatory Visit: Payer: Self-pay | Admitting: Nurse Practitioner

## 2023-02-20 DIAGNOSIS — I10 Essential (primary) hypertension: Secondary | ICD-10-CM

## 2023-02-20 LAB — URINE CULTURE: Culture: <10000 — AB

## 2023-02-22 LAB — FSH/LH
FSH: 37.7 m[IU]/mL
LH: 24.3 m[IU]/mL

## 2023-03-13 ENCOUNTER — Other Ambulatory Visit: Payer: Self-pay | Admitting: Nurse Practitioner

## 2023-03-13 DIAGNOSIS — R600 Localized edema: Secondary | ICD-10-CM

## 2023-03-13 DIAGNOSIS — I10 Essential (primary) hypertension: Secondary | ICD-10-CM

## 2023-03-17 ENCOUNTER — Other Ambulatory Visit: Payer: Self-pay

## 2023-03-17 ENCOUNTER — Emergency Department
Admission: EM | Admit: 2023-03-17 | Discharge: 2023-03-17 | Disposition: A | Discharge status: Home / Self Care | Payer: 59 | Attending: Emergency Medicine | Admitting: Emergency Medicine

## 2023-03-17 ENCOUNTER — Emergency Department: Payer: 59

## 2023-03-17 DIAGNOSIS — R42 Dizziness and giddiness: Secondary | ICD-10-CM | POA: Diagnosis present

## 2023-03-17 DIAGNOSIS — R519 Headache, unspecified: Secondary | ICD-10-CM | POA: Diagnosis not present

## 2023-03-17 LAB — BASIC METABOLIC PANEL
Anion gap: 7 (ref 5–15)
BUN: 9 mg/dL (ref 6–20)
CO2: 29 mmol/L (ref 22–32)
Calcium: 8.4 mg/dL — ABNORMAL LOW (ref 8.9–10.3)
Chloride: 100 mmol/L (ref 98–111)
Creatinine, Ser: 0.93 mg/dL (ref 0.44–1.00)
GFR, Estimated: >60 mL/min (ref >60)
Glucose, Bld: 106 mg/dL — ABNORMAL HIGH (ref 70–99)
Potassium: 3.5 mmol/L (ref 3.5–5.1)
Sodium: 136 mmol/L (ref 135–145)

## 2023-03-17 LAB — CBC
HCT: 37.6 % (ref 36.0–46.0)
Hemoglobin: 12.3 g/dL (ref 12.0–15.0)
MCH: 30.1 pg (ref 26.0–34.0)
MCHC: 32.7 g/dL (ref 30.0–36.0)
MCV: 91.9 fL (ref 80.0–100.0)
Platelets: 294 10*3/uL (ref 150–400)
RBC: 4.09 MIL/uL (ref 3.87–5.11)
RDW: 13.2 % (ref 11.5–15.5)
WBC: 5.2 10*3/uL (ref 4.0–10.5)
nRBC: 0 % (ref 0.0–0.2)

## 2023-03-17 LAB — TROPONIN I (HIGH SENSITIVITY): Troponin I (High Sensitivity): <2 ng/L (ref <18)

## 2023-03-17 MED ORDER — BUTALBITAL-APAP-CAFFEINE 50-325-40 MG PO TABS
1.0000 | ORAL_TABLET | Freq: Once | ORAL | Status: AC
  Administered 2023-03-17: 1 via ORAL
  Filled 2023-03-17: qty 1

## 2023-03-17 MED ORDER — MECLIZINE HCL 25 MG PO TABS: 25.0000 mg | ORAL_TABLET | Freq: Three times a day (TID) | ORAL | 0 refills | Status: DC | PRN

## 2023-03-17 MED ORDER — MECLIZINE HCL 25 MG PO TABS
25.0000 mg | ORAL_TABLET | Freq: Once | ORAL | Status: AC
  Administered 2023-03-17: 25 mg via ORAL
  Filled 2023-03-17: qty 1

## 2023-03-17 MED ORDER — BUTALBITAL-APAP-CAFFEINE 50-325-40 MG PO TABS
1.0000 | ORAL_TABLET | Freq: Four times a day (QID) | ORAL | 0 refills | Status: DC | PRN
Start: 2023-03-17 — End: 2023-06-20

## 2023-03-17 NOTE — ED Triage Notes (Signed)
Pt sts that she has a hx of vertigo. Pt sts that it feels the same. Pt sts that she started to get dizzy last night.

## 2023-03-17 NOTE — Discharge Instructions (Addendum)
Please seek medical attention for any high fevers, chest pain, shortness of breath, change in behavior, persistent vomiting, bloody stool or any other new or concerning symptoms. Please talk to Dr. Malvin Johns (your neurologist) about the MRI findings today of an "empty sella" (a non specific finding that would not have caused the dizziness but can be further looked into).

## 2023-03-17 NOTE — ED Notes (Signed)
First nurse note: Pt here via [facility] from work with dizziness/headache.  HX of vertigo/Seizures  115/82 98%  HR: 84 CBG 102

## 2023-03-17 NOTE — ED Provider Notes (Signed)
[facility] Provider Note    Event Date/Time   First MD Initiated Contact with Patient 03/17/23 1535     (approximate)   History   Dizziness   HPI  [name] is a 52 y.o. female who presents to the emergency department today because of complaints for dizziness and room spinning.  Patient for started having the symptoms yesterday but they have continued today.  She feels the spinning sensation even when she is just lying in the bed.  This has been accompanied by bad headache located throughout her head.  Patient states that she was diagnosed with vertigo once a couple of years ago and this does somewhat remind her of those symptoms.     Physical Exam   Triage Vital Signs: ED Triage Vitals  Encounter Vitals Group     BP 03/17/23 1332 121/78     Systolic BP Percentile --      Diastolic BP Percentile --      Pulse Rate 03/17/23 1332 85     Resp 03/17/23 1332 17     Temp 03/17/23 1332 97.9 F (36.6 C)     Temp Source 03/17/23 1332 Oral     SpO2 03/17/23 1332 100 %     Weight 03/17/23 1333 262 lb (118.8 kg)     Height 03/17/23 1333 5\' 2"  (1.575 m)     Head Circumference --      Peak Flow --      Pain Score 03/17/23 1332 7     Pain Loc --      Pain Education --      Exclude from Growth Chart --     Most recent vital signs: Vitals:   03/17/23 1332  BP: 121/78  Pulse: 85  Resp: 17  Temp: 97.9 F (36.6 C)  SpO2: 100%   General: Awake, alert, oriented. CV:  Good peripheral perfusion. Regular rate and rhythm. Resp:  Normal effort. Lungs clear. Abd:  No distention.    ED Results / Procedures / Treatments   Labs (all labs ordered are listed, but only abnormal results are displayed) Labs Reviewed  BASIC METABOLIC PANEL - Abnormal; Notable for the following components:      Result Value   Glucose, Bld 106 (*)    Calcium 8.4 (*)    All other components within normal limits  CBC  TROPONIN I (HIGH SENSITIVITY)     EKG  I,  Phineas Semen, attending physician, personally viewed and interpreted this EKG  EKG Time: 1333 Rate: 86 Rhythm: normal sinus rhythm Axis: normal Intervals: qtc 459 QRS: narrow, q waves III ST changes: no st elevation Impression: abnormal ekg    RADIOLOGY I independently interpreted and visualized the MR brain. My interpretation: no stroke Radiology interpretation:  IMPRESSION:  1. No acute intracranial process. No evidence of acute or subacute  infarct.  2. Partial empty sella and tortuous optic nerves, which are  nonspecific but can be seen in the setting of idiopathic  intracranial hypertension.      PROCEDURES:  Critical Care performed: No   MEDICATIONS ORDERED IN ED: Medications - No data to display   IMPRESSION / MDM / ASSESSMENT AND PLAN / ED COURSE  I reviewed the triage vital signs and the nursing notes.                              Differential diagnosis includes, but is not limited  to, vertigo, CVA, dehydration, anemia  Patient's presentation is most consistent with acute presentation with potential threat to life or bodily function.  Patient presented to the emergency department today because of concerns for dizziness.  Patient states she does have history of vertigo.  EKG without concerning abnormality.  Given patient's symptoms would have concern for possible CVA.  Will obtain MRI to evaluate.  MRI without signs concerning for acute stroke. The was findings of partial empty sella. At this time patient can follow up with neurologist for further characterization as appropriate. Patient did feel better after medication. Do think dizziness likely due to vertigo. Will plan on discharging with meclizine and ENT follow up.      FINAL CLINICAL IMPRESSION(S) / ED DIAGNOSES   Final diagnoses:  Vertigo     Note:  This document was prepared using Dragon voice recognition software and may include unintentional dictation errors.    Phineas Semen,  MD 03/17/23 2032

## 2023-03-18 ENCOUNTER — Ambulatory Visit (INDEPENDENT_AMBULATORY_CARE_PROVIDER_SITE_OTHER): Payer: 59 | Admitting: Nurse Practitioner

## 2023-03-18 ENCOUNTER — Encounter: Payer: Self-pay | Admitting: Nurse Practitioner

## 2023-03-18 ENCOUNTER — Other Ambulatory Visit: Payer: Self-pay | Admitting: Nurse Practitioner

## 2023-03-18 VITALS — BP 111/67 | HR 86 | Temp 98.5°F | Resp 16 | Ht 62.0 in | Wt 262.0 lb

## 2023-03-18 DIAGNOSIS — Z1231 Encounter for screening mammogram for malignant neoplasm of breast: Secondary | ICD-10-CM

## 2023-03-18 DIAGNOSIS — E782 Mixed hyperlipidemia: Secondary | ICD-10-CM

## 2023-03-18 DIAGNOSIS — R7301 Impaired fasting glucose: Secondary | ICD-10-CM

## 2023-03-18 DIAGNOSIS — J4540 Moderate persistent asthma, uncomplicated: Secondary | ICD-10-CM

## 2023-03-18 DIAGNOSIS — Z0001 Encounter for general adult medical examination with abnormal findings: Secondary | ICD-10-CM

## 2023-03-18 DIAGNOSIS — R000 Tachycardia, unspecified: Secondary | ICD-10-CM

## 2023-03-18 DIAGNOSIS — J0100 Acute maxillary sinusitis, unspecified: Secondary | ICD-10-CM

## 2023-03-18 DIAGNOSIS — R42 Dizziness and giddiness: Secondary | ICD-10-CM

## 2023-03-18 DIAGNOSIS — B351 Tinea unguium: Secondary | ICD-10-CM

## 2023-03-18 MED ORDER — CLOTRIMAZOLE-BETAMETHASONE 1-0.05 % EX CREA
1.0000 | TOPICAL_CREAM | Freq: Every day | CUTANEOUS | 2 refills | Status: DC
Start: 2023-03-18 — End: 2024-01-04

## 2023-03-18 MED ORDER — AZITHROMYCIN 250 MG PO TABS: ORAL_TABLET | ORAL | 0 refills | Status: AC

## 2023-03-18 NOTE — Progress Notes (Signed)
[facility] [address] [place], [place] [address]  Internal MEDICINE  Office Visit Note  Patient Name: [name]  [id number]  [id number]  Date of Service: 03/18/2023  Chief Complaint  Patient presents with   Gastroesophageal Reflux   Hypertension   Annual Exam    HPI [name] presents for an annual well visit and physical exam.  Well-appearing 52 y.o. female with hypertension, GERD, hyperlipidemia, impaired fasting glucose, asthma, and seizures.  Routine CRC screening: due in 2032 Routine mammogram: overdue now, was last done in September 2023 Labs: due for some labs. --A1c and lipid panel New or worsening pain: none Other concerns: Shingles vaccine completed last year 2 doses  Vertigo -- seen in ER waiting to see neuro and ENT Need to repeat A1c, her last one was normal 6 years ago.   Current Medication: Outpatient Encounter Medications as of 03/18/2023  Medication Sig   albuterol (PROVENTIL HFA;VENTOLIN HFA) 108 (90 BASE) MCG/ACT inhaler Inhale 2 puffs into the lungs every 6 (six) hours as needed for wheezing or shortness of breath.   albuterol (PROVENTIL) (2.5 MG/3ML) 0.083% nebulizer solution USE 1 VIAL IN NEBULIZER EVERY 4 HOURS AS NEEDED FOR WHEEZING AND FOR SHORTNESS OF BREATH (MIX WITH IPRATROPIUM)   [EXPIRED] azithromycin (ZITHROMAX) 250 MG tablet Take 2 tablets on day 1, then 1 tablet daily on days 2 through 5   butalbital-acetaminophen-caffeine (FIORICET) 50-325-40 MG tablet Take 1-2 tablets by mouth every 6 (six) hours as needed for headache.   clonazePAM (KLONOPIN) 0.5 MG tablet Take 0.5 mg by mouth 2 (two) times daily as needed for anxiety. Take 1/2 tablet by mouth twice a day   clotrimazole-betamethasone (LOTRISONE) cream Apply 1 Application topically daily. To affected areas of right foot until resolved.   gabapentin (NEURONTIN) 300 MG capsule Take 300 mg by mouth daily. Take 1 to 2 capsules by mouth nightly for sleep   ipratropium (ATROVENT)  0.02 % nebulizer solution TAKE 2.5MLS BY NEBULIZATION FOUR TIMES DAILY WITH ALBUTEROL   lamoTRIgine (LAMICTAL) 200 MG tablet Take 200 mg in the am, and 300 mg at night   meclizine (ANTIVERT) 25 MG tablet Take 1 tablet (25 mg total) by mouth 3 (three) times daily as needed for dizziness.   nortriptyline (PAMELOR) 10 MG capsule Take 10 mg by mouth. Take 1 tablet po  in the morning   nortriptyline (PAMELOR) 50 MG capsule Take 50 mg by mouth at bedtime. Take 1 capsule by mouth at bedtime   [DISCONTINUED] furosemide (LASIX) 40 MG tablet TAKE 1 & 1/2 (ONE & ONE-HALF) TABLETS BY MOUTH ONCE DAILY   [DISCONTINUED] irbesartan (AVAPRO) 150 MG tablet Take 1 tablet by mouth once daily   [DISCONTINUED] metoprolol succinate (TOPROL-XL) 50 MG 24 hr tablet TAKE 1 & 1/2 (ONE & ONE-HALF) TABLETS BY MOUTH ONCE DAILY   [DISCONTINUED] Semaglutide-Weight Management 0.5 MG/0.5ML SOAJ Inject 0.5 mg into the skin once a week for 28 days.   [DISCONTINUED] Semaglutide-Weight Management 1 MG/0.5ML SOAJ Inject 1 mg into the skin once a week for 28 days.   [DISCONTINUED] Semaglutide-Weight Management 1.7 MG/0.75ML SOAJ Inject 1.7 mg into the skin once a week for 28 days.   [DISCONTINUED] Semaglutide-Weight Management 2.4 MG/0.75ML SOAJ Inject 2.4 mg into the skin once a week.   No facility-administered encounter medications on file as of 03/18/2023.    Surgical History: Past Surgical History:  Procedure Laterality Date   ABDOMINAL HYSTERECTOMY N/A 06/09/2015   Procedure: HYSTERECTOMY ABDOMINAL with Bilateral Salpingectomy;  Surgeon:  Suzy Bouchard, MD;  Location: [facility] ORS;  Service: Gynecology;  Laterality: N/A;   CESAREAN SECTION  [id number]   COLONOSCOPY WITH PROPOFOL N/A 10/24/2020   Procedure: COLONOSCOPY WITH PROPOFOL;  Surgeon: Midge Minium, MD;  Location: [facility];  Service: Endoscopy;  Laterality: N/A;  sleep apnea   FINGER SURGERY  1992   GASTRIC BYPASS     INGUINAL HERNIA REPAIR Right 2010    REPLACEMENT TOTAL KNEE Right     Medical History: Past Medical History:  Diagnosis Date   Allergy    environmental   Anemia    Asthma    Bronchitis, acute    Complication of anesthesia    HAD ASTHMA ATTACK WHILE UNDER   Gastroesophageal reflux disease without esophagitis 11/02/2017   Hypertension    Seizure (HCC)    None this year (2022)   Sleep apnea    CPAP    Family History: Family History  Problem Relation Age of Onset   Diabetes Mother    Hypertension Mother    Cancer Mother    Congestive Heart Failure Father    Diabetes Father     Social History   Socioeconomic History   Marital status: Married    Spouse name: Not on file   Number of children: Not on file   Years of education: Not on file   Highest education level: Not on file  Occupational History   Not on file  Tobacco Use   Smoking status: Never   Smokeless tobacco: Never  Vaping Use   Vaping status: Never Used  Substance and Sexual Activity   Alcohol use: No   Drug use: No   Sexual activity: Yes  Other Topics Concern   Not on file  Social History Narrative   Not on file   Social Determinants of Health   Financial Resource Strain: Not on file  Food Insecurity: Not on file  Transportation Needs: Not on file  Physical Activity: Not on file  Stress: Not on file  Social Connections: Not on file  Intimate Partner Violence: Not on file      Review of Systems  Constitutional:  Positive for fatigue and unexpected weight change. Negative for activity change, appetite change, chills and fever.  HENT:  Positive for congestion, rhinorrhea, sinus pressure, sinus pain and sneezing. Negative for ear pain, sore throat and trouble swallowing.   Eyes: Negative.   Respiratory:  Positive for cough. Negative for chest tightness, shortness of breath and wheezing.   Cardiovascular: Negative.  Negative for chest pain.  Gastrointestinal: Negative.  Negative for abdominal pain, blood in stool, constipation,  diarrhea, nausea and vomiting.  Endocrine: Negative.   Genitourinary: Negative.  Negative for difficulty urinating, dysuria, frequency, hematuria and urgency.  Musculoskeletal: Negative.  Negative for arthralgias, back pain, joint swelling, myalgias and neck pain.  Skin: Negative.  Negative for rash and wound.  Allergic/Immunologic: Negative.  Negative for immunocompromised state.  Neurological: Negative.  Negative for dizziness, seizures, numbness and headaches.  Hematological: Negative.   Psychiatric/Behavioral: Negative.  Negative for behavioral problems, self-injury and suicidal ideas. The patient is not nervous/anxious.     Vital Signs: BP 111/67   Pulse 86   Temp 98.5 F (36.9 C)   Resp 16   Ht 5\' 2"  (1.575 m)   Wt 262 lb (118.8 kg)   LMP 05/28/2015   SpO2 96%   BMI 47.92 kg/m    Physical Exam Vitals reviewed.  Constitutional:      General: She  is not in acute distress.    Appearance: Normal appearance. She is obese. She is ill-appearing.  HENT:     Head: Normocephalic and atraumatic.     Right Ear: Tympanic membrane, ear canal and external ear normal.     Left Ear: Tympanic membrane, ear canal and external ear normal.     Nose: Congestion and rhinorrhea present.     Mouth/Throat:     Mouth: Mucous membranes are moist.     Pharynx: Posterior oropharyngeal erythema present.  Eyes:     Extraocular Movements: Extraocular movements intact.     Conjunctiva/sclera: Conjunctivae normal.     Pupils: Pupils are equal, round, and reactive to light.  Cardiovascular:     Rate and Rhythm: Normal rate and regular rhythm.     Pulses: Normal pulses.          Dorsalis pedis pulses are 2+ on the right side and 2+ on the left side.       Posterior tibial pulses are 2+ on the right side and 2+ on the left side.     Heart sounds: Normal heart sounds. No murmur heard. Pulmonary:     Effort: Pulmonary effort is normal. No respiratory distress.     Breath sounds: Normal breath sounds.  No wheezing.  Abdominal:     General: Bowel sounds are normal. There is no distension.     Palpations: Abdomen is soft.     Tenderness: There is no abdominal tenderness. There is no guarding.  Musculoskeletal:     Cervical back: Normal range of motion and neck supple.     Right lower leg: No edema.     Left lower leg: No edema.     Right foot: Normal range of motion. No deformity, bunion, Charcot foot, foot drop or prominent metatarsal heads.     Left foot: Normal range of motion. No deformity, bunion, Charcot foot, foot drop or prominent metatarsal heads.  Feet:     Right foot:     Protective Sensation: 6 sites tested.  6 sites sensed.     Skin integrity: Callus and dry skin present. No ulcer, blister, skin breakdown, erythema, warmth or fissure.     Toenail Condition: Right toenails are abnormally thick. Fungal disease present.    Left foot:     Protective Sensation: 6 sites tested.  6 sites sensed.     Skin integrity: Callus and dry skin present. No ulcer, blister, skin breakdown, erythema, warmth or fissure.     Toenail Condition: Left toenails are abnormally thick.  Lymphadenopathy:     Cervical: No cervical adenopathy.  Skin:    General: Skin is warm and dry.     Capillary Refill: Capillary refill takes less than 2 seconds.  Neurological:     Mental Status: She is alert and oriented to person, place, and time.     Cranial Nerves: No cranial nerve deficit.     Coordination: Coordination normal.     Gait: Gait normal.  Psychiatric:        Mood and Affect: Mood normal.        Behavior: Behavior normal.        Thought Content: Thought content normal.        Judgment: Judgment normal.        Assessment/Plan: 1. Encounter for routine adult health examination with abnormal findings Age-appropriate preventive screenings and vaccinations discussed, annual physical exam completed. Routine labs for health maintenance will be ordered. PHM updated.   -  clotrimazole-betamethasone  (LOTRISONE) cream; Apply 1 Application topically daily. To affected areas of right foot until resolved.  Dispense: 45 g; Refill: 2  2. Acute non-recurrent maxillary sinusitis Zpak prescribed, take until gone.  - azithromycin (ZITHROMAX) 250 MG tablet; Take 2 tablets on day 1, then 1 tablet daily on days 2 through 5  Dispense: 6 tablet; Refill: 0  3. IFG (impaired fasting glucose) Will repeat A1c to screen for diabetes which she does have a family history of   4. Vertigo Waiting to see ENT and neurology  5. Mixed hyperlipidemia Repeat lipid panel   6. Onychomycosis of right great toe Referred to podiatry - Ambulatory referral to Podiatry  7. Encounter for screening mammogram for malignant neoplasm of breast Routine mammogram ordered  - MM 3D SCREENING MAMMOGRAM BILATERAL BREAST; Future     General Counseling: Kerby verbalizes understanding of the findings of todays visit and agrees with plan of treatment. I have discussed any further diagnostic evaluation that may be needed or ordered today. We also reviewed her medications today. she has been encouraged to call the office with any questions or concerns that should arise related to todays visit.    Orders Placed This Encounter  Procedures   MM 3D SCREENING MAMMOGRAM BILATERAL BREAST   Ambulatory referral to Podiatry    Meds ordered this encounter  Medications   azithromycin (ZITHROMAX) 250 MG tablet    Sig: Take 2 tablets on day 1, then 1 tablet daily on days 2 through 5    Dispense:  6 tablet    Refill:  0   clotrimazole-betamethasone (LOTRISONE) cream    Sig: Apply 1 Application topically daily. To affected areas of right foot until resolved.    Dispense:  45 g    Refill:  2    Return in about 2 months (around 05/18/2023) for F/U, Kristena Wilhelmi PCP.   Total time spent:30 Minutes Time spent includes review of chart, medications, test results, and follow up plan with the patient.    Controlled Substance Database was  reviewed by me.  This patient was seen by Sallyanne Kuster, FNP-C in collaboration with Dr. Beverely Risen as a part of collaborative care agreement.  Haizel Gatchell R. Tedd Sias, MSN, FNP-C Internal medicine

## 2023-03-19 NOTE — Plan of Care (Signed)
[facility] Tonsillectomy/Adenoidectomy, Postoperative PEDS care plan entered in error.

## 2023-03-23 ENCOUNTER — Other Ambulatory Visit
Admission: RE | Admit: 2023-03-23 | Discharge: 2023-03-23 | Disposition: A | Discharge status: Home / Self Care | Payer: 59 | Attending: Nurse Practitioner | Admitting: Nurse Practitioner

## 2023-03-23 DIAGNOSIS — E782 Mixed hyperlipidemia: Secondary | ICD-10-CM | POA: Diagnosis not present

## 2023-03-23 DIAGNOSIS — R7301 Impaired fasting glucose: Secondary | ICD-10-CM | POA: Diagnosis present

## 2023-03-23 LAB — LIPID PANEL
Cholesterol: 195 mg/dL (ref 0–200)
HDL: 48 mg/dL (ref >40)
LDL Cholesterol: 113 mg/dL — ABNORMAL HIGH (ref 0–99)
Total CHOL/HDL Ratio: 4.1 {ratio}
Triglycerides: 168 mg/dL — ABNORMAL HIGH (ref <150)
VLDL: 34 mg/dL (ref 0–40)

## 2023-03-23 LAB — HEMOGLOBIN A1C
Hgb A1c MFr Bld: 6.6 % — ABNORMAL HIGH (ref 4.8–5.6)
Mean Plasma Glucose: 142.72 mg/dL

## 2023-03-24 ENCOUNTER — Telehealth: Payer: Self-pay

## 2023-03-25 ENCOUNTER — Other Ambulatory Visit: Payer: Self-pay | Admitting: Nurse Practitioner

## 2023-03-25 MED ORDER — AMOXICILLIN-POT CLAVULANATE 875-125 MG PO TABS: 1.0000 | ORAL_TABLET | Freq: Two times a day (BID) | ORAL | 0 refills | Status: AC

## 2023-03-25 NOTE — Telephone Encounter (Signed)
Pt notified that we sent Augmentin

## 2023-03-26 ENCOUNTER — Other Ambulatory Visit: Payer: Self-pay | Admitting: Nurse Practitioner

## 2023-03-26 MED ORDER — HYDROCODONE-ACETAMINOPHEN 5-325 MG PO TABS: 1.0000 | ORAL_TABLET | ORAL | 0 refills | Status: DC | PRN

## 2023-03-27 ENCOUNTER — Other Ambulatory Visit: Payer: Self-pay | Admitting: Nurse Practitioner

## 2023-03-27 DIAGNOSIS — I10 Essential (primary) hypertension: Secondary | ICD-10-CM

## 2023-04-13 ENCOUNTER — Other Ambulatory Visit: Payer: Self-pay | Admitting: Nurse Practitioner

## 2023-04-13 DIAGNOSIS — I10 Essential (primary) hypertension: Secondary | ICD-10-CM

## 2023-04-13 DIAGNOSIS — R600 Localized edema: Secondary | ICD-10-CM

## 2023-04-19 ENCOUNTER — Ambulatory Visit: Payer: Self-pay | Admitting: Podiatry

## 2023-05-07 ENCOUNTER — Encounter: Payer: Self-pay | Admitting: Nurse Practitioner

## 2023-05-09 ENCOUNTER — Encounter: Payer: Self-pay | Admitting: Ophthalmology

## 2023-05-09 NOTE — Anesthesia Preprocedure Evaluation (Addendum)
Anesthesia Evaluation  Patient identified by MRN, date of birth, ID band Patient awake    Reviewed: Allergy & Precautions, H&P , NPO status , Patient's Chart, lab work & pertinent test results  History of Anesthesia Complications (+) history of anesthetic complications  Airway Mallampati: III  TM Distance: >3 FB Neck ROM: Full    Dental no notable dental hx.    Pulmonary neg pulmonary ROS, asthma , sleep apnea    Pulmonary exam normal breath sounds clear to auscultation       Cardiovascular hypertension, Normal cardiovascular exam Rhythm:Regular Rate:Normal     Neuro/Psych  Headaches, Seizures -,  PSYCHIATRIC DISORDERS      negative neurological ROS  negative psych ROS   GI/Hepatic negative GI ROS, Neg liver ROS,GERD  ,,  Endo/Other  negative endocrine ROS    Renal/GU negative Renal ROS  negative genitourinary   Musculoskeletal negative musculoskeletal ROS (+)    Abdominal   Peds negative pediatric ROS (+)  Hematology negative hematology ROS (+) Blood dyscrasia, anemia   Anesthesia Other Findings Asthma  Anemia Hypertension  Complication of anesthesia Allergy Bronchitis, acute Gastroesophageal reflux disease without esophagitis  Seizure (HCC) Sleep apnea on CPAP Sinus tachycardia  Vertigo Morbid obesity    Reproductive/Obstetrics negative OB ROS                              Anesthesia Physical Anesthesia Plan  ASA: 3  Anesthesia Plan: MAC   Post-op Pain Management:    Induction: Intravenous  PONV Risk Score and Plan:   Airway Management Planned: Natural Airway and Nasal Cannula  Additional Equipment:   Intra-op Plan:   Post-operative Plan:   Informed Consent: I have reviewed the patients History and Physical, chart, labs and discussed the procedure including the risks, benefits and alternatives for the proposed anesthesia with the patient or authorized  representative who has indicated his/her understanding and acceptance.     Dental Advisory Given  Plan Discussed with: Anesthesiologist, CRNA and Surgeon  Anesthesia Plan Comments: (Patient consented for risks of anesthesia including but not limited to:  - adverse reactions to medications - damage to eyes, teeth, lips or other oral mucosa - nerve damage due to positioning  - sore throat or hoarseness - Damage to heart, brain, nerves, lungs, other parts of body or loss of life  Patient voiced understanding and assent.)        Anesthesia Quick Evaluation

## 2023-05-11 ENCOUNTER — Ambulatory Visit: Payer: 59 | Admitting: Cardiology

## 2023-05-12 NOTE — Discharge Instructions (Signed)

## 2023-05-17 ENCOUNTER — Ambulatory Visit: Payer: 59 | Admitting: Anesthesiology

## 2023-05-17 ENCOUNTER — Encounter: Payer: Self-pay | Admitting: Ophthalmology

## 2023-05-17 ENCOUNTER — Ambulatory Visit
Admission: RE | Admit: 2023-05-17 | Discharge: 2023-05-17 | Disposition: A | Discharge status: Home / Self Care | Payer: 59 | Source: Ambulatory Visit | Attending: Ophthalmology | Admitting: Ophthalmology

## 2023-05-17 ENCOUNTER — Encounter
Admission: RE | Disposition: A | Discharge status: Home / Self Care | Payer: Self-pay | Source: Ambulatory Visit | Attending: Ophthalmology

## 2023-05-17 ENCOUNTER — Other Ambulatory Visit: Payer: Self-pay

## 2023-05-17 DIAGNOSIS — I10 Essential (primary) hypertension: Secondary | ICD-10-CM | POA: Diagnosis not present

## 2023-05-17 DIAGNOSIS — H2511 Age-related nuclear cataract, right eye: Secondary | ICD-10-CM | POA: Diagnosis present

## 2023-05-17 DIAGNOSIS — G4730 Sleep apnea, unspecified: Secondary | ICD-10-CM | POA: Diagnosis not present

## 2023-05-17 DIAGNOSIS — J45909 Unspecified asthma, uncomplicated: Secondary | ICD-10-CM | POA: Diagnosis not present

## 2023-05-17 HISTORY — DX: Obstructive sleep apnea (adult) (pediatric): G47.33

## 2023-05-17 HISTORY — DX: Morbid (severe) obesity due to excess calories: E66.01

## 2023-05-17 HISTORY — DX: Dizziness and giddiness: R42

## 2023-05-17 HISTORY — PX: CATARACT EXTRACTION W/PHACO: SHX586

## 2023-05-17 SURGERY — PHACOEMULSIFICATION, CATARACT, WITH IOL INSERTION
Anesthesia: Monitor Anesthesia Care | Laterality: Right

## 2023-05-17 MED ORDER — MOXIFLOXACIN HCL 0.5 % OP SOLN
OPHTHALMIC | Status: DC | PRN
  Administered 2023-05-17: .2 mL via OPHTHALMIC

## 2023-05-17 MED ORDER — SIGHTPATH DOSE#1 BSS IO SOLN
INTRAOCULAR | Status: DC | PRN
  Administered 2023-05-17: 15 mL via INTRAOCULAR

## 2023-05-17 MED ORDER — LIDOCAINE HCL (PF) 2 % IJ SOLN
INTRAOCULAR | Status: DC | PRN
  Administered 2023-05-17: 4 mL via INTRAOCULAR

## 2023-05-17 MED ORDER — TETRACAINE HCL 0.5 % OP SOLN
1.0000 [drp] | OPHTHALMIC | Status: DC | PRN
  Administered 2023-05-17 (×3): 1 [drp] via OPHTHALMIC

## 2023-05-17 MED ORDER — TETRACAINE HCL 0.5 % OP SOLN
OPHTHALMIC | Status: AC
  Filled 2023-05-17: qty 4

## 2023-05-17 MED ORDER — SIGHTPATH DOSE#1 NA HYALUR & NA CHOND-NA HYALUR IO KIT
PACK | INTRAOCULAR | Status: DC | PRN
  Administered 2023-05-17: 1 via OPHTHALMIC

## 2023-05-17 MED ORDER — BRIMONIDINE TARTRATE-TIMOLOL 0.2-0.5 % OP SOLN
OPHTHALMIC | Status: DC | PRN
  Administered 2023-05-17: 1 [drp] via OPHTHALMIC

## 2023-05-17 MED ORDER — ARMC OPHTHALMIC DILATING DROPS
1.0000 | OPHTHALMIC | Status: DC | PRN
  Administered 2023-05-17 (×3): 1 via OPHTHALMIC

## 2023-05-17 MED ORDER — FENTANYL CITRATE (PF) 100 MCG/2ML IJ SOLN
INTRAMUSCULAR | Status: AC
  Filled 2023-05-17: qty 2

## 2023-05-17 MED ORDER — MIDAZOLAM HCL 2 MG/2ML IJ SOLN
INTRAMUSCULAR | Status: AC
  Filled 2023-05-17: qty 2

## 2023-05-17 MED ORDER — SIGHTPATH DOSE#1 BSS IO SOLN
INTRAOCULAR | Status: DC | PRN
  Administered 2023-05-17: 67 mL via OPHTHALMIC

## 2023-05-17 MED ORDER — MIDAZOLAM HCL 2 MG/2ML IJ SOLN
INTRAMUSCULAR | Status: DC | PRN
  Administered 2023-05-17: 2 mg via INTRAVENOUS

## 2023-05-17 MED ORDER — FENTANYL CITRATE (PF) 100 MCG/2ML IJ SOLN
INTRAMUSCULAR | Status: DC | PRN
  Administered 2023-05-17 (×4): 50 ug via INTRAVENOUS

## 2023-05-17 SURGICAL SUPPLY — 10 items
CATARACT SUITE SIGHTPATH (MISCELLANEOUS) ×1
DISSECTOR HYDRO NUCLEUS 50X22 (MISCELLANEOUS) ×1 IMPLANT
DRSG TEGADERM 2-3/8X2-3/4 SM (GAUZE/BANDAGES/DRESSINGS) ×1 IMPLANT
FEE CATARACT SUITE SIGHTPATH (MISCELLANEOUS) ×1 IMPLANT
GLOVE SURG SYN 7.5 E (GLOVE) ×1
GLOVE SURG SYN 7.5 PF PI (GLOVE) ×1 IMPLANT
GLOVE SURG SYN 8.5 E (GLOVE) ×1
GLOVE SURG SYN 8.5 PF PI (GLOVE) ×1 IMPLANT
LENS CLAREON 20.5 (Intraocular Lens) ×1 IMPLANT
LENS IOL CLRN 20.5 (Intraocular Lens) IMPLANT

## 2023-05-17 NOTE — Transfer of Care (Signed)
Immediate Anesthesia Transfer of Care Note  Patient: [name]  Procedure(s) Performed: CATARACT EXTRACTION PHACO AND INTRAOCULAR LENS PLACEMENT (IOC) RIGHT MALYUGIN OMIDRIA 9.07 00:47.9 (Right)  Patient Location: PACU  Anesthesia Type: MAC  Level of Consciousness: awake, alert  and patient cooperative  Airway and Oxygen Therapy: Patient Spontanous Breathing and Patient connected to supplemental oxygen  Post-op Assessment: Post-op Vital signs reviewed, Patient's Cardiovascular Status Stable, Respiratory Function Stable, Patent Airway and No signs of Nausea or vomiting  Post-op Vital Signs: Reviewed and stable  Complications: No notable events documented.

## 2023-05-17 NOTE — Anesthesia Postprocedure Evaluation (Signed)
Anesthesia Post Note  Patient: [name]  Procedure(s) Performed: CATARACT EXTRACTION PHACO AND INTRAOCULAR LENS PLACEMENT (IOC) RIGHT MALYUGIN OMIDRIA 9.07 00:47.9 (Right)  Patient location during evaluation: PACU Anesthesia Type: MAC Level of consciousness: awake and alert Pain management: pain level controlled Vital Signs Assessment: post-procedure vital signs reviewed and stable Respiratory status: spontaneous breathing, nonlabored ventilation, respiratory function stable and patient connected to nasal cannula oxygen Cardiovascular status: stable and blood pressure returned to baseline Postop Assessment: no apparent nausea or vomiting Anesthetic complications: no   No notable events documented.   Last Vitals:  Vitals:   05/17/23 1420 05/17/23 1425  BP:  118/75  Resp:    Temp: 36.6 C 36.7 C  SpO2:  95%    Last Pain:  Vitals:   05/17/23 1425  TempSrc:   PainSc: 0-No pain                 [name]

## 2023-05-17 NOTE — Op Note (Signed)
OPERATIVE NOTE  PAYTAN ROHRBACHER [id number] 05/17/2023   PREOPERATIVE DIAGNOSIS: Nuclear sclerotic cataract right eye. H25.11   POSTOPERATIVE DIAGNOSIS: Nuclear sclerotic cataract right eye. H25.11   PROCEDURE:  Phacoemusification with posterior chamber intraocular lens placement of the right eye  Ultrasound time: Procedure(s): CATARACT EXTRACTION PHACO AND INTRAOCULAR LENS PLACEMENT (IOC) RIGHT MALYUGIN OMIDRIA 9.07 00:47.9 (Right)  LENS:   Implant Name Type Inv. Item Serial No. Manufacturer Lot No. LRB No. Used Action  LENS CLAREON 20.5 - [id number] Intraocular Lens LENS CLAREON 20.5 [id number] SIGHTPATH  Right 1 Implanted      SURGEON:  Julious Payer. Rolley Sims, MD   ANESTHESIA:  Topical with tetracaine drops, augmented with 1% preservative-free intracameral lidocaine.   COMPLICATIONS:  None.   DESCRIPTION OF PROCEDURE:  The patient was identified in the holding room and transported to the operating room and placed in the supine position under the operating microscope.  The right eye was identified as the operative eye, which was prepped and draped in the usual sterile ophthalmic fashion.   A 1 millimeter clear-corneal paracentesis was made superotemporally. Preservative-free 1% lidocaine mixed with 1:1,000 bisulfite-free aqueous solution of epinephrine was injected into the anterior chamber. The anterior chamber was then filled with Viscoat viscoelastic. A 2.4 millimeter keratome was used to make a clear-corneal incision inferotemporally. A curvilinear capsulorrhexis was made with a cystotome and capsulorrhexis forceps. Balanced salt solution was used to hydrodissect and hydrodelineate the nucleus. Phacoemulsification was then used to remove the lens nucleus and epinucleus. The remaining cortex was then removed using the irrigation and aspiration handpiece. Provisc was then placed into the capsular bag to distend it for lens placement. A  +20.50 D [id number] intraocular lens was then injected  into the capsular bag. The remaining viscoelastic was aspirated.   Wounds were hydrated with balanced salt solution.  The anterior chamber was inflated to a physiologic pressure with balanced salt solution.  No wound leaks were noted. Vigamox was injected intracamerally.  Timolol and Brimonidine drops were applied to the eye.  The patient was taken to the recovery room in stable condition without complications of anesthesia or surgery.  Rolly Pancake [place] 05/17/2023, 2:19 PM

## 2023-05-17 NOTE — H&P (Signed)
[facility]   Primary Care Physician:  Sallyanne Kuster, NP Ophthalmologist: Dr. Deberah Pelton  Pre-Procedure History & Physical: HPI:  [name] is a 52 y.o. female here for cataract surgery.   Past Medical History:  Diagnosis Date   Allergy    environmental   Anemia    Asthma    Bronchitis, acute    Complication of anesthesia    HAD ASTHMA ATTACK WHILE UNDER   Gastroesophageal reflux disease without esophagitis 11/02/2017   Hypertension    Seizure (HCC)    None since 01/2023   Sleep apnea    CPAP   Vertigo    10/24    Past Surgical History:  Procedure Laterality Date   ABDOMINAL HYSTERECTOMY N/A 06/09/2015   Procedure: HYSTERECTOMY ABDOMINAL with Bilateral Salpingectomy;  Surgeon: Suzy Bouchard, MD;  Location: [facility];  Service: Gynecology;  Laterality: N/A;   CESAREAN SECTION  [id number]   COLONOSCOPY WITH PROPOFOL N/A 10/24/2020   Procedure: COLONOSCOPY WITH PROPOFOL;  Surgeon: Midge Minium, MD;  Location: [facility];  Service: Endoscopy;  Laterality: N/A;  sleep apnea   FINGER SURGERY  1992   GASTRIC BYPASS     INGUINAL HERNIA REPAIR Right 2010   REPLACEMENT TOTAL KNEE Right     Prior to Admission medications   Medication Sig Start Date End Date Taking? Authorizing Provider  albuterol (PROVENTIL HFA;VENTOLIN HFA) 108 (90 BASE) MCG/ACT inhaler Inhale 2 puffs into the lungs every 6 (six) hours as needed for wheezing or shortness of breath.   Yes [provider]  albuterol (PROVENTIL) (2.5 MG/3ML) 0.083% nebulizer solution USE 1 VIAL IN NEBULIZER EVERY 4 HOURS AS NEEDED FOR WHEEZING AND FOR SHORTNESS OF BREATH (MIX WITH IPRATROPIUM) 09/16/22  Yes Abernathy, Alyssa, NP  clonazePAM (KLONOPIN) 0.5 MG tablet Take 0.5 mg by mouth 2 (two) times daily as needed for anxiety. Take 1/2 tablet by mouth twice a day   Yes [provider]  clotrimazole-betamethasone (LOTRISONE) cream Apply 1 Application topically daily. To affected areas  of right foot until resolved. 03/18/23  Yes Abernathy, Alyssa, NP  furosemide (LASIX) 40 MG tablet TAKE 1 & 1/2 (ONE & ONE-HALF) TABLETS BY MOUTH ONCE DAILY 04/13/23  Yes Abernathy, Alyssa, NP  gabapentin (NEURONTIN) 300 MG capsule Take 300 mg by mouth daily. Take 1 to 2 capsules by mouth nightly for sleep   Yes [provider]  HYDROcodone-acetaminophen (NORCO/VICODIN) 5-325 MG tablet Take 1 tablet by mouth every 4 (four) hours as needed for severe pain (pain score 7-10). Patient not taking: Reported on 05/09/2023 03/26/23   Sallyanne Kuster, NP  ipratropium (ATROVENT) 0.02 % nebulizer solution TAKE 2.5MLS BY NEBULIZATION FOUR TIMES DAILY WITH ALBUTEROL 09/16/22  Yes Abernathy, Arlyss Repress, NP  irbesartan (AVAPRO) 150 MG tablet Take 1 tablet by mouth once daily 03/28/23  Yes Abernathy, Alyssa, NP  lamoTRIgine (LAMICTAL) 200 MG tablet Take 200 mg in the am, and 300 mg at night 06/25/22  Yes [provider]  meclizine (ANTIVERT) 25 MG tablet Take 1 tablet (25 mg total) by mouth 3 (three) times daily as needed for dizziness. 03/17/23  Yes Phineas Semen, MD  metoprolol succinate (TOPROL-XL) 50 MG 24 hr tablet TAKE 1 & 1/2 (ONE & ONE-HALF) TABLETS BY MOUTH ONCE DAILY 03/21/23  Yes Abernathy, Alyssa, NP  nortriptyline (PAMELOR) 10 MG capsule Take 10 mg by mouth. Take 1 tablet po  in the morning   Yes Morene Crocker, MD  nortriptyline (PAMELOR) 50 MG capsule Take 50  mg by mouth at bedtime. Take 1 capsule by mouth at bedtime   Yes [provider]  butalbital-acetaminophen-caffeine (FIORICET) 50-325-40 MG tablet Take 1-2 tablets by mouth every 6 (six) hours as needed for headache. Patient not taking: Reported on 05/09/2023 03/17/23 03/16/24  Phineas Semen, MD    Allergies as of 04/07/2023 - Review Complete 03/18/2023  Allergen Reaction Noted   Keppra [levetiracetam] Other (See Comments) 09/05/2019   Duloxetine  11/05/2019    Family History  Problem Relation Age of Onset    Diabetes Mother    Hypertension Mother    Cancer Mother    Congestive Heart Failure Father    Diabetes Father     Social History   Socioeconomic History   Marital status: Married    Spouse name: Not on file   Number of children: Not on file   Years of education: Not on file   Highest education level: Not on file  Occupational History   Not on file  Tobacco Use   Smoking status: Never   Smokeless tobacco: Never  Vaping Use   Vaping status: Never Used  Substance and Sexual Activity   Alcohol use: No   Drug use: No   Sexual activity: Yes  Other Topics Concern   Not on file  Social History Narrative   Not on file   Social Drivers of Health   Financial Resource Strain: Not on file  Food Insecurity: Not on file  Transportation Needs: Not on file  Physical Activity: Not on file  Stress: Not on file  Social Connections: Not on file  Intimate Partner Violence: Not on file    Review of Systems: See HPI, otherwise negative ROS  Physical Exam: Ht 5\' 2"  (1.575 m)   Wt 118.8 kg   LMP 05/28/2015   BMI 47.92 kg/m  General:   Alert, cooperative in NAD Head:  Normocephalic and atraumatic. Respiratory:  Normal work of breathing. Cardiovascular:  RRR  Impression/Plan: [name] is here for cataract surgery.  Risks, benefits, limitations, and alternatives regarding cataract surgery have been reviewed with the patient.  Questions have been answered.  All parties agreeable.   Estanislado Pandy, MD  05/17/2023, 11:37 AM

## 2023-05-17 NOTE — Anesthesia Preprocedure Evaluation (Addendum)
Anesthesia Evaluation  Patient identified by MRN, date of birth, ID band Patient awake    Reviewed: Allergy & Precautions, H&P , NPO status , Patient's Chart, lab work & pertinent test results  History of Anesthesia Complications (+) history of anesthetic complications  Airway Mallampati: III  TM Distance: >3 FB Neck ROM: Full    Dental no notable dental hx.    Pulmonary neg pulmonary ROS, asthma , sleep apnea    Pulmonary exam normal breath sounds clear to auscultation       Cardiovascular hypertension, negative cardio ROS Normal cardiovascular exam Rhythm:Regular Rate:Normal     Neuro/Psych  Headaches, Seizures -,  PSYCHIATRIC DISORDERS      negative neurological ROS  negative psych ROS   GI/Hepatic negative GI ROS, Neg liver ROS,GERD  ,,  Endo/Other  negative endocrine ROS    Renal/GU negative Renal ROS  negative genitourinary   Musculoskeletal negative musculoskeletal ROS (+)    Abdominal   Peds negative pediatric ROS (+)  Hematology negative hematology ROS (+) Blood dyscrasia, anemia   Anesthesia Other Findings Asthma             Anemia Previous cataract surgery 05-17-23 Dr. Juel Burrow. Lovely lady, anxious, prayed preop. Nice preop discussion.   Hypertension             Complication of anesthesia Allergy Bronchitis, acute Gastroesophageal reflux disease without esophagitis      Seizure (HCC) Sleep apnea on CPAP Sinus tachycardia  Vertigo Morbid obesity   Reproductive/Obstetrics negative OB ROS                              Anesthesia Physical Anesthesia Plan  ASA: 3  Anesthesia Plan: MAC   Post-op Pain Management:    Induction: Intravenous  PONV Risk Score and Plan:   Airway Management Planned: Natural Airway and Nasal Cannula  Additional Equipment:   Intra-op Plan:   Post-operative Plan:   Informed Consent: I have reviewed the patients History and Physical,  chart, labs and discussed the procedure including the risks, benefits and alternatives for the proposed anesthesia with the patient or authorized representative who has indicated his/her understanding and acceptance.     Dental Advisory Given  Plan Discussed with: Anesthesiologist, CRNA and Surgeon  Anesthesia Plan Comments: (Patient consented for risks of anesthesia including but not limited to:  - adverse reactions to medications - damage to eyes, teeth, lips or other oral mucosa - nerve damage due to positioning  - sore throat or hoarseness - Damage to heart, brain, nerves, lungs, other parts of body or loss of life  Patient voiced understanding and assent.)         Anesthesia Quick Evaluation

## 2023-05-18 ENCOUNTER — Encounter: Payer: Self-pay | Admitting: Ophthalmology

## 2023-05-18 ENCOUNTER — Other Ambulatory Visit: Payer: Self-pay

## 2023-05-18 NOTE — Discharge Instructions (Signed)

## 2023-05-30 ENCOUNTER — Ambulatory Visit
Admission: RE | Admit: 2023-05-30 | Discharge: 2023-05-30 | Disposition: A | Discharge status: Home / Self Care | Payer: 59 | Source: Ambulatory Visit | Attending: Ophthalmology | Admitting: Ophthalmology

## 2023-05-30 ENCOUNTER — Ambulatory Visit: Payer: 59 | Admitting: Anesthesiology

## 2023-05-30 ENCOUNTER — Encounter
Admission: RE | Disposition: A | Discharge status: Home / Self Care | Payer: Self-pay | Source: Ambulatory Visit | Attending: Ophthalmology

## 2023-05-30 ENCOUNTER — Other Ambulatory Visit: Payer: Self-pay

## 2023-05-30 ENCOUNTER — Encounter: Payer: Self-pay | Admitting: Ophthalmology

## 2023-05-30 DIAGNOSIS — K219 Gastro-esophageal reflux disease without esophagitis: Secondary | ICD-10-CM | POA: Insufficient documentation

## 2023-05-30 DIAGNOSIS — J45909 Unspecified asthma, uncomplicated: Secondary | ICD-10-CM | POA: Diagnosis not present

## 2023-05-30 DIAGNOSIS — I10 Essential (primary) hypertension: Secondary | ICD-10-CM | POA: Diagnosis not present

## 2023-05-30 DIAGNOSIS — Z6841 Body Mass Index (BMI) 40.0 and over, adult: Secondary | ICD-10-CM | POA: Insufficient documentation

## 2023-05-30 DIAGNOSIS — Z9884 Bariatric surgery status: Secondary | ICD-10-CM | POA: Insufficient documentation

## 2023-05-30 DIAGNOSIS — G4733 Obstructive sleep apnea (adult) (pediatric): Secondary | ICD-10-CM | POA: Insufficient documentation

## 2023-05-30 DIAGNOSIS — H2512 Age-related nuclear cataract, left eye: Secondary | ICD-10-CM | POA: Insufficient documentation

## 2023-05-30 HISTORY — PX: CATARACT EXTRACTION W/PHACO: SHX586

## 2023-05-30 SURGERY — PHACOEMULSIFICATION, CATARACT, WITH IOL INSERTION
Anesthesia: Monitor Anesthesia Care | Laterality: Left

## 2023-05-30 MED ORDER — SIGHTPATH DOSE#1 NA HYALUR & NA CHOND-NA HYALUR IO KIT
PACK | INTRAOCULAR | Status: DC | PRN
  Administered 2023-05-30: 1 via OPHTHALMIC

## 2023-05-30 MED ORDER — TETRACAINE HCL 0.5 % OP SOLN
OPHTHALMIC | Status: AC
  Filled 2023-05-30: qty 4

## 2023-05-30 MED ORDER — FENTANYL CITRATE (PF) 100 MCG/2ML IJ SOLN
INTRAMUSCULAR | Status: AC
  Filled 2023-05-30: qty 2

## 2023-05-30 MED ORDER — MOXIFLOXACIN HCL 0.5 % OP SOLN
OPHTHALMIC | Status: DC | PRN
  Administered 2023-05-30: .2 mL via OPHTHALMIC

## 2023-05-30 MED ORDER — ACETAMINOPHEN 500 MG PO TABS
ORAL_TABLET | ORAL | Status: AC
  Filled 2023-05-30: qty 2

## 2023-05-30 MED ORDER — MIDAZOLAM HCL 2 MG/2ML IJ SOLN
INTRAMUSCULAR | Status: DC | PRN
  Administered 2023-05-30: 2 mg via INTRAVENOUS

## 2023-05-30 MED ORDER — LIDOCAINE HCL (PF) 2 % IJ SOLN
INTRAOCULAR | Status: DC | PRN
  Administered 2023-05-30: 4 mL via INTRAOCULAR

## 2023-05-30 MED ORDER — SIGHTPATH DOSE#1 BSS IO SOLN
INTRAOCULAR | Status: DC | PRN
  Administered 2023-05-30: 15 mL via INTRAOCULAR

## 2023-05-30 MED ORDER — BRIMONIDINE TARTRATE-TIMOLOL 0.2-0.5 % OP SOLN
OPHTHALMIC | Status: DC | PRN
  Administered 2023-05-30: 1 [drp] via OPHTHALMIC

## 2023-05-30 MED ORDER — MIDAZOLAM HCL 2 MG/2ML IJ SOLN
INTRAMUSCULAR | Status: AC
  Filled 2023-05-30: qty 2

## 2023-05-30 MED ORDER — ARMC OPHTHALMIC DILATING DROPS
1.0000 | OPHTHALMIC | Status: DC | PRN
  Administered 2023-05-30 (×3): 1 via OPHTHALMIC

## 2023-05-30 MED ORDER — ARMC OPHTHALMIC DILATING DROPS
OPHTHALMIC | Status: AC
Start: 2023-05-30 — End: ?
  Filled 2023-05-30: qty 0.5

## 2023-05-30 MED ORDER — ACETAMINOPHEN 500 MG PO TABS
1000.0000 mg | ORAL_TABLET | Freq: Four times a day (QID) | ORAL | Status: DC | PRN
  Administered 2023-05-30: 1000 mg via ORAL

## 2023-05-30 MED ORDER — SIGHTPATH DOSE#1 BSS IO SOLN
INTRAOCULAR | Status: DC | PRN
  Administered 2023-05-30: 65 mL via OPHTHALMIC

## 2023-05-30 MED ORDER — TETRACAINE HCL 0.5 % OP SOLN
1.0000 [drp] | OPHTHALMIC | Status: DC | PRN
  Administered 2023-05-30 (×3): 1 [drp] via OPHTHALMIC

## 2023-05-30 MED ORDER — FENTANYL CITRATE (PF) 100 MCG/2ML IJ SOLN
INTRAMUSCULAR | Status: DC | PRN
  Administered 2023-05-30: 100 ug via INTRAVENOUS
  Administered 2023-05-30 (×2): 50 ug via INTRAVENOUS

## 2023-05-30 SURGICAL SUPPLY — 10 items
CATARACT SUITE SIGHTPATH (MISCELLANEOUS) ×1 IMPLANT
DISSECTOR HYDRO NUCLEUS 50X22 (MISCELLANEOUS) ×1 IMPLANT
DRSG TEGADERM 2-3/8X2-3/4 SM (GAUZE/BANDAGES/DRESSINGS) ×1 IMPLANT
FEE CATARACT SUITE SIGHTPATH (MISCELLANEOUS) ×1 IMPLANT
GLOVE SURG SYN 7.5 E (GLOVE) ×1 IMPLANT
GLOVE SURG SYN 7.5 PF PI (GLOVE) ×1 IMPLANT
GLOVE SURG SYN 8.5 E (GLOVE) ×1 IMPLANT
GLOVE SURG SYN 8.5 PF PI (GLOVE) ×1 IMPLANT
LENS CLAREON WAGON WHEEL 21.0 (Intraocular Lens) ×1 IMPLANT
LENS IOL CLRN WGN WHL 21.0 (Intraocular Lens) IMPLANT

## 2023-05-30 NOTE — Op Note (Signed)
OPERATIVE NOTE  [name] [id number] 05/30/2023   PREOPERATIVE DIAGNOSIS: Nuclear sclerotic cataract left eye. H25.12   POSTOPERATIVE DIAGNOSIS: Nuclear sclerotic cataract left eye. H25.12   PROCEDURE:  Phacoemusification with posterior chamber intraocular lens placement of the left eye  Ultrasound time: Procedure(s): CATARACT EXTRACTION PHACO AND INTRAOCULAR LENS PLACEMENT (IOC) LEFT [name] 3.73 00:32.0 (Left)  LENS:   Implant Name Type Inv. Item Serial No. Manufacturer Lot No. LRB No. Used Action  LENS CL[employer] WAGON WHEEL 21.0 - [id number] Intraocular Lens LENS CL[employer] WAGON WHEEL 21.0 [id number] [employer]  Left 1 Implanted      SURGEON:  Julious Payer. Rolley Sims, MD   ANESTHESIA:  Topical with tetracaine drops, augmented with 1% preservative-free intracameral lidocaine.   COMPLICATIONS:  None.   DESCRIPTION OF PROCEDURE:  The patient was identified in the holding room and transported to the operating room and placed in the supine position under the operating microscope.  The left eye was identified as the operative eye, which was prepped and draped in the usual sterile ophthalmic fashion.   A 1 millimeter clear-corneal paracentesis was made inferotemporally. Preservative-free 1% lidocaine mixed with 1:1,000 bisulfite-free aqueous solution of epinephrine was injected into the anterior chamber. The anterior chamber was then filled with Viscoat viscoelastic. A 2.4 millimeter keratome was used to make a clear-corneal incision superotemporally. A curvilinear capsulorrhexis was made with a cystotome and capsulorrhexis forceps. Balanced salt solution was used to hydrodissect and hydrodelineate the nucleus. Phacoemulsification was then used to remove the lens nucleus and epinucleus. The remaining cortex was then removed using the irrigation and aspiration handpiece. Provisc was then placed into the capsular bag to distend it for lens placement. A +21.00 D [id number] intraocular lens was then  injected into the capsular bag. The remaining viscoelastic was aspirated.   Wounds were hydrated with balanced salt solution.  The anterior chamber was inflated to a physiologic pressure with balanced salt solution.  No wound leaks were noted. Vigamox was injected intracamerally.  Timolol and Brimonidine drops were applied to the eye.  The patient was taken to the recovery room in stable condition without complications of anesthesia or surgery.  [name] 05/30/2023, 12:27 PM

## 2023-05-30 NOTE — Anesthesia Postprocedure Evaluation (Signed)
Anesthesia Post Note  Patient: [name]  Procedure(s) Performed: CATARACT EXTRACTION PHACO AND INTRAOCULAR LENS PLACEMENT (IOC) LEFT [name] 3.73 00:32.0 (Left)  Patient location during evaluation: PACU Anesthesia Type: MAC Level of consciousness: awake and alert Pain management: pain level controlled Vital Signs Assessment: post-procedure vital signs reviewed and stable Respiratory status: spontaneous breathing, nonlabored ventilation, respiratory function stable and patient connected to nasal cannula oxygen Cardiovascular status: stable and blood pressure returned to baseline Postop Assessment: no apparent nausea or vomiting Anesthetic complications: no   No notable events documented.   Last Vitals:  Vitals:   05/30/23 1230 05/30/23 1235  BP: (!) 94/58 (!) 155/95  Pulse: 75 76  Resp: 12 11  Temp:    SpO2: 98% 98%    Last Pain:  Vitals:   05/30/23 1235  TempSrc:   PainSc: 5                  [name] C Demica Zook

## 2023-05-30 NOTE — Transfer of Care (Signed)
Immediate Anesthesia Transfer of Care Note  Patient: [name]  Procedure(s) Performed: CATARACT EXTRACTION PHACO AND INTRAOCULAR LENS PLACEMENT (IOC) LEFT [name] 3.73 00:32.0 (Left)  Patient Location: PACU  Anesthesia Type: MAC  Level of Consciousness: awake, alert  and patient cooperative  Airway and Oxygen Therapy: Patient Spontanous Breathing and Patient connected to supplemental oxygen  Post-op Assessment: Post-op Vital signs reviewed, Patient's Cardiovascular Status Stable, Respiratory Function Stable, Patent Airway and No signs of Nausea or vomiting  Post-op Vital Signs: Reviewed and stable  Complications: No notable events documented.

## 2023-05-30 NOTE — H&P (Signed)
[facility]   Primary Care Physician:  Sallyanne Kuster, NP Ophthalmologist: Dr. Deberah Pelton  Pre-Procedure History & Physical: HPI:  [name] is a 52 y.o. female here for cataract surgery.   Past Medical History:  Diagnosis Date   Allergy    environmental   Anemia    Asthma    Bronchitis, acute    Complication of anesthesia    HAD ASTHMA ATTACK WHILE UNDER   Gastroesophageal reflux disease without esophagitis 11/02/2017   Hypertension    Morbid obesity with body mass index (BMI) of 45.0 to 49.9 in adult [facility])    Obstructive sleep apnea on CPAP    Seizure (HCC)    None since 01/2023   Sleep apnea    CPAP   Vertigo    10/24    Past Surgical History:  Procedure Laterality Date   ABDOMINAL HYSTERECTOMY N/A 06/09/2015   Procedure: HYSTERECTOMY ABDOMINAL with Bilateral Salpingectomy;  Surgeon: Suzy Bouchard, MD;  Location: [facility] ORS;  Service: Gynecology;  Laterality: N/A;   CATARACT EXTRACTION W/PHACO Right 05/17/2023   Procedure: CATARACT EXTRACTION PHACO AND INTRAOCULAR LENS PLACEMENT (IOC) RIGHT MALYUGIN OMIDRIA 9.07 00:47.9;  Surgeon: Estanislado Pandy, MD;  Location: [facility];  Service: Ophthalmology;  Laterality: Right;   CESAREAN SECTION  [id number]   COLONOSCOPY WITH PROPOFOL N/A 10/24/2020   Procedure: COLONOSCOPY WITH PROPOFOL;  Surgeon: Midge Minium, MD;  Location: [facility];  Service: Endoscopy;  Laterality: N/A;  sleep apnea   FINGER SURGERY  1992   GASTRIC BYPASS     INGUINAL HERNIA REPAIR Right 2010   REPLACEMENT TOTAL KNEE Right     Prior to Admission medications   Medication Sig Start Date End Date Taking? Authorizing Provider  HYDROcodone-acetaminophen (NORCO/VICODIN) 5-325 MG tablet Take 1 tablet by mouth every 4 (four) hours as needed for severe pain (pain score 7-10). Patient not taking: Reported on 05/09/2023 03/26/23   Sallyanne Kuster, NP  albuterol (PROVENTIL HFA;VENTOLIN HFA) 108 (90 BASE) MCG/ACT  inhaler Inhale 2 puffs into the lungs every 6 (six) hours as needed for wheezing or shortness of breath.    [provider]  albuterol (PROVENTIL) (2.5 MG/3ML) 0.083% nebulizer solution USE 1 VIAL IN NEBULIZER EVERY 4 HOURS AS NEEDED FOR WHEEZING AND FOR SHORTNESS OF BREATH (MIX WITH IPRATROPIUM) 09/16/22   Sallyanne Kuster, NP  butalbital-acetaminophen-caffeine (FIORICET) 50-325-40 MG tablet Take 1-2 tablets by mouth every 6 (six) hours as needed for headache. 03/17/23 03/16/24  Phineas Semen, MD  clonazePAM (KLONOPIN) 0.5 MG tablet Take 0.5 mg by mouth 2 (two) times daily as needed for anxiety. Take 1/2 tablet by mouth twice a day    [provider]  clotrimazole-betamethasone (LOTRISONE) cream Apply 1 Application topically daily. To affected areas of right foot until resolved. 03/18/23   Sallyanne Kuster, NP  furosemide (LASIX) 40 MG tablet TAKE 1 & 1/2 (ONE & ONE-HALF) TABLETS BY MOUTH ONCE DAILY 04/13/23   Sallyanne Kuster, NP  gabapentin (NEURONTIN) 300 MG capsule Take 300 mg by mouth daily. Take 1 to 2 capsules by mouth nightly for sleep    [provider]  ipratropium (ATROVENT) 0.02 % nebulizer solution TAKE 2.5MLS BY NEBULIZATION FOUR TIMES DAILY WITH ALBUTEROL 09/16/22   Sallyanne Kuster, NP  irbesartan (AVAPRO) 150 MG tablet Take 1 tablet by mouth once daily 03/28/23   Sallyanne Kuster, NP  lamoTRIgine (LAMICTAL) 200 MG tablet Take 200 mg in the am, and 300 mg at night 06/25/22   [provider]  meclizine (ANTIVERT) 25 MG tablet Take 1 tablet (25 mg total) by mouth 3 (three) times daily as needed for dizziness. Patient not taking: Reported on 05/17/2023 03/17/23   Phineas Semen, MD  metoprolol succinate (TOPROL-XL) 50 MG 24 hr tablet TAKE 1 & 1/2 (ONE & ONE-HALF) TABLETS BY MOUTH ONCE DAILY 03/21/23   Sallyanne Kuster, NP  nortriptyline (PAMELOR) 10 MG capsule Take 10 mg by mouth. Take 1 tablet po  in the morning    Morene Crocker, MD   nortriptyline (PAMELOR) 50 MG capsule Take 50 mg by mouth at bedtime. Take 1 capsule by mouth at bedtime    [provider]    Allergies as of 04/07/2023 - Review Complete 03/18/2023  Allergen Reaction Noted   Keppra [levetiracetam] Other (See Comments) 09/05/2019   Duloxetine  11/05/2019    Family History  Problem Relation Age of Onset   Diabetes Mother    Hypertension Mother    Cancer Mother    Congestive Heart Failure Father    Diabetes Father     Social History   Socioeconomic History   Marital status: Married    Spouse name: Not on file   Number of children: Not on file   Years of education: Not on file   Highest education level: Not on file  Occupational History   Not on file  Tobacco Use   Smoking status: Never   Smokeless tobacco: Never  Vaping Use   Vaping status: Never Used  Substance and Sexual Activity   Alcohol use: No   Drug use: No   Sexual activity: Yes  Other Topics Concern   Not on file  Social History Narrative   Not on file   Social Drivers of Health   Financial Resource Strain: Not on file  Food Insecurity: Not on file  Transportation Needs: Not on file  Physical Activity: Not on file  Stress: Not on file  Social Connections: Not on file  Intimate Partner Violence: Not on file    Review of Systems: See HPI, otherwise negative ROS  Physical Exam: LMP 05/28/2015  General:   Alert, cooperative in NAD Head:  Normocephalic and atraumatic. Respiratory:  Normal work of breathing. Cardiovascular:  RRR  Impression/Plan: [name] is here for cataract surgery.  Risks, benefits, limitations, and alternatives regarding cataract surgery have been reviewed with the patient.  Questions have been answered.  All parties agreeable.   Estanislado Pandy, MD  05/30/2023, 10:07 AM

## 2023-05-31 ENCOUNTER — Encounter: Payer: Self-pay | Admitting: Ophthalmology

## 2023-06-06 ENCOUNTER — Encounter: Payer: Self-pay | Admitting: Nurse Practitioner

## 2023-06-06 ENCOUNTER — Ambulatory Visit (INDEPENDENT_AMBULATORY_CARE_PROVIDER_SITE_OTHER): Payer: 59 | Admitting: Nurse Practitioner

## 2023-06-06 VITALS — BP 120/88 | HR 90 | Temp 98.1°F | Resp 16 | Ht 62.0 in | Wt 266.2 lb

## 2023-06-06 DIAGNOSIS — E785 Hyperlipidemia, unspecified: Secondary | ICD-10-CM | POA: Diagnosis not present

## 2023-06-06 DIAGNOSIS — E1169 Type 2 diabetes mellitus with other specified complication: Secondary | ICD-10-CM

## 2023-06-06 DIAGNOSIS — K625 Hemorrhage of anus and rectum: Secondary | ICD-10-CM

## 2023-06-06 MED ORDER — ACCU-CHEK GUIDE TEST VI STRP: ORAL_STRIP | 12 refills | Status: AC

## 2023-06-06 MED ORDER — ACCU-CHEK GUIDE ME W/DEVICE KIT: 1.0000 | PACK | Freq: Once | 0 refills | Status: AC

## 2023-06-06 MED ORDER — ACCU-CHEK SOFTCLIX LANCETS MISC: 12 refills | Status: AC

## 2023-06-06 NOTE — Progress Notes (Signed)
 [facility] [address] [place], [place] [address]  Internal MEDICINE  Office Visit Note  Patient Name: [name]  [id number]  [id number]  Date of Service: 06/06/2023  Chief Complaint  Patient presents with   Acute Visit    Back and leg pain x few days-- numb at butt and hips      HPI Falana presents for an acute sick visit for rectal bleeding and numbness --numbness of lower back and down her legs to her thighs just above her knees  --was seen by a PA at [facility] and was told to try miralax and stool softeners. This has not helped and the rectal bleeding has continued. She is now having pain when passing gas and pain with bowel movements and the numbness is a new symptom as well. Patient has noted blood in formed stool as well as when she has had diarrhea.  --lab results also discussed, A1c done in October is elevated at 6.6 which is diabetic. This is new for the patient. She is now diabetic.  --cholesterol panel from October is slightly improved from prior lab.     Current Medication:  Outpatient Encounter Medications as of 06/06/2023  Medication Sig   Accu-Chek Softclix Lancets lancets Use 1 lancet to check glucose three times daily as needed for diabetes   albuterol  (PROVENTIL  HFA;VENTOLIN  HFA) 108 (90 BASE) MCG/ACT inhaler Inhale 2 puffs into the lungs every 6 (six) hours as needed for wheezing or shortness of breath.   albuterol  (PROVENTIL ) (2.5 MG/3ML) 0.083% nebulizer solution USE 1 VIAL IN NEBULIZER EVERY 4 HOURS AS NEEDED FOR WHEEZING AND FOR SHORTNESS OF BREATH (MIX WITH IPRATROPIUM)   Blood Glucose Monitoring Suppl (ACCU-CHEK GUIDE ME) w/Device KIT 1 Device by Does not apply route once for 1 dose. For diabetes type 2 E11.65   butalbital -acetaminophen -caffeine  (FIORICET ) 50-325-40 MG tablet Take 1-2 tablets by mouth every 6 (six) hours as needed for headache.   clonazePAM (KLONOPIN) 0.5 MG tablet Take 0.5 mg by mouth 2 (two) times daily as needed for  anxiety. Take 1/2 tablet by mouth twice a day   clotrimazole -betamethasone  (LOTRISONE ) cream Apply 1 Application topically daily. To affected areas of right foot until resolved.   furosemide  (LASIX ) 40 MG tablet TAKE 1 & 1/2 (ONE & ONE-HALF) TABLETS BY MOUTH ONCE DAILY   gabapentin (NEURONTIN) 300 MG capsule Take 300 mg by mouth daily. Take 1 to 2 capsules by mouth nightly for sleep   glucose blood (ACCU-CHEK GUIDE TEST) test strip Use 1 test strip three times daily as needed to check glucose for type 2 diabetes E11.65   HYDROcodone -acetaminophen  (NORCO/VICODIN) 5-325 MG tablet Take 1 tablet by mouth every 4 (four) hours as needed for severe pain (pain score 7-10).   ipratropium (ATROVENT ) 0.02 % nebulizer solution TAKE 2.5MLS BY NEBULIZATION FOUR TIMES DAILY WITH ALBUTEROL    irbesartan  (AVAPRO ) 150 MG tablet Take 1 tablet by mouth once daily   lamoTRIgine (LAMICTAL) 200 MG tablet Take 200 mg in the am, and 300 mg at night   meclizine  (ANTIVERT ) 25 MG tablet Take 1 tablet (25 mg total) by mouth 3 (three) times daily as needed for dizziness.   metoprolol  succinate (TOPROL -XL) 50 MG 24 hr tablet TAKE 1 & 1/2 (ONE & ONE-HALF) TABLETS BY MOUTH ONCE DAILY   nortriptyline (PAMELOR) 10 MG capsule Take 10 mg by mouth. Take 1 tablet po  in the morning   nortriptyline (PAMELOR) 50 MG capsule Take 50 mg by mouth at bedtime.  Take 1 capsule by mouth at bedtime   No facility-administered encounter medications on file as of 06/06/2023.      Medical History: Past Medical History:  Diagnosis Date   Allergy    environmental   Anemia    Asthma    Bronchitis, acute    Complication of anesthesia    HAD ASTHMA ATTACK WHILE UNDER   Gastroesophageal reflux disease without esophagitis 11/02/2017   Hypertension    Morbid obesity with body mass index (BMI) of 45.0 to 49.9 in adult [facility])    Obstructive sleep apnea on CPAP    Seizure (HCC)    None since 01/2023   Sleep apnea    CPAP   Vertigo    10/24      Vital Signs: BP 120/88   Pulse (!) 107   Temp 98.1 F (36.7 C)   Resp 16   Ht 5' 2 (1.575 m)   Wt 266 lb 3.2 oz (120.7 kg)   LMP 05/28/2015   SpO2 98%   BMI 48.69 kg/m    Review of Systems  Constitutional: Negative.  Negative for appetite change, diaphoresis, fatigue and unexpected weight change.  Respiratory: Negative.  Negative for cough, chest tightness, shortness of breath and wheezing.   Cardiovascular: Negative.  Negative for chest pain and palpitations.  Gastrointestinal:  Positive for anal bleeding, blood in stool and rectal pain. Negative for abdominal distention, abdominal pain, constipation, diarrhea, nausea and vomiting.  Genitourinary: Negative.  Negative for decreased urine volume, dysuria, frequency, hematuria, pelvic pain and urgency.  Musculoskeletal: Negative.   Neurological:  Positive for numbness (of buttocks and upper legs).  Psychiatric/Behavioral: Negative.      Physical Exam Vitals reviewed.  Constitutional:      General: She is not in acute distress.    Appearance: Normal appearance. She is obese. She is not ill-appearing.  HENT:     Head: Normocephalic and atraumatic.  Eyes:     Pupils: Pupils are equal, round, and reactive to light.  Cardiovascular:     Rate and Rhythm: Normal rate and regular rhythm.  Pulmonary:     Effort: Pulmonary effort is normal. No respiratory distress.  Neurological:     Mental Status: She is alert and oriented to person, place, and time.  Psychiatric:        Mood and Affect: Mood normal.        Behavior: Behavior normal.       Assessment/Plan: 1. Type 2 diabetes mellitus with other specified complication, without long-term current use of insulin (HCC) No medications prescribed yet. Discussed diet control and modifications that will help control glucose levels. Patient to check fasting glucose every morning and write down in log. Will bring log to next office visit in 1 month. Patient was also given  information about diabets, nutrition and the symptoms of low and high blood sugar. Will discuss further and answer any questions at her next office visit.  - Blood Glucose Monitoring Suppl (ACCU-CHEK GUIDE ME) w/Device KIT; 1 Device by Does not apply route once for 1 dose. For diabetes type 2 E11.65  Dispense: 1 kit; Refill: 0 - Accu-Chek Softclix Lancets lancets; Use 1 lancet to check glucose three times daily as needed for diabetes  Dispense: 100 each; Refill: 12 - glucose blood (ACCU-CHEK GUIDE TEST) test strip; Use 1 test strip three times daily as needed to check glucose for type 2 diabetes E11.65  Dispense: 100 each; Refill: 12  2. Hyperlipidemia associated with type 2 diabetes  mellitus (HCC) Not currently on any statin therapy but cholesterol panel has shown some improvement. Will discuss treatment further at follow up appt.   3. Rectal bleeding (Primary) Instructed patient to call [facility] for follow up appointment and ask to be scheduled with Dr. Therisa or Dr. Unk.    General Counseling: Yuritzy verbalizes understanding of the findings of todays visit and agrees with plan of treatment. I have discussed any further diagnostic evaluation that may be needed or ordered today. We also reviewed her medications today. she has been encouraged to call the office with any questions or concerns that should arise related to todays visit.    Counseling:    No orders of the defined types were placed in this encounter.   Meds ordered this encounter  Medications   Blood Glucose Monitoring Suppl (ACCU-CHEK GUIDE ME) w/Device KIT    Sig: 1 Device by Does not apply route once for 1 dose. For diabetes type 2 E11.65    Dispense:  1 kit    Refill:  0    Fill new script today   Accu-Chek Softclix Lancets lancets    Sig: Use 1 lancet to check glucose three times daily as needed for diabetes    Dispense:  100 each    Refill:  12   glucose blood (ACCU-CHEK GUIDE TEST) test strip    Sig: Use 1  test strip three times daily as needed to check glucose for type 2 diabetes E11.65    Dispense:  100 each    Refill:  12    Dx code E11.65    Return in about 1 month (around 07/07/2023) for F/U, Eliabeth Shoff PCP diabetes. reschedule appt on 1/17 for a month from now. .  [place] Controlled Substance Database was reviewed by me for overdose risk score (ORS)  Time spent:30 Minutes Time spent with patient included reviewing progress notes, labs, imaging studies, and discussing plan for follow up.   This patient was seen by Mardy Maxin, FNP-C in collaboration with Dr. Sigrid Bathe as a part of collaborative care agreement.  Atalya Dano R. Maxin, MSN, FNP-C Internal Medicine

## 2023-06-07 ENCOUNTER — Encounter: Payer: Self-pay | Admitting: Nurse Practitioner

## 2023-06-07 DIAGNOSIS — E1169 Type 2 diabetes mellitus with other specified complication: Secondary | ICD-10-CM | POA: Insufficient documentation

## 2023-06-07 DIAGNOSIS — E119 Type 2 diabetes mellitus without complications: Secondary | ICD-10-CM | POA: Insufficient documentation

## 2023-06-09 ENCOUNTER — Ambulatory Visit: Payer: Self-pay | Admitting: Podiatry

## 2023-06-17 ENCOUNTER — Ambulatory Visit: Payer: 59 | Admitting: Nurse Practitioner

## 2023-06-20 ENCOUNTER — Ambulatory Visit (INDEPENDENT_AMBULATORY_CARE_PROVIDER_SITE_OTHER): Payer: 59 | Admitting: Podiatry

## 2023-06-20 ENCOUNTER — Encounter: Payer: Self-pay | Admitting: Podiatry

## 2023-06-20 VITALS — Ht 62.0 in | Wt 266.2 lb

## 2023-06-20 DIAGNOSIS — B351 Tinea unguium: Secondary | ICD-10-CM | POA: Diagnosis not present

## 2023-06-20 MED ORDER — TERBINAFINE HCL 250 MG PO TABS: 250.0000 mg | ORAL_TABLET | Freq: Every day | ORAL | 0 refills | Status: AC

## 2023-06-20 NOTE — Patient Instructions (Signed)
VISIT SUMMARY:  During today's visit, we discussed the issues with your toenail and foot. You have a fungal infection in your toenail and athlete's foot, both of which are being treated. We also reviewed your medical history, including your gastric bypass surgery and knee replacement.  YOUR PLAN:  -ONYCHOMYCOSIS: Onychomycosis is a fungal infection of the toenail, causing discoloration and debris under the nail. We will start you on an oral antifungal medication called Lamisil (terbinafine) for 90 days. Please avoid scraping or cleaning too far back under the toenail to prevent further issues. We will review your progress in 3 months. If there is no improvement after 1-2 rounds of treatment, we may consider nail removal.  -TINEA PEDIS (ATHLETE'S FOOT): Tinea Pedis, commonly known as athlete's foot, is a fungal infection that causes itching and discoloration between the toes. Your symptoms are improving, so please continue your current treatment. We will review your progress in 3 months.  INSTRUCTIONS:  Please follow up in 3 months to review the progress of your treatments. If you experience any new symptoms or have concerns before then, do not hesitate to contact our office.

## 2023-06-20 NOTE — Progress Notes (Signed)
    Subjective:  Patient ID: [name], female    DOB: 03-21-1971,  MRN: [id number]  Chief Complaint  Patient presents with   Nail Problem    Pt is here due to toenail discoloration on her right foot    Discussed the use of AI scribe software for clinical note transcription with the patient, who gave verbal consent to proceed.  History of Present Illness   The patient presents with a history of a previously detached toenail that has since regrown abnormally. She reports that the toenail was diagnosed as fungal by a previous healthcare provider. She also reports a history of athlete's foot, which was treated with an over-the-counter medication that did not resolve the issue. The patient describes itching, particularly in the area between the toes. She also notes that the affected toenail became dark and black, but did not peel or cause pain. The patient's daughter attempted to clean the area, which resulted in a white discharge. The patient also reports a history of gastric bypass surgery and is unable to take NSAIDs. She has a history of knee replacement and requires prophylactic antibiotics prior to dental procedures.          Objective:    Physical Exam   SKIN: Right foot warm and well perfused, good palpable pulses, capillary fill time intact. Significant discoloration and mycotic fungal debris under right toenail with brown discoloration. Melanonychia present in fifth toenail.           Results   LABS Liver function tests: Within normal limits (07/2022) Kidney function tests: Within normal limits (07/2022)      Assessment:   1. Onychomycosis      Plan:  Patient was evaluated and treated and all questions answered.  Assessment and Plan    Onychomycosis   Significant discoloration and mycotic fungal debris are present under the right toenail, with brown discoloration and melanonychia in the fifth toenail. Discussed the benefits and potential side effects of oral  antifungal treatment with Lamisil (terbinafine). Start Lamisil for a 90-day course. Review progress in 3 months. Advise avoiding scraping or cleaning too far back under the toenail to prevent long-term detachment issues. If no improvement after 1-2 rounds of treatment, consider nail removal.  Tinea Pedis (Athlete's Foot)   Itching and discoloration in the foot are improving. Continue current treatment. Review progress in 3 months.          Return in about 3 months (around 09/18/2023) for follow up after nail fungus treatment.

## 2023-06-21 ENCOUNTER — Ambulatory Visit (INDEPENDENT_AMBULATORY_CARE_PROVIDER_SITE_OTHER): Payer: 59 | Admitting: Gastroenterology

## 2023-06-21 ENCOUNTER — Other Ambulatory Visit: Payer: Self-pay

## 2023-06-21 VITALS — BP 123/88 | HR 90 | Temp 97.6°F | Wt 266.0 lb

## 2023-06-21 DIAGNOSIS — K625 Hemorrhage of anus and rectum: Secondary | ICD-10-CM

## 2023-06-21 NOTE — Progress Notes (Signed)
Wyline Mood MD, MRCP(U.K) [address]  [address]  [place], [place] [address]  Main: [phone]  Fax: [phone]   Primary Care Physician: Sallyanne Kuster, NP  Primary Gastroenterologist:  Dr. Wyline Mood   Chief Complaint  Patient presents with   numbness on rectum    HPI: [name] is a 53 y.o. female Summary of history :  H/o rectal bleeding with colonoscopy in 09/2020 showing internal hemorroids . Treated with conservative measures.   Seen by Celso Amy in July 2024 treated conservatively for her internal hemorrhoids.  03/17/2023 hemoglobin 12.3 g iron studies normal. She says that she has had blood mixed with the stools significant amount.  Denies any NSAID use.  She is says that the shape of her stool also has changed.  Some degree of urgency.  And sensation of incomplete bowel movement Current Outpatient Medications  Medication Sig Dispense Refill   Accu-Chek Softclix Lancets lancets Use 1 lancet to check glucose three times daily as needed for diabetes 100 each 12   albuterol (PROVENTIL HFA;VENTOLIN HFA) 108 (90 BASE) MCG/ACT inhaler Inhale 2 puffs into the lungs every 6 (six) hours as needed for wheezing or shortness of breath.     albuterol (PROVENTIL) (2.5 MG/3ML) 0.083% nebulizer solution USE 1 VIAL IN NEBULIZER EVERY 4 HOURS AS NEEDED FOR WHEEZING AND FOR SHORTNESS OF BREATH (MIX WITH IPRATROPIUM) 90 mL 0   clonazePAM (KLONOPIN) 0.5 MG tablet Take 0.5 mg by mouth 2 (two) times daily as needed for anxiety. Take 1/2 tablet by mouth twice a day     clotrimazole-betamethasone (LOTRISONE) cream Apply 1 Application topically daily. To affected areas of right foot until resolved. 45 g 2   furosemide (LASIX) 40 MG tablet TAKE 1 & 1/2 (ONE & ONE-HALF) TABLETS BY MOUTH ONCE DAILY 45 tablet 3   gabapentin (NEURONTIN) 300 MG capsule Take 300 mg by mouth daily. Take 1 to 2 capsules by mouth nightly for sleep     glucose blood (ACCU-CHEK GUIDE TEST) test  strip Use 1 test strip three times daily as needed to check glucose for type 2 diabetes E11.65 100 each 12   ipratropium (ATROVENT) 0.02 % nebulizer solution TAKE 2.5MLS BY NEBULIZATION FOUR TIMES DAILY WITH ALBUTEROL 90 mL 0   irbesartan (AVAPRO) 150 MG tablet Take 1 tablet by mouth once daily 30 tablet 5   lamoTRIgine (LAMICTAL) 200 MG tablet Take 200 mg in the am, and 300 mg at night     metoprolol succinate (TOPROL-XL) 50 MG 24 hr tablet TAKE 1 & 1/2 (ONE & ONE-HALF) TABLETS BY MOUTH ONCE DAILY 45 tablet 3   nortriptyline (PAMELOR) 10 MG capsule Take 10 mg by mouth. Take 1 tablet po  in the morning     nortriptyline (PAMELOR) 50 MG capsule Take 50 mg by mouth at bedtime. Take 1 capsule by mouth at bedtime     terbinafine (LAMISIL) 250 MG tablet Take 1 tablet (250 mg total) by mouth daily. 90 tablet 0   No current facility-administered medications for this visit.    Allergies as of 06/21/2023 - Review Complete 06/21/2023  Allergen Reaction Noted   Keppra [levetiracetam] Other (See Comments) 09/05/2019   Duloxetine  11/05/2019      ROS:  General: Negative for anorexia, weight loss, fever, chills, fatigue, weakness. ENT: Negative for hoarseness, difficulty swallowing , nasal congestion. CV: Negative for chest pain, angina, palpitations, dyspnea on exertion, peripheral edema.  Respiratory: Negative for dyspnea at rest,  dyspnea on exertion, cough, sputum, wheezing.  GI: See history of present illness. GU:  Negative for dysuria, hematuria, urinary incontinence, urinary frequency, nocturnal urination.  Endo: Negative for unusual weight change.    Physical Examination:   BP 123/88   Pulse 90   Temp 97.6 F (36.4 C) (Oral)   Wt 266 lb (120.7 kg)   LMP 05/28/2015   BMI 48.65 kg/m   General: Well-nourished, well-developed in no acute distress.  Eyes: No icterus. Conjunctivae pink. Mouth: Oropharyngeal mucosa moist and pink , no lesions erythema or exudate. Lungs: Clear to  auscultation bilaterally. Non-labored. Heart: Regular rate and rhythm, no murmurs rubs or gallops.  Abdomen: Bowel sounds are normal, nontender, nondistended, no hepatosplenomegaly or masses, no abdominal bruits or hernia , no rebound or guarding.   Extremities: No lower extremity edema. No clubbing or deformities. Neuro: Alert and oriented x 3.  Grossly intact. Skin: Warm and dry, no jaundice.   Psych: Alert and cooperative, normal mood and affect.   Imaging Studies: No results found.  Assessment and Plan:   [name] is a 53 y.o. y/o female here to follow-up for rectal bleeding.  Her history suggests a maybe more to the rectal bleeding than internal hemorrhoids.  There is some concern if this could be colitis.  She had a sensation of incomplete evacuation and I would like to at least perform a sigmoidoscopy unsedated to confirm that this is truly from hemorrhoids before we proceed with banding.  Will perform it unsedated.  I have discussed alternative options, risks & benefits,  which include, but are not limited to, bleeding, infection, perforation,respiratory complication & drug reaction.  The patient agrees with this plan & written consent will be obtained.     Dr Wyline Mood  MD,MRCP [facility]) Follow up in 8 weeks

## 2023-06-24 ENCOUNTER — Ambulatory Visit: Payer: 59 | Admitting: Cardiology

## 2023-06-30 ENCOUNTER — Encounter: Payer: Self-pay | Admitting: Gastroenterology

## 2023-06-30 ENCOUNTER — Encounter: Payer: Self-pay | Admitting: Orthopedic Surgery

## 2023-07-06 ENCOUNTER — Ambulatory Visit: Payer: Self-pay | Admitting: *Deleted

## 2023-07-06 ENCOUNTER — Other Ambulatory Visit: Payer: Self-pay | Admitting: Orthopedic Surgery

## 2023-07-07 ENCOUNTER — Ambulatory Visit: Payer: 59 | Admitting: Anesthesiology

## 2023-07-07 ENCOUNTER — Encounter
Admission: RE | Disposition: A | Discharge status: Home / Self Care | Payer: Self-pay | Source: Ambulatory Visit | Attending: Gastroenterology

## 2023-07-07 ENCOUNTER — Ambulatory Visit
Admission: RE | Admit: 2023-07-07 | Discharge: 2023-07-07 | Disposition: A | Discharge status: Home / Self Care | Payer: 59 | Source: Ambulatory Visit | Attending: Gastroenterology | Admitting: Gastroenterology

## 2023-07-07 ENCOUNTER — Ambulatory Visit: Payer: 59 | Admitting: Nurse Practitioner

## 2023-07-07 ENCOUNTER — Other Ambulatory Visit: Payer: Self-pay

## 2023-07-07 DIAGNOSIS — R569 Unspecified convulsions: Secondary | ICD-10-CM | POA: Diagnosis not present

## 2023-07-07 DIAGNOSIS — Z6841 Body Mass Index (BMI) 40.0 and over, adult: Secondary | ICD-10-CM | POA: Insufficient documentation

## 2023-07-07 DIAGNOSIS — I10 Essential (primary) hypertension: Secondary | ICD-10-CM | POA: Insufficient documentation

## 2023-07-07 DIAGNOSIS — E119 Type 2 diabetes mellitus without complications: Secondary | ICD-10-CM | POA: Insufficient documentation

## 2023-07-07 DIAGNOSIS — Z9884 Bariatric surgery status: Secondary | ICD-10-CM | POA: Diagnosis not present

## 2023-07-07 DIAGNOSIS — E66813 Obesity, class 3: Secondary | ICD-10-CM | POA: Diagnosis not present

## 2023-07-07 DIAGNOSIS — G4733 Obstructive sleep apnea (adult) (pediatric): Secondary | ICD-10-CM | POA: Insufficient documentation

## 2023-07-07 DIAGNOSIS — K625 Hemorrhage of anus and rectum: Secondary | ICD-10-CM | POA: Diagnosis present

## 2023-07-07 DIAGNOSIS — K640 First degree hemorrhoids: Secondary | ICD-10-CM | POA: Diagnosis not present

## 2023-07-07 HISTORY — PX: FLEXIBLE SIGMOIDOSCOPY: SHX5431

## 2023-07-07 SURGERY — SIGMOIDOSCOPY, FLEXIBLE
Anesthesia: General

## 2023-07-07 MED ORDER — PROPOFOL 10 MG/ML IV BOLUS
INTRAVENOUS | Status: DC | PRN
  Administered 2023-07-07: 30 mg via INTRAVENOUS
  Administered 2023-07-07: 70 mg via INTRAVENOUS

## 2023-07-07 MED ORDER — PROPOFOL 500 MG/50ML IV EMUL
INTRAVENOUS | Status: DC | PRN
  Administered 2023-07-07: 150 ug/kg/min via INTRAVENOUS

## 2023-07-07 MED ORDER — SODIUM CHLORIDE 0.9 % IV SOLN: INTRAVENOUS | Status: DC

## 2023-07-07 MED ORDER — LIDOCAINE HCL (CARDIAC) PF 100 MG/5ML IV SOSY
PREFILLED_SYRINGE | INTRAVENOUS | Status: DC | PRN
  Administered 2023-07-07: 50 mg via INTRAVENOUS

## 2023-07-07 NOTE — H&P (Signed)
 Ruel Kung, MD [address], [address], [place], [place], [address] [address], [address], [place], [place], [address] Phone: [phone]  Fax: [phone]  Primary Care Physician:  Liana Fish, NP   Pre-Procedure History & Physical: HPI:  [name] is a 53 y.o. female is here for a flexible sigmoidoscopy    Past Medical History:  Diagnosis Date   Allergy    environmental   Anemia    Asthma    Bronchitis, acute    Complication of anesthesia    HAD ASTHMA ATTACK WHILE UNDER   Gastroesophageal reflux disease without esophagitis 11/02/2017   Hypertension    Morbid obesity with body mass index (BMI) of 45.0 to 49.9 in adult [facility])    Obstructive sleep apnea on CPAP    Seizure (HCC)    None since 01/2023   Sleep apnea    CPAP   Vertigo    10/24    Past Surgical History:  Procedure Laterality Date   ABDOMINAL HYSTERECTOMY N/A 06/09/2015   Procedure: HYSTERECTOMY ABDOMINAL with Bilateral Salpingectomy;  Surgeon: Debby JINNY Dinsmore, MD;  Location: [facility] ORS;  Service: Gynecology;  Laterality: N/A;   CATARACT EXTRACTION W/PHACO Right 05/17/2023   Procedure: CATARACT EXTRACTION PHACO AND INTRAOCULAR LENS PLACEMENT (IOC) RIGHT MALYUGIN OMIDRIA 9.07 00:47.9;  Surgeon: Enola Feliciano Hugger, MD;  Location: [facility];  Service: Ophthalmology;  Laterality: Right;   CATARACT EXTRACTION W/PHACO Left 05/30/2023   Procedure: CATARACT EXTRACTION PHACO AND INTRAOCULAR LENS PLACEMENT (IOC) LEFT [name] 3.73 00:32.0;  Surgeon: Enola Feliciano Hugger, MD;  Location: [facility];  Service: Ophthalmology;  Laterality: Left;   CESAREAN SECTION  [id number],8006   COLONOSCOPY WITH PROPOFOL  N/A 10/24/2020   Procedure: COLONOSCOPY WITH PROPOFOL ;  Surgeon: Jinny [place], MD;  Location: [facility];  Service: Endoscopy;  Laterality: N/A;  sleep apnea   FINGER SURGERY  1992   GASTRIC BYPASS     INGUINAL HERNIA REPAIR Right 2010   REPLACEMENT TOTAL KNEE Right      Prior to Admission medications   Medication Sig Start Date End Date Taking? Authorizing Provider  Accu-Chek Softclix Lancets lancets Use 1 lancet to check glucose three times daily as needed for diabetes 06/06/23   Liana Fish, NP  albuterol  (PROVENTIL  HFA;VENTOLIN  HFA) 108 (90 BASE) MCG/ACT inhaler Inhale 2 puffs into the lungs every 6 (six) hours as needed for wheezing or shortness of breath.    [provider]  albuterol  (PROVENTIL ) (2.5 MG/3ML) 0.083% nebulizer solution USE 1 VIAL IN NEBULIZER EVERY 4 HOURS AS NEEDED FOR WHEEZING AND FOR SHORTNESS OF BREATH (MIX WITH IPRATROPIUM) 09/16/22   Abernathy, Alyssa, NP  clonazePAM (KLONOPIN) 0.5 MG tablet Take 0.5 mg by mouth 2 (two) times daily as needed for anxiety. Take 1/2 tablet by mouth twice a day    [provider]  clotrimazole -betamethasone  (LOTRISONE ) cream Apply 1 Application topically daily. To affected areas of right foot until resolved. Patient not taking: Reported on 06/30/2023 03/18/23   Liana Fish, NP  furosemide  (LASIX ) 40 MG tablet TAKE 1 & 1/2 (ONE & ONE-HALF) TABLETS BY MOUTH ONCE DAILY 04/13/23   Abernathy, Alyssa, NP  gabapentin (NEURONTIN) 300 MG capsule Take 300 mg by mouth daily. Take 1 to 2 capsules by mouth nightly for sleep    [provider]  glucose blood (ACCU-CHEK GUIDE TEST) test strip Use 1 test strip three times daily as needed to check glucose for type 2 diabetes E11.65 06/06/23   Liana Fish,  NP  ipratropium (ATROVENT ) 0.02 % nebulizer solution TAKE 2.5MLS BY NEBULIZATION FOUR TIMES DAILY WITH ALBUTEROL  09/16/22   Abernathy, Alyssa, NP  irbesartan  (AVAPRO ) 150 MG tablet Take 1 tablet by mouth once daily 03/28/23   Abernathy, Alyssa, NP  lamoTRIgine (LAMICTAL) 200 MG tablet Take 200 mg in the am, and 300 mg at night 06/25/22   [provider]  metoprolol  succinate (TOPROL -XL) 50 MG 24 hr tablet TAKE 1 & 1/2 (ONE & ONE-HALF) TABLETS BY MOUTH ONCE DAILY 03/21/23    Abernathy, Alyssa, NP  nortriptyline (PAMELOR) 10 MG capsule Take 10 mg by mouth. Take 1 tablet po  in the morning    Potter, Zachary E, MD  nortriptyline (PAMELOR) 50 MG capsule Take 50 mg by mouth at bedtime. Take 1 capsule by mouth at bedtime    [provider]  terbinafine  (LAMISIL ) 250 MG tablet Take 1 tablet (250 mg total) by mouth daily. 06/20/23 09/18/23  Silva Juliene SAUNDERS, DPM    Allergies as of 06/21/2023 - Review Complete 06/21/2023  Allergen Reaction Noted   Keppra  [levetiracetam ] Other (See Comments) 09/05/2019   Duloxetine   11/05/2019    Family History  Problem Relation Age of Onset   Diabetes Mother    Hypertension Mother    Cancer Mother    Congestive Heart Failure Father    Diabetes Father     Social History   Socioeconomic History   Marital status: Married    Spouse name: Not on file   Number of children: Not on file   Years of education: Not on file   Highest education level: Not on file  Occupational History   Not on file  Tobacco Use   Smoking status: Never   Smokeless tobacco: Never  Vaping Use   Vaping status: Never Used  Substance and Sexual Activity   Alcohol use: No   Drug use: No   Sexual activity: Yes  Other Topics Concern   Not on file  Social History Narrative   Not on file   Social Drivers of Health   Financial Resource Strain: Not on file  Food Insecurity: Not on file  Transportation Needs: Not on file  Physical Activity: Not on file  Stress: Not on file  Social Connections: Not on file  Intimate Partner Violence: Not on file    Review of Systems: See HPI, otherwise negative ROS  Physical Exam: LMP 05/28/2015  General:   Alert,  pleasant and cooperative in NAD Head:  Normocephalic and atraumatic. Neck:  Supple; no masses or thyromegaly. Lungs:  Clear throughout to auscultation, normal respiratory effort.    Heart:  +S1, +S2, Regular rate and rhythm, No edema. Abdomen:  Soft, nontender and nondistended. Normal  bowel sounds, without guarding, and without rebound.   Neurologic:  Alert and  oriented x4;  grossly normal neurologically.  Impression/Plan: [name] is here for an colonoscopy to be performed for a flexible sigmoidoscopy  Risks, benefits, limitations, and alternatives regarding  colonoscopy have been reviewed with the patient.  Questions have been answered.  All parties agreeable.   Ruel Kung, MD  07/07/2023, 9:45 AM

## 2023-07-07 NOTE — Op Note (Signed)
 [facility] Gastroenterology Patient Name: Kathlean Cinco Procedure Date: 07/07/2023 11:18 AM MRN: [id number] Account #: [id number] Date of Birth: [birth year] Admit Type: Outpatient Age: 53 Room: [facility] ENDO ROOM 3 Gender: Female Note Status: Finalized Instrument Name: Peds Colonoscope [id number] Procedure:             Flexible Sigmoidoscopy Indications:           Rectal hemorrhage Providers:             Ruel Kung MD, MD Referring MD:          Mardy Maxin (Referring MD) Medicines:             None, Monitored Anesthesia Care Complications:         No immediate complications. Procedure:             Pre-Anesthesia Assessment:                        - Prior to the procedure, a History and Physical was                         performed, and patient medications, allergies and                         sensitivities were reviewed. The patient's tolerance                         of previous anesthesia was reviewed.                        - The risks and benefits of the procedure and the                         sedation options and risks were discussed with the                         patient. All questions were answered and informed                         consent was obtained.                        - ASA Grade Assessment: II - A patient with mild                         systemic disease.                        After obtaining informed consent, the scope was passed                         under direct vision. The Colonoscope was introduced                         through the anus and advanced to the the left                         transverse colon. The flexible sigmoidoscopy was  accomplished with ease. The patient tolerated the                         procedure well. The quality of the bowel preparation                         was adequate. Findings:      The perianal and digital rectal examinations were normal.      Non-bleeding internal  hemorrhoids were found during retroflexion. The       hemorrhoids were medium-sized and Grade I (internal hemorrhoids that do       not prolapse).      The exam was otherwise without abnormality. Impression:            - Non-bleeding internal hemorrhoids.                        - The examination was otherwise normal.                        - No specimens collected. Recommendation:        - Discharge patient to home (with escort).                        - Resume previous diet.                        - Return to my office in 6 weeks.                        - for hemorroidal banding Procedure Code(s):     --- Professional ---                        [phone], Sigmoidoscopy, flexible; diagnostic, including                         collection of specimen(s) by brushing or washing, when                         performed (separate procedure) Diagnosis Code(s):     --- Professional ---                        K64.0, First degree hemorrhoids                        K62.5, Hemorrhage of anus and rectum CPT copyright 2022 American Medical Association. All rights reserved. The codes documented in this report are preliminary and upon coder review may  be revised to meet current compliance requirements. Ruel Kung, MD Ruel Kung MD, MD 07/07/2023 11:30:57 AM This report has been signed electronically. Number of Addenda: 0 Note Initiated On: 07/07/2023 11:18 AM Total Procedure Duration: 0 hours 6 minutes 38 seconds  Estimated Blood Loss:  Estimated blood loss: none.      [facility]

## 2023-07-07 NOTE — Transfer of Care (Signed)
 Immediate Anesthesia Transfer of Care Note  Patient: [name]  Procedure(s) Performed: FLEXIBLE SIGMOIDOSCOPY  Patient Location: Endoscopy Unit  Anesthesia Type:General  Level of Consciousness: drowsy  Airway & Oxygen Therapy: Patient Spontanous Breathing  Post-op Assessment: Report given to RN  Post vital signs: stable  Last Vitals:  Vitals Value Taken Time  BP    Temp    Pulse    Resp    SpO2      Last Pain:  Vitals:   07/07/23 1111  TempSrc: Temporal  PainSc: 0-No pain         Complications: No notable events documented.

## 2023-07-07 NOTE — Anesthesia Postprocedure Evaluation (Signed)
 Anesthesia Post Note  Patient: [name]  Procedure(s) Performed: FLEXIBLE SIGMOIDOSCOPY  Patient location during evaluation: PACU Anesthesia Type: General Level of consciousness: awake and alert, oriented and patient cooperative Pain management: pain level controlled Vital Signs Assessment: post-procedure vital signs reviewed and stable Respiratory status: spontaneous breathing, nonlabored ventilation and respiratory function stable Cardiovascular status: blood pressure returned to baseline and stable Postop Assessment: adequate PO intake Anesthetic complications: no   No notable events documented.   Last Vitals:  Vitals:   07/07/23 1143 07/07/23 1153  BP:    Pulse:    Resp: (!) 28   Temp:    SpO2:  98%    Last Pain:  Vitals:   07/07/23 1143  TempSrc:   PainSc: 0-No pain                 Alfonso Ruths

## 2023-07-07 NOTE — Anesthesia Preprocedure Evaluation (Addendum)
 Anesthesia Evaluation  Patient identified by MRN, date of birth, ID band Patient awake    Reviewed: Allergy & Precautions, NPO status , Patient's Chart, lab work & pertinent test results  History of Anesthesia Complications Negative for: history of anesthetic complications  Airway Mallampati: III   Neck ROM: Full    Dental  (+) Missing   Pulmonary asthma , sleep apnea    Pulmonary exam normal breath sounds clear to auscultation       Cardiovascular hypertension, Normal cardiovascular exam Rhythm:Regular Rate:Normal  ECG 03/17/23: NSR; no STEMI   Neuro/Psych  Headaches, Seizures - (none in past year),  Vertigo     GI/Hepatic ,GERD  ,,S/p gastric bypass   Endo/Other  diabetes, Type 2  Class 3 obesity  Renal/GU negative Renal ROS     Musculoskeletal   Abdominal   Peds  Hematology  (+) Blood dyscrasia, anemia   Anesthesia Other Findings Cardiology note 02/09/23:  1. Bilateral leg edema, improved with Lasix , continue Lasix  60 mg daily, low-salt diet advised. 2. HFpEF, EF 60 to 65%, impaired relaxation, etiology likely from morbid obesity.  Appears euvolemic, continue Lasix . 3. Morbid obesity, low-calorie diet, weight loss advised.  Start Wegovy . 4. Hypertension, BP controlled.  Continue irbesartan  150 mg daily, Toprol -XL 25 mg daily.   Follow-up in 3 months.   Reproductive/Obstetrics                             Anesthesia Physical Anesthesia Plan  ASA: 3  Anesthesia Plan: General   Post-op Pain Management:    Induction: Intravenous  PONV Risk Score and Plan: 3 and Propofol  infusion, TIVA and Treatment may vary due to age or medical condition  Airway Management Planned: Natural Airway  Additional Equipment:   Intra-op Plan:   Post-operative Plan:   Informed Consent: I have reviewed the patients History and Physical, chart, labs and discussed the procedure including the risks,  benefits and alternatives for the proposed anesthesia with the patient or authorized representative who has indicated his/her understanding and acceptance.       Plan Discussed with: CRNA  Anesthesia Plan Comments: (LMA/GETA backup discussed.  Patient consented for risks of anesthesia including but not limited to:  - adverse reactions to medications - damage to eyes, teeth, lips or other oral mucosa - nerve damage due to positioning  - sore throat or hoarseness - damage to heart, brain, nerves, lungs, other parts of body or loss of life  Informed patient about role of CRNA in peri- and intra-operative care.  Patient voiced understanding.)        Anesthesia Quick Evaluation

## 2023-07-08 ENCOUNTER — Encounter: Payer: Self-pay | Admitting: Gastroenterology

## 2023-07-15 ENCOUNTER — Other Ambulatory Visit: Payer: Self-pay

## 2023-07-15 ENCOUNTER — Encounter: Payer: Self-pay | Admitting: Orthopedic Surgery

## 2023-07-15 ENCOUNTER — Encounter
Admission: RE | Disposition: A | Discharge status: Home / Self Care | Payer: Self-pay | Source: Ambulatory Visit | Attending: Orthopedic Surgery

## 2023-07-15 ENCOUNTER — Ambulatory Visit: Payer: 59 | Admitting: Anesthesiology

## 2023-07-15 ENCOUNTER — Ambulatory Visit
Admission: RE | Admit: 2023-07-15 | Discharge: 2023-07-15 | Disposition: A | Discharge status: Home / Self Care | Payer: 59 | Source: Ambulatory Visit | Attending: Orthopedic Surgery | Admitting: Orthopedic Surgery

## 2023-07-15 DIAGNOSIS — J45909 Unspecified asthma, uncomplicated: Secondary | ICD-10-CM | POA: Insufficient documentation

## 2023-07-15 DIAGNOSIS — I340 Nonrheumatic mitral (valve) insufficiency: Secondary | ICD-10-CM | POA: Insufficient documentation

## 2023-07-15 DIAGNOSIS — M67432 Ganglion, left wrist: Secondary | ICD-10-CM | POA: Insufficient documentation

## 2023-07-15 DIAGNOSIS — Z6841 Body Mass Index (BMI) 40.0 and over, adult: Secondary | ICD-10-CM | POA: Diagnosis not present

## 2023-07-15 DIAGNOSIS — G5603 Carpal tunnel syndrome, bilateral upper limbs: Secondary | ICD-10-CM | POA: Diagnosis not present

## 2023-07-15 DIAGNOSIS — R569 Unspecified convulsions: Secondary | ICD-10-CM | POA: Diagnosis not present

## 2023-07-15 DIAGNOSIS — I10 Essential (primary) hypertension: Secondary | ICD-10-CM | POA: Diagnosis not present

## 2023-07-15 DIAGNOSIS — D649 Anemia, unspecified: Secondary | ICD-10-CM | POA: Diagnosis not present

## 2023-07-15 DIAGNOSIS — M654 Radial styloid tenosynovitis [de Quervain]: Secondary | ICD-10-CM | POA: Insufficient documentation

## 2023-07-15 DIAGNOSIS — R519 Headache, unspecified: Secondary | ICD-10-CM | POA: Diagnosis not present

## 2023-07-15 DIAGNOSIS — Z79899 Other long term (current) drug therapy: Secondary | ICD-10-CM | POA: Diagnosis not present

## 2023-07-15 DIAGNOSIS — G4733 Obstructive sleep apnea (adult) (pediatric): Secondary | ICD-10-CM | POA: Insufficient documentation

## 2023-07-15 DIAGNOSIS — K219 Gastro-esophageal reflux disease without esophagitis: Secondary | ICD-10-CM | POA: Insufficient documentation

## 2023-07-15 HISTORY — PX: CARPAL TUNNEL RELEASE: SHX101

## 2023-07-15 HISTORY — DX: Other ill-defined heart diseases: I51.89

## 2023-07-15 HISTORY — PX: DORSAL COMPARTMENT RELEASE: SHX5039

## 2023-07-15 HISTORY — PX: GANGLION CYST EXCISION: SHX1691

## 2023-07-15 HISTORY — DX: Nonrheumatic mitral (valve) insufficiency: I34.0

## 2023-07-15 SURGERY — CARPAL TUNNEL RELEASE
Anesthesia: General | Site: Wrist | Laterality: Left

## 2023-07-15 MED ORDER — METOCLOPRAMIDE HCL 5 MG PO TABS: 5.0000 mg | ORAL_TABLET | Freq: Three times a day (TID) | ORAL | Status: DC | PRN

## 2023-07-15 MED ORDER — SODIUM CHLORIDE 0.9 % IV SOLN: INTRAVENOUS | Status: DC

## 2023-07-15 MED ORDER — ACETAMINOPHEN 10 MG/ML IV SOLN
INTRAVENOUS | Status: DC | PRN
  Administered 2023-07-15: 1000 mg via INTRAVENOUS

## 2023-07-15 MED ORDER — PHENYLEPHRINE HCL (PRESSORS) 10 MG/ML IV SOLN
INTRAVENOUS | Status: DC | PRN
  Administered 2023-07-15: 100 ug via INTRAVENOUS
  Administered 2023-07-15: 200 ug via INTRAVENOUS
  Administered 2023-07-15: 150 ug via INTRAVENOUS
  Administered 2023-07-15 (×2): 200 ug via INTRAVENOUS
  Administered 2023-07-15: 150 ug via INTRAVENOUS

## 2023-07-15 MED ORDER — HYDROCODONE-ACETAMINOPHEN 5-325 MG PO TABS: 1.0000 | ORAL_TABLET | Freq: Four times a day (QID) | ORAL | 0 refills | Status: DC | PRN

## 2023-07-15 MED ORDER — ONDANSETRON HCL 4 MG/2ML IJ SOLN
INTRAMUSCULAR | Status: AC
  Filled 2023-07-15: qty 2

## 2023-07-15 MED ORDER — DEXAMETHASONE SODIUM PHOSPHATE 4 MG/ML IJ SOLN
INTRAMUSCULAR | Status: AC
  Filled 2023-07-15: qty 1

## 2023-07-15 MED ORDER — ONDANSETRON HCL 4 MG/2ML IJ SOLN: 4.0000 mg | Freq: Four times a day (QID) | INTRAMUSCULAR | Status: DC | PRN

## 2023-07-15 MED ORDER — 0.9 % SODIUM CHLORIDE (POUR BTL) OPTIME
TOPICAL | Status: DC | PRN
  Administered 2023-07-15: 500 mL

## 2023-07-15 MED ORDER — PROPOFOL 10 MG/ML IV BOLUS
INTRAVENOUS | Status: DC | PRN
  Administered 2023-07-15: 80 mg via INTRAVENOUS
  Administered 2023-07-15: 200 mg via INTRAVENOUS
  Administered 2023-07-15: 70 mg via INTRAVENOUS

## 2023-07-15 MED ORDER — ONDANSETRON HCL 4 MG/2ML IJ SOLN
INTRAMUSCULAR | Status: DC | PRN
  Administered 2023-07-15: 4 mg via INTRAVENOUS

## 2023-07-15 MED ORDER — LIDOCAINE HCL (PF) 2 % IJ SOLN
INTRAMUSCULAR | Status: AC
  Filled 2023-07-15: qty 5

## 2023-07-15 MED ORDER — MIDAZOLAM HCL 2 MG/2ML IJ SOLN
INTRAMUSCULAR | Status: AC
  Filled 2023-07-15: qty 2

## 2023-07-15 MED ORDER — DEXAMETHASONE SODIUM PHOSPHATE 4 MG/ML IJ SOLN
INTRAMUSCULAR | Status: DC | PRN
  Administered 2023-07-15: 8 mg via INTRAVENOUS

## 2023-07-15 MED ORDER — METOCLOPRAMIDE HCL 5 MG/ML IJ SOLN: 5.0000 mg | Freq: Three times a day (TID) | INTRAMUSCULAR | Status: DC | PRN

## 2023-07-15 MED ORDER — ONDANSETRON HCL 4 MG PO TABS
4.0000 mg | ORAL_TABLET | Freq: Four times a day (QID) | ORAL | Status: DC | PRN
Start: 2023-07-15 — End: 2023-07-15

## 2023-07-15 MED ORDER — FAMOTIDINE IN NACL 20-0.9 MG/50ML-% IV SOLN
INTRAVENOUS | Status: DC | PRN
Start: 2023-07-15 — End: 2023-07-15
  Administered 2023-07-15: 20 mg via INTRAVENOUS

## 2023-07-15 MED ORDER — ACETAMINOPHEN 325 MG PO TABS: 325.0000 mg | ORAL_TABLET | Freq: Four times a day (QID) | ORAL | Status: DC | PRN

## 2023-07-15 MED ORDER — PHENYLEPHRINE 80 MCG/ML (10ML) SYRINGE FOR IV PUSH (FOR BLOOD PRESSURE SUPPORT)
PREFILLED_SYRINGE | INTRAVENOUS | Status: AC
  Filled 2023-07-15: qty 10

## 2023-07-15 MED ORDER — PROPOFOL 10 MG/ML IV BOLUS
INTRAVENOUS | Status: AC
  Filled 2023-07-15: qty 20

## 2023-07-15 MED ORDER — MORPHINE SULFATE (PF) 2 MG/ML IV SOLN: 0.5000 mg | INTRAVENOUS | Status: DC | PRN

## 2023-07-15 MED ORDER — CEFAZOLIN SODIUM 1 G IJ SOLR
INTRAMUSCULAR | Status: AC
  Filled 2023-07-15: qty 10

## 2023-07-15 MED ORDER — BUPIVACAINE HCL 0.5 % IJ SOLN
INTRAMUSCULAR | Status: DC | PRN
  Administered 2023-07-15: 10 mL
  Administered 2023-07-15: 5 mL

## 2023-07-15 MED ORDER — LIDOCAINE HCL (CARDIAC) PF 100 MG/5ML IV SOSY
PREFILLED_SYRINGE | INTRAVENOUS | Status: DC | PRN
  Administered 2023-07-15: 100 mg via INTRATRACHEAL

## 2023-07-15 MED ORDER — EPHEDRINE SULFATE (PRESSORS) 50 MG/ML IJ SOLN
INTRAMUSCULAR | Status: DC | PRN
Start: 2023-07-15 — End: 2023-07-15
  Administered 2023-07-15: 5 mg via INTRAVENOUS
  Administered 2023-07-15: 7.5 mg via INTRAVENOUS
  Administered 2023-07-15: 5 mg via INTRAVENOUS

## 2023-07-15 MED ORDER — HYDROCODONE-ACETAMINOPHEN 5-325 MG PO TABS
ORAL_TABLET | ORAL | Status: AC
Start: 2023-07-15 — End: ?
  Filled 2023-07-15: qty 1

## 2023-07-15 MED ORDER — EPHEDRINE 5 MG/ML INJ
INTRAVENOUS | Status: AC
  Filled 2023-07-15: qty 5

## 2023-07-15 MED ORDER — FENTANYL CITRATE (PF) 100 MCG/2ML IJ SOLN
INTRAMUSCULAR | Status: AC
  Filled 2023-07-15: qty 2

## 2023-07-15 MED ORDER — FAMOTIDINE 200 MG/20ML IV SOLN
INTRAVENOUS | Status: AC
  Filled 2023-07-15: qty 1

## 2023-07-15 MED ORDER — HYDROCODONE-ACETAMINOPHEN 5-325 MG PO TABS
1.0000 | ORAL_TABLET | ORAL | Status: DC | PRN
  Administered 2023-07-15: 1 via ORAL

## 2023-07-15 MED ORDER — CEFAZOLIN SODIUM-DEXTROSE 3-4 GM/150ML-% IV SOLN
3.0000 g | INTRAVENOUS | Status: AC
  Administered 2023-07-15: 3 g via INTRAVENOUS

## 2023-07-15 MED ORDER — SEVOFLURANE IN SOLN
RESPIRATORY_TRACT | Status: AC
  Filled 2023-07-15: qty 250

## 2023-07-15 MED ORDER — FENTANYL CITRATE (PF) 100 MCG/2ML IJ SOLN
INTRAMUSCULAR | Status: DC | PRN
  Administered 2023-07-15 (×2): 25 ug via INTRAVENOUS

## 2023-07-15 MED ORDER — PROPOFOL 10 MG/ML IV BOLUS
INTRAVENOUS | Status: AC
Start: 2023-07-15 — End: ?
  Filled 2023-07-15: qty 20

## 2023-07-15 MED ORDER — CEFAZOLIN SODIUM-DEXTROSE 2-3 GM-%(50ML) IV SOLR
INTRAVENOUS | Status: AC
Start: 2023-07-15 — End: ?
  Filled 2023-07-15: qty 50

## 2023-07-15 MED ORDER — SODIUM CHLORIDE 0.9% FLUSH
3.0000 mL | Freq: Two times a day (BID) | INTRAVENOUS | Status: DC
Start: 2023-07-15 — End: 2023-07-15

## 2023-07-15 MED ORDER — MIDAZOLAM HCL 5 MG/5ML IJ SOLN
INTRAMUSCULAR | Status: DC | PRN
  Administered 2023-07-15: 2 mg via INTRAVENOUS

## 2023-07-15 MED ORDER — HYDROCODONE-ACETAMINOPHEN 7.5-325 MG PO TABS: 1.0000 | ORAL_TABLET | ORAL | Status: DC | PRN

## 2023-07-15 MED ORDER — ACETAMINOPHEN 10 MG/ML IV SOLN
INTRAVENOUS | Status: AC
  Filled 2023-07-15: qty 100

## 2023-07-15 MED ORDER — SODIUM CHLORIDE 0.9% FLUSH: 3.0000 mL | INTRAVENOUS | Status: DC | PRN

## 2023-07-15 SURGICAL SUPPLY — 18 items
BNDG ELASTIC 3X5.8 VLCR NS LF (GAUZE/BANDAGES/DRESSINGS) ×1 IMPLANT
BNDG ELASTIC 4X5.8 VLCR NS LF (GAUZE/BANDAGES/DRESSINGS) ×1 IMPLANT
CHLORAPREP W/TINT 26 (MISCELLANEOUS) ×1 IMPLANT
COVER LIGHT HANDLE UNIVERSAL (MISCELLANEOUS) ×2 IMPLANT
CUFF TOURN SGL QUICK 18X4 (TOURNIQUET CUFF) IMPLANT
GAUZE SPONGE 4X4 12PLY STRL (GAUZE/BANDAGES/DRESSINGS) ×1 IMPLANT
GAUZE XEROFORM 1X8 LF (GAUZE/BANDAGES/DRESSINGS) ×1 IMPLANT
GLOVE SURG SYN 9.0 PF PI (GLOVE) ×1 IMPLANT
GOWN STRL REIN 2XL XLG LVL4 (GOWN DISPOSABLE) ×2 IMPLANT
GOWN STRL REUS W/ TWL LRG LVL3 (GOWN DISPOSABLE) ×1 IMPLANT
KIT TURNOVER KIT A (KITS) ×1 IMPLANT
NS IRRIG 500ML POUR BTL (IV SOLUTION) ×1 IMPLANT
PACK EXTREMITY ARMC (MISCELLANEOUS) ×1 IMPLANT
PAD CAST 4YDX4 CTTN HI CHSV (CAST SUPPLIES) ×1 IMPLANT
PADDING CAST BLEND 3X4 STRL (MISCELLANEOUS) ×1 IMPLANT
SPLINT CAST 1 STEP 3X12 (MISCELLANEOUS) ×1 IMPLANT
SUT ETHILON 4-0 FS2 18XMFL BLK (SUTURE) ×1
SUTURE ETHLN 4-0 FS2 18XMF BLK (SUTURE) ×1 IMPLANT

## 2023-07-15 NOTE — Transfer of Care (Signed)
Immediate Anesthesia Transfer of Care Note  Patient: [name]  Procedure(s) Performed: CARPAL TUNNEL RELEASE (Left: Wrist) RELEASE DORSAL COMPARTMENT (DEQUERVAIN) (Left: Wrist) REMOVAL GANGLION OF WRIST (Left: Wrist)  Patient Location: PACU  Anesthesia Type: General  Level of Consciousness: awake, alert  and patient cooperative  Airway and Oxygen Therapy: Patient Spontanous Breathing and Patient connected to supplemental oxygen  Post-op Assessment: Post-op Vital signs reviewed, Patient's Cardiovascular Status Stable, Respiratory Function Stable, Patent Airway and No signs of Nausea or vomiting  Post-op Vital Signs: Reviewed and stable  Complications: No notable events documented.

## 2023-07-15 NOTE — Discharge Instructions (Signed)
Keep arm elevated is much as possible Work on finger range of motion Pain medicine as directed Call office if you are having problems Keep dressing clean and dry

## 2023-07-15 NOTE — Anesthesia Preprocedure Evaluation (Addendum)
Anesthesia Evaluation  Patient identified by MRN, date of birth, ID band Patient awake    Reviewed: Allergy & Precautions, H&P , NPO status , Patient's Chart, lab work & pertinent test results  History of Anesthesia Complications (+) history of anesthetic complications  Airway Mallampati: III  TM Distance: >3 FB Neck ROM: Full    Dental no notable dental hx.    Pulmonary neg pulmonary ROS, asthma , sleep apnea    Pulmonary exam normal breath sounds clear to auscultation       Cardiovascular hypertension, Normal cardiovascular exam Rhythm:Regular Rate:Normal  05-23-22 1. Left ventricular ejection fraction, by estimation, is 60 to 65%. The  left ventricle has normal function. The left ventricle has no regional  wall motion abnormalities. Left ventricular diastolic parameters are  consistent with Grade I diastolic  dysfunction (impaired relaxation).   2. Right ventricular systolic function is normal. The right ventricular  size is normal. Tricuspid regurgitation signal is inadequate for assessing  PA pressure.   3. The mitral valve is normal in structure. Mild mitral valve  regurgitation. No evidence of mitral stenosis.   4. The aortic valve is normal in structure. Aortic valve regurgitation is  not visualized. No aortic stenosis is present.   5. The inferior vena cava is normal in size with greater than 50%  respiratory variability, suggesting right atrial pressure of 3 mmHg.   FINDINGS   Left Ventricle: Left ventricular ejection fraction, by estimation, is 60  to 65%. The left ventricle has normal function. The left ventricle has no  regional wall motion abnormalities. The left ventricular internal cavity  size was normal in size. There is   no left ventricular hypertrophy. Left ventricular diastolic parameters  are consistent with Grade I diastolic dysfunction (impaired relaxation).   Right Ventricle: The right ventricular  size is normal. No increase in  right ventricular wall thickness. Right ventricular systolic function is  normal. Tricuspid regurgitation signal is inadequate for assessing PA  pressure.     Neuro/Psych  Headaches, Seizures -,  PSYCHIATRIC DISORDERS      negative neurological ROS  negative psych ROS   GI/Hepatic negative GI ROS, Neg liver ROS,GERD  ,,  Endo/Other  negative endocrine ROSdiabetes    Renal/GU negative Renal ROS  negative genitourinary   Musculoskeletal negative musculoskeletal ROS (+)    Abdominal   Peds negative pediatric ROS (+)  Hematology negative hematology ROS (+) Blood dyscrasia, anemia   Anesthesia Other Findings Asthma  Anemia Hypertension  Complication of anesthesia-had asthma attack under anesthesia Allergy  Bronchitis, acute Gastroesophageal reflux disease without esophagitis  Seizure (HCC) Sleep apnea  Vertigo Morbid obesity with body mass index (BMI) of 45.0 to 49.9 in adult [facility]) Obstructive sleep apnea on CPAP Mild mitral regurg Grade I diastolic dysfunction  Reproductive/Obstetrics negative OB ROS                             Anesthesia Physical Anesthesia Plan  ASA: 3  Anesthesia Plan: General   Post-op Pain Management:    Induction: Intravenous  PONV Risk Score and Plan:   Airway Management Planned: Natural Airway and Nasal Cannula  Additional Equipment:   Intra-op Plan:   Post-operative Plan: Extubation in OR  Informed Consent: I have reviewed the patients History and Physical, chart, labs and discussed the procedure including the risks, benefits and alternatives for the proposed anesthesia with the patient or authorized representative who has indicated his/her understanding and  acceptance.     Dental Advisory Given  Plan Discussed with: Anesthesiologist, CRNA and Surgeon  Anesthesia Plan Comments: (Patient consented for risks of anesthesia including but not limited to:  - adverse  reactions to medications - risk of airway placement if required - damage to eyes, teeth, lips or other oral mucosa - nerve damage due to positioning  - sore throat or hoarseness - Damage to heart, brain, nerves, lungs, other parts of body or loss of life  Patient voiced understanding and assent.)        Anesthesia Quick Evaluation

## 2023-07-15 NOTE — H&P (Signed)
Chief Complaint Patient presents with Left Wrist - Follow-up   History of the Present Illness: [name] is a 53 y.o. female here today for left-hand-dominant female who returns to discuss possible left hand surgery. She has bilateral carpal tunnel syndrome, left thumb stenosing tenosynovitis, and de Quervain's tenosynovitis, all of which have not responded to nonoperative measures. Previously, there was a discussion about potentially releasing the de Quervain's and trigger finger concurrently, but not all three conditions simultaneously. She is here today to revisit this discussion.  Today, the patient reports that the condition of her left hand is deteriorating, although the stenosing tenosynovitis in her left thumb has resolved. She has been wearing a brace. However, a ganglion cyst is present on her left hand that causes discomfort, particularly during left hand extension. An ultrasound-guided injection was previously administered to the ganglion cyst by Cranston Neighbor, PA-C. The patient has also received bilateral carpal tunnel injections, and an injection for de Quervain's. She has previously been evaluated with an EMG.  The patient is also interested in undergoing surgery for her right hand.  She is status post a previous total knee arthroplasty.  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 She is currently on FMLA.  I have reviewed past medical, surgical, social and family history, and allergies as documented in the EMR.  Past Medical History: Past Medical History: Diagnosis Date Asthma (HHS-HCC) Carpal tunnel syndrome GERD (gastroesophageal reflux disease) History of chickenpox History of iron deficiency anemia HTN (hypertension) Obesity Osteoarthritis Sleep apnea Vitamin D deficiency  Past Surgical History: Past Surgical History: Procedure Laterality Date JOINT REPLACEMENT Right 12/19/2012 Right  total knee replacement. EGD 11/13/2013 Bariatric Clearance for Dr. Primus Bravo - Mild Gastritis - Cleared per Dr. Shelle Iron LAPAROSCOPIC GASTRIC BYPASS 11/2013 TAH with b/l salpingectomy 06/09/2015 CATARACT EXTRACTION W/ INTRAOCULAR LENS IMPLANT & ANTERIOR VITRECTOMY, BILATERAL CESAREAN SECTION CHOLECYSTECTOMY Hernia repair 2009 Left finger surgery Right knee manipulation 04/17/13  Past Family History: Family History Problem Relation Age of Onset High blood pressure (Hypertension) Mother Diabetes type II Mother Cervical cancer Mother Diabetes type II Father Cancer Brother  Medications: Current Outpatient Medications Medication Sig Dispense Refill albuterol 90 mcg/actuation inhaler Inhale 2 inhalations into the lungs every 6 (six) hours as needed for Wheezing. clonazePAM (KLONOPIN) 0.5 MG tablet Take 0.5 tablets (0.25 mg total) by mouth 2 (two) times daily 30 tablet 0 FUROsemide (LASIX) 40 MG tablet Take 60 mg by mouth once daily gabapentin (NEURONTIN) 300 MG capsule Take 3 capsules (900 mg total) by mouth at bedtime 90 capsule 1 gabapentin (NEURONTIN) 300 MG capsule Take 3 capsules (900 mg total) by mouth at bedtime 270 capsule 0 irbesartan (AVAPRO) 150 MG tablet Take 150 mg by mouth once daily lamoTRIgine (LAMICTAL) 200 MG tablet TAKE 1 TABLET BY MOUTH IN THE MORNING AND 1 & 1/2 (ONE & ONE-HALF) AT NIGHT 225 tablet 0 LORazepam (ATIVAN) 0.5 MG tablet Can take 1 tab once a day as needed for seizure. Take if you have seizure aura. 10 tablet 0 metoprolol succinate (TOPROL-XL) 50 MG XL tablet Take 75 mg by mouth once daily nortriptyline (PAMELOR) 10 MG capsule TAKE 2 CAPSULES BY MOUTH IN THE MORNING 60 capsule 0 nortriptyline (PAMELOR) 50 MG capsule Take 1 capsule by mouth at bedtime 30 capsule 0 prednisoLONE acetate (PRED FORTE) 1 % ophthalmic suspension Place 2 drops into both eyes 2 (two) times daily terBINafine HCL (LAMISIL) 250 mg tablet Take 250 mcg by mouth every  morning before  breakfast (0630) nortriptyline (PAMELOR) 50 MG capsule Take 1 capsule (50 mg total) by mouth at bedtime for 120 days 30 capsule 3  No current facility-administered medications for this visit.  Allergies: Allergies Allergen Reactions Levetiracetam Other (See Comments) hallucinations Duloxetine Unknown She had episode  Other Reaction(s): Unknown  She had episode   Body mass index is 47.37 kg/m.  Review of Systems: A comprehensive 14 point ROS was performed, reviewed, and the pertinent orthopaedic findings are documented in the HPI.  Vitals: 06/27/23 0947 BP: 114/78   General Physical Examination:  Lungs are clear. Heart rate and rhythm is normal. HEENT is normal. Teeth are in good condition.  Musculoskeletal Examination: Positive Tinel sign is positive on the left. Phalen test is positive at 8 seconds in the left hand. A small, but tender ganglion cyst is noted over the left volar wrist.  Radiographs:  No new imaging studies were obtained or reviewed today.  Assessment: ICD-10-CM 1. De Quervain's tenosynovitis left M65.4 2. Bilateral carpal tunnel syndrome G56.03 3. Ganglion, left wrist [id number]  Plan:  The patient has clinical findings of multiple left hand problems, including severe Suzette Battiest with a knot at the radial styloid with multiple prior injections, carpal tunnel syndrome, and a ganglion cyst of the left volar wrist. The left thumb stenosing tenosynovitis has resolved and does not require treatment.  The plan is for ganglion cyst excision, carpal tunnel release, and de Quervain's release on the left hand. Surgery will be scheduled at the patient's convenience. I anticipate that she will be able to return to work 2 days following surgery. Postoperatively, the patient will wear a brace for 1 month to prevent recurrence of the ganglion cyst. Once she has recovered, we can address her right hand carpal tunnel syndrome.  Surgical Risks:  The nature of the  condition and the proposed procedure has been reviewed in detail with the patient. Surgical versus non-surgical options and prognosis for recovery have been reviewed and the inherent risks and benefits of each have been discussed including the risks of infection, bleeding, injury to nerves/blood vessels/tendons, incomplete relief of symptoms, persisting pain and/or stiffness, loss of function, complex regional pain syndrome, failure of the procedure, as appropriate.  Teeth: Good condition.  Document Attestation: Leeroy Bock, have reviewed and updated documentation for [facility], MD, utilizing [employer] DAX.  Electronically signed by Marlena Clipper, MD at 06/27/2023 12:53 PM EST   Reviewed  H+P. No changes noted.

## 2023-07-15 NOTE — Op Note (Signed)
07/15/2023  2:43 PM  PATIENT:  [name]  53 y.o. female  PRE-OPERATIVE DIAGNOSIS:  De Quervain's tenosynovitis M65.4 Bilateral carpal tunnel syndrome G56.03 Ganglion, left wrist M67.432  POST-OPERATIVE DIAGNOSIS:  De Quervain's tenosynovitis  Bilateral carpal tunnel syndrome  Ganglion, left wrist  PROCEDURE:  Procedure(s): CARPAL TUNNEL RELEASE (Left) RELEASE DORSAL COMPARTMENT (DEQUERVAIN) (Left) REMOVAL GANGLION OF WRIST (Left)  SURGEON: Leitha Schuller, MD  ASSISTANTS: None  ANESTHESIA:   general  EBL:  Total I/O In: 650 [I.V.:400; IV Piggyback:250] Out: 2 [Blood:2]  BLOOD ADMINISTERED:none  DRAINS: none   LOCAL MEDICATIONS USED:  MARCAINE    and Amount: 15 ml  SPECIMEN:  No Specimen  DISPOSITION OF SPECIMEN:  N/A  COUNTS:  YES  TOURNIQUET:   Total Tourniquet Time Documented: Forearm (Left) - 22 minutes Total: Forearm (Left) - 22 minutes   IMPLANTS: None  DICTATION: .Dragon Dictation patient was brought to the operating room and anesthesia was obtained by general anesthesia.  The arm was then prepped and draped in the usual sterile fashion with a tourniquet applied the upper arm and appropriate patient identification and timeout procedures were completed.  Tourniquet was raised and initial part of procedure was the de Quervain's release with a transverse incision made over the radial styloid was a very thick band compressing the tendons this was opened and care taken to preserve vessel veins and adjacent superficial nerve branches released scar proximally and distally until the tendons were completely free.  Tendons were visibly compressed but intact.  Next going to the carpal tunnel incision 2 cm incision was made in line with the ring metacarpal with the incision down through the skin and subcutaneous tissue transverse carpal ligament identified.  Thenar musculature was overlying this this was elevated a small incision made and a vascular hemostat placed  deep to protect the underlying structure with release carried out proximally and distally there is compression in the mid carpal tunnel and good vascular blush after release.  10 cc of half percent Sensorcaine were infiltrated to the area of the 2 incisions and the wounds closed with simple erupted 4-0 nylon skin sutures the ganglion is then approached through a volar approach radial artery identified and the ganglion was adjacent to it with protecting the radial artery ganglion was opened and the base abraded with use of a Freer to try to prevent recurrence.  The wound was then closed using simple erupted 4-0 nylon after letting the tourniquet down and making certain that there is no arterial injury and there was not.  The wounds were then dressed with Xeroform 4 x 4 web roll and a volar splint to immobilize for the ganglion.  PLAN OF CARE: Discharge to home after PACU  PATIENT DISPOSITION:  PACU - hemodynamically stable.

## 2023-07-15 NOTE — Anesthesia Postprocedure Evaluation (Signed)
Anesthesia Post Note  Patient: [name]  Procedure(s) Performed: CARPAL TUNNEL RELEASE (Left: Wrist) RELEASE DORSAL COMPARTMENT (DEQUERVAIN) (Left: Wrist) REMOVAL GANGLION OF WRIST (Left: Wrist)  Patient location during evaluation: PACU Anesthesia Type: General Level of consciousness: awake and alert Pain management: pain level controlled Vital Signs Assessment: post-procedure vital signs reviewed and stable Respiratory status: spontaneous breathing, nonlabored ventilation, respiratory function stable and patient connected to nasal cannula oxygen Cardiovascular status: blood pressure returned to baseline and stable Postop Assessment: no apparent nausea or vomiting Anesthetic complications: no   No notable events documented.   Last Vitals:  Vitals:   07/15/23 1500 07/15/23 1505  BP: 112/72   Pulse: 79 77  Resp: 11 15  Temp: 36.7 C   SpO2: 95% 94%    Last Pain:  Vitals:   07/15/23 1510  TempSrc:   PainSc: 4                  Real Cona C Elna Radovich

## 2023-07-15 NOTE — Anesthesia Procedure Notes (Addendum)
Procedure Name: LMA Insertion Date/Time: 07/15/2023 2:01 PM  Performed by: Steffan Caniglia, [place], CRNAPre-anesthesia Checklist: Patient identified, Patient being monitored, Timeout performed, Emergency Drugs available and Suction available Patient Re-evaluated:Patient Re-evaluated prior to induction Oxygen Delivery Method: Circle system utilized Preoxygenation: Pre-oxygenation with 100% oxygen Induction Type: IV induction Ventilation: Mask ventilation without difficulty LMA: LMA inserted LMA Size: 5.0 Tube type: Oral Number of attempts: 1 Placement Confirmation: positive ETCO2 and breath sounds checked- equal and bilateral Tube secured with: Tape Dental Injury: Teeth and Oropharynx as per pre-operative assessment  Comments: 1st attempt with LMA 4 with good seal however very low TV with APL valve at 15cmH20, switched to LMA 5.0 with improved tidal volumes

## 2023-07-16 ENCOUNTER — Encounter: Payer: Self-pay | Admitting: Orthopedic Surgery

## 2023-07-18 ENCOUNTER — Ambulatory Visit (INDEPENDENT_AMBULATORY_CARE_PROVIDER_SITE_OTHER): Payer: 59 | Admitting: Nurse Practitioner

## 2023-07-18 ENCOUNTER — Encounter: Payer: Self-pay | Admitting: Nurse Practitioner

## 2023-07-18 VITALS — BP 126/88 | HR 98 | Temp 98.8°F | Resp 16 | Ht 62.0 in | Wt 276.8 lb

## 2023-07-18 DIAGNOSIS — E785 Hyperlipidemia, unspecified: Secondary | ICD-10-CM

## 2023-07-18 DIAGNOSIS — E1159 Type 2 diabetes mellitus with other circulatory complications: Secondary | ICD-10-CM

## 2023-07-18 DIAGNOSIS — J0100 Acute maxillary sinusitis, unspecified: Secondary | ICD-10-CM | POA: Diagnosis not present

## 2023-07-18 DIAGNOSIS — E1169 Type 2 diabetes mellitus with other specified complication: Secondary | ICD-10-CM

## 2023-07-18 DIAGNOSIS — B379 Candidiasis, unspecified: Secondary | ICD-10-CM

## 2023-07-18 DIAGNOSIS — I152 Hypertension secondary to endocrine disorders: Secondary | ICD-10-CM

## 2023-07-18 DIAGNOSIS — K644 Residual hemorrhoidal skin tags: Secondary | ICD-10-CM

## 2023-07-18 MED ORDER — FLUCONAZOLE 150 MG PO TABS: 150.0000 mg | ORAL_TABLET | Freq: Once | ORAL | 0 refills | Status: AC

## 2023-07-18 MED ORDER — ALBUTEROL SULFATE HFA 108 (90 BASE) MCG/ACT IN AERS: 1.0000 | INHALATION_SPRAY | Freq: Four times a day (QID) | RESPIRATORY_TRACT | 5 refills | Status: DC | PRN

## 2023-07-18 MED ORDER — AMOXICILLIN-POT CLAVULANATE 875-125 MG PO TABS: 1.0000 | ORAL_TABLET | Freq: Two times a day (BID) | ORAL | 0 refills | Status: AC

## 2023-07-18 NOTE — Progress Notes (Signed)
 [facility] [address] [place], [place] [address]  Internal MEDICINE  Office Visit Note  Patient Name: [name]  [id number]  [id number]  Date of Service: 07/18/2023  Chief Complaint  Patient presents with   Gastroesophageal Reflux   Hypertension   Follow-up    HPI [name] presents for a follow-up visit for hemorrhoids, cough and sinus issues, diabetes, hypertension, and high cholesterol. Has grade 1 internal hemorrhoids, having hemorrhoid banding done in march Having a cough, this started right before her hand/wrist surgery and continues to go on now. Coughing up yellow sputum, sore throat, hoarse, runny nose, headache, fatigue, wheezing, chest tightness, and some SOB.  Denies any fever, chills, body aches, ear pain Diabetes -- has been working on her diet and physical activity.  High cholesterol -- not currently on any medication for cholesterol. Hypertension -- taking irbesartan, metoprolol succinate and torsemide.     Current Medication: Outpatient Encounter Medications as of 07/18/2023  Medication Sig   Accu-Chek Softclix Lancets lancets Use 1 lancet to check glucose three times daily as needed for diabetes   albuterol (PROVENTIL) (2.5 MG/3ML) 0.083% nebulizer solution USE 1 VIAL IN NEBULIZER EVERY 4 HOURS AS NEEDED FOR WHEEZING AND FOR SHORTNESS OF BREATH (MIX WITH IPRATROPIUM)   clonazePAM (KLONOPIN) 0.5 MG tablet Take 0.5 mg by mouth 2 (two) times daily as needed for anxiety. Take 1/2 tablet by mouth twice a day   clotrimazole-betamethasone (LOTRISONE) cream Apply 1 Application topically daily. To affected areas of right foot until resolved.   furosemide (LASIX) 40 MG tablet TAKE 1 & 1/2 (ONE & ONE-HALF) TABLETS BY MOUTH ONCE DAILY   gabapentin (NEURONTIN) 300 MG capsule Take 300 mg by mouth daily. Take 1 to 2 capsules by mouth nightly for sleep   glucose blood (ACCU-CHEK GUIDE TEST) test strip Use 1 test strip three times daily as needed to check  glucose for type 2 diabetes E11.65   HYDROcodone-acetaminophen (NORCO/VICODIN) 5-325 MG tablet Take 1-2 tablets by mouth every 6 (six) hours as needed for moderate pain (pain score 4-6).   ipratropium (ATROVENT) 0.02 % nebulizer solution TAKE 2.5MLS BY NEBULIZATION FOUR TIMES DAILY WITH ALBUTEROL   irbesartan (AVAPRO) 150 MG tablet Take 1 tablet by mouth once daily   lamoTRIgine (LAMICTAL) 200 MG tablet Take 200 mg in the am, and 300 mg at night   metoprolol succinate (TOPROL-XL) 50 MG 24 hr tablet TAKE 1 & 1/2 (ONE & ONE-HALF) TABLETS BY MOUTH ONCE DAILY   nortriptyline (PAMELOR) 10 MG capsule Take 10 mg by mouth. Take 1 tablet po  in the morning   nortriptyline (PAMELOR) 50 MG capsule Take 50 mg by mouth at bedtime. Take 1 capsule by mouth at bedtime   terbinafine (LAMISIL) 250 MG tablet Take 1 tablet (250 mg total) by mouth daily.   [DISCONTINUED] albuterol (PROVENTIL HFA;VENTOLIN HFA) 108 (90 BASE) MCG/ACT inhaler Inhale 2 puffs into the lungs every 6 (six) hours as needed for wheezing or shortness of breath.   albuterol (VENTOLIN HFA) 108 (90 Base) MCG/ACT inhaler Inhale 1-2 puffs into the lungs every 6 (six) hours as needed for wheezing or shortness of breath.   amoxicillin-clavulanate (AUGMENTIN) 875-125 MG tablet Take 1 tablet by mouth 2 (two) times daily for 10 days. Take with food   fluconazole (DIFLUCAN) 150 MG tablet Take 1 tablet (150 mg total) by mouth once for 1 dose. May take an additional dose after 3 days if still symptomatic.   No facility-administered encounter medications on  file as of 07/18/2023.    Surgical History: Past Surgical History:  Procedure Laterality Date   ABDOMINAL HYSTERECTOMY N/A 06/09/2015   Procedure: HYSTERECTOMY ABDOMINAL with Bilateral Salpingectomy;  Surgeon: Suzy Bouchard, MD;  Location: [facility];  Service: Gynecology;  Laterality: N/A;   CARPAL TUNNEL RELEASE Left 07/15/2023   Procedure: CARPAL TUNNEL RELEASE;  Surgeon: Kennedy Bucker, MD;   Location: [facility];  Service: Orthopedics;  Laterality: Left;   CATARACT EXTRACTION W/PHACO Right 05/17/2023   Procedure: CATARACT EXTRACTION PHACO AND INTRAOCULAR LENS PLACEMENT (IOC) RIGHT MALYUGIN OMIDRIA 9.07 00:47.9;  Surgeon: Estanislado Pandy, MD;  Location: [facility];  Service: Ophthalmology;  Laterality: Right;   CATARACT EXTRACTION W/PHACO Left 05/30/2023   Procedure: CATARACT EXTRACTION PHACO AND INTRAOCULAR LENS PLACEMENT (IOC) LEFT [name] 3.73 00:32.0;  Surgeon: Estanislado Pandy, MD;  Location: [facility];  Service: Ophthalmology;  Laterality: Left;   CESAREAN SECTION  [id number]   COLONOSCOPY WITH PROPOFOL N/A 10/24/2020   Procedure: COLONOSCOPY WITH PROPOFOL;  Surgeon: Midge Minium, MD;  Location: [facility];  Service: Endoscopy;  Laterality: N/A;  sleep apnea   DORSAL COMPARTMENT RELEASE Left 07/15/2023   Procedure: RELEASE DORSAL COMPARTMENT (DEQUERVAIN);  Surgeon: Kennedy Bucker, MD;  Location: [facility];  Service: Orthopedics;  Laterality: Left;   FINGER SURGERY  1992   FLEXIBLE SIGMOIDOSCOPY N/A 07/07/2023   Procedure: FLEXIBLE SIGMOIDOSCOPY;  Surgeon: Wyline Mood, MD;  Location: [facility];  Service: Gastroenterology;  Laterality: N/A;  Unsedated per office; patient states she might want sedation   GANGLION CYST EXCISION Left 07/15/2023   Procedure: REMOVAL GANGLION OF WRIST;  Surgeon: Kennedy Bucker, MD;  Location: [facility];  Service: Orthopedics;  Laterality: Left;   GASTRIC BYPASS     INGUINAL HERNIA REPAIR Right 2010   REPLACEMENT TOTAL KNEE Right     Medical History: Past Medical History:  Diagnosis Date   Allergy    environmental   Anemia    Asthma    Bronchitis, acute    Complication of anesthesia    HAD ASTHMA ATTACK WHILE UNDER   Gastroesophageal reflux disease without esophagitis 11/02/2017   Grade I diastolic dysfunction    Hypertension    Mild mitral regurgitation by prior  echocardiogram    Morbid obesity with body mass index (BMI) of 45.0 to 49.9 in adult [facility])    Obstructive sleep apnea on CPAP    Seizure (HCC)    None since 01/2023   Sleep apnea    CPAP   Vertigo    10/24    Family History: Family History  Problem Relation Age of Onset   Diabetes Mother    Hypertension Mother    Cancer Mother    Congestive Heart Failure Father    Diabetes Father     Social History   Socioeconomic History   Marital status: Married    Spouse name: Not on file   Number of children: Not on file   Years of education: Not on file   Highest education level: Not on file  Occupational History   Not on file  Tobacco Use   Smoking status: Never   Smokeless tobacco: Never  Vaping Use   Vaping status: Never Used  Substance and Sexual Activity   Alcohol use: No   Drug use: No   Sexual activity: Yes  Other Topics Concern   Not on file  Social History Narrative   Not on file   Social Drivers of Health  Financial Resource Strain: Not on file  Food Insecurity: Not on file  Transportation Needs: Not on file  Physical Activity: Not on file  Stress: Not on file  Social Connections: Not on file  Intimate Partner Violence: Not on file      Review of Systems  Constitutional:  Positive for fatigue. Negative for appetite change, chills, fever and unexpected weight change.  HENT:  Positive for congestion, ear pain, postnasal drip, rhinorrhea, sinus pressure, sinus pain and sore throat. Negative for sneezing.   Respiratory:  Positive for cough, chest tightness and shortness of breath. Negative for wheezing.   Cardiovascular: Negative.  Negative for chest pain and palpitations.  Gastrointestinal:  Positive for anal bleeding (due to hemorrhoids intermittent) and rectal pain (due to hemorrhoids).  Musculoskeletal:  Positive for arthralgias and back pain. Negative for myalgias.    Vital Signs: BP 126/88   Pulse 98   Temp 98.8 F (37.1 C)   Resp 16   Ht 5\' 2"   (1.575 m)   Wt 276 lb 12.8 oz (125.6 kg)   LMP 05/28/2015   SpO2 98%   BMI 50.63 kg/m    Physical Exam Vitals reviewed.  Constitutional:      General: She is not in acute distress.    Appearance: Normal appearance. She is obese. She is not ill-appearing.  HENT:     Head: Normocephalic and atraumatic.     Right Ear: Tympanic membrane, ear canal and external ear normal.     Left Ear: Tympanic membrane, ear canal and external ear normal.     Nose: Mucosal edema, congestion and rhinorrhea present.     Right Turbinates: Swollen and pale.     Left Turbinates: Swollen and pale.     Right Sinus: Maxillary sinus tenderness present. No frontal sinus tenderness.     Left Sinus: Maxillary sinus tenderness present. No frontal sinus tenderness.     Mouth/Throat:     Mouth: Mucous membranes are moist.     Pharynx: Posterior oropharyngeal erythema present.  Eyes:     Extraocular Movements: Extraocular movements intact.     Conjunctiva/sclera: Conjunctivae normal.     Pupils: Pupils are equal, round, and reactive to light.  Cardiovascular:     Rate and Rhythm: Normal rate and regular rhythm.     Heart sounds: Normal heart sounds. No murmur heard. Pulmonary:     Effort: Pulmonary effort is normal. No accessory muscle usage or respiratory distress.     Breath sounds: Normal air entry. Examination of the right-upper field reveals wheezing. Examination of the left-upper field reveals wheezing. Wheezing present.  Lymphadenopathy:     Cervical: Cervical adenopathy present.  Skin:    General: Skin is warm and dry.  Neurological:     Mental Status: She is alert and oriented to person, place, and time.  Psychiatric:        Mood and Affect: Mood normal.        Behavior: Behavior normal.        Assessment/Plan: 1. Acute non-recurrent maxillary sinusitis (Primary) Augmentin prescribed, take until gone. Albuterol inhaler prescribed as needed for SOB - amoxicillin-clavulanate (AUGMENTIN)  875-125 MG tablet; Take 1 tablet by mouth 2 (two) times daily for 10 days. Take with food  Dispense: 20 tablet; Refill: 0 - albuterol (VENTOLIN HFA) 108 (90 Base) MCG/ACT inhaler; Inhale 1-2 puffs into the lungs every 6 (six) hours as needed for wheezing or shortness of breath.  Dispense: 8 g; Refill: 5  2. Type 2  diabetes mellitus with other specified complication, without long-term current use of insulin (HCC) Continue diet modification and increasing physical activity as tolerated. Has A1c repeated in a few weeks.  - Hgb A1C w/o eAG  3. Hypertension associated with type 2 diabetes mellitus (HCC) Stable, continue metoprolol, irbesartan and torsemide as prescribed.   4. Hyperlipidemia associated with type 2 diabetes mellitus (HCC) Repeat lipid panel, consider adding statin therapy if tolerated.  - Lipid Profile  5. External hemorrhoid Discussed OTC interventions to help alleviate itching and rectal pain.   6. Antibiotic-induced yeast infection Fluconazole prescribed just in case.  - fluconazole (DIFLUCAN) 150 MG tablet; Take 1 tablet (150 mg total) by mouth once for 1 dose. May take an additional dose after 3 days if still symptomatic.  Dispense: 3 tablet; Refill: 0   General Counseling: Olive verbalizes understanding of the findings of todays visit and agrees with plan of treatment. I have discussed any further diagnostic evaluation that may be needed or ordered today. We also reviewed her medications today. she has been encouraged to call the office with any questions or concerns that should arise related to todays visit.    Orders Placed This Encounter  Procedures   Lipid Profile   Hgb A1C w/o eAG    Meds ordered this encounter  Medications   amoxicillin-clavulanate (AUGMENTIN) 875-125 MG tablet    Sig: Take 1 tablet by mouth 2 (two) times daily for 10 days. Take with food    Dispense:  20 tablet    Refill:  0    Fill new script today   fluconazole (DIFLUCAN) 150 MG  tablet    Sig: Take 1 tablet (150 mg total) by mouth once for 1 dose. May take an additional dose after 3 days if still symptomatic.    Dispense:  3 tablet    Refill:  0    Fill new script today   albuterol (VENTOLIN HFA) 108 (90 Base) MCG/ACT inhaler    Sig: Inhale 1-2 puffs into the lungs every 6 (six) hours as needed for wheezing or shortness of breath.    Dispense:  8 g    Refill:  5    Return in about 1 month (around 08/15/2023) for F/U, Labs, Batoul Limes PCP diabetic medications.   Total time spent:30 Minutes Time spent includes review of chart, medications, test results, and follow up plan with the patient.   [place] Controlled Substance Database was reviewed by me.  This patient was seen by Sallyanne Kuster, FNP-C in collaboration with Dr. Beverely Risen as a part of collaborative care agreement.   Fatmata Legere R. Tedd Sias, MSN, FNP-C Internal medicine

## 2023-07-21 ENCOUNTER — Telehealth: Payer: Self-pay | Admitting: Nurse Practitioner

## 2023-07-21 NOTE — Telephone Encounter (Signed)
Spoke with pt as per alyssa to take it

## 2023-07-24 ENCOUNTER — Other Ambulatory Visit: Payer: Self-pay | Admitting: Nurse Practitioner

## 2023-07-24 DIAGNOSIS — R000 Tachycardia, unspecified: Secondary | ICD-10-CM

## 2023-08-02 ENCOUNTER — Ambulatory Visit: Payer: 59 | Admitting: Gastroenterology

## 2023-08-02 NOTE — Progress Notes (Deleted)
 Wyline Mood MD, MRCP(U.K) [address]  [address]  [place], [place] [address]  Main: [phone]  Fax: [phone]   Primary Care Physician: Sallyanne Kuster, NP  Primary Gastroenterologist:  Dr. Wyline Mood   No chief complaint on file.   HPI: [name] is a 53 y.o. female Summary of history :   H/o rectal bleeding with colonoscopy in 09/2020 showing internal hemorroids . Treated with conservative measures.    Seen by Celso Amy in July 2024 treated conservatively for her internal hemorrhoids.   03/17/2023 hemoglobin 12.3 g iron studies normal. She says that she has had blood mixed with the stools significant amount.  Denies any NSAID use.  She is says that the shape of her stool also has changed.   Some degree of urgency.  And sensation of incomplete bowel movement.  On 07/07/2023 I performed a flexible sigmoidoscopy and was noted to have only internal hemorrhoids.   She is here for follow-up. Current Outpatient Medications  Medication Sig Dispense Refill   Accu-Chek Softclix Lancets lancets Use 1 lancet to check glucose three times daily as needed for diabetes 100 each 12   albuterol (PROVENTIL) (2.5 MG/3ML) 0.083% nebulizer solution USE 1 VIAL IN NEBULIZER EVERY 4 HOURS AS NEEDED FOR WHEEZING AND FOR SHORTNESS OF BREATH (MIX WITH IPRATROPIUM) 90 mL 0   albuterol (VENTOLIN HFA) 108 (90 Base) MCG/ACT inhaler Inhale 1-2 puffs into the lungs every 6 (six) hours as needed for wheezing or shortness of breath. 8 g 5   clonazePAM (KLONOPIN) 0.5 MG tablet Take 0.5 mg by mouth 2 (two) times daily as needed for anxiety. Take 1/2 tablet by mouth twice a day     clotrimazole-betamethasone (LOTRISONE) cream Apply 1 Application topically daily. To affected areas of right foot until resolved. 45 g 2   furosemide (LASIX) 40 MG tablet TAKE 1 & 1/2 (ONE & ONE-HALF) TABLETS BY MOUTH ONCE DAILY 45 tablet 3   gabapentin (NEURONTIN) 300 MG capsule Take 300 mg by mouth daily. Take 1  to 2 capsules by mouth nightly for sleep     glucose blood (ACCU-CHEK GUIDE TEST) test strip Use 1 test strip three times daily as needed to check glucose for type 2 diabetes E11.65 100 each 12   HYDROcodone-acetaminophen (NORCO/VICODIN) 5-325 MG tablet Take 1-2 tablets by mouth every 6 (six) hours as needed for moderate pain (pain score 4-6). 30 tablet 0   ipratropium (ATROVENT) 0.02 % nebulizer solution TAKE 2.5MLS BY NEBULIZATION FOUR TIMES DAILY WITH ALBUTEROL 90 mL 0   irbesartan (AVAPRO) 150 MG tablet Take 1 tablet by mouth once daily 30 tablet 5   lamoTRIgine (LAMICTAL) 200 MG tablet Take 200 mg in the am, and 300 mg at night     metoprolol succinate (TOPROL-XL) 50 MG 24 hr tablet TAKE 1 & 1/2 (ONE & ONE-HALF) TABLETS BY MOUTH ONCE DAILY 45 tablet 1   nortriptyline (PAMELOR) 10 MG capsule Take 10 mg by mouth. Take 1 tablet po  in the morning     nortriptyline (PAMELOR) 50 MG capsule Take 50 mg by mouth at bedtime. Take 1 capsule by mouth at bedtime     terbinafine (LAMISIL) 250 MG tablet Take 1 tablet (250 mg total) by mouth daily. 90 tablet 0   No current facility-administered medications for this visit.    Allergies as of 08/02/2023 - Review Complete 07/18/2023  Allergen Reaction Noted   Keppra [levetiracetam] Other (See Comments) 09/05/2019   Duloxetine  11/05/2019  Interval history   ***/***/202*   ***/***/2024   ROS:  General: Negative for anorexia, weight loss, fever, chills, fatigue, weakness. ENT: Negative for hoarseness, difficulty swallowing , nasal congestion. CV: Negative for chest pain, angina, palpitations, dyspnea on exertion, peripheral edema.  Respiratory: Negative for dyspnea at rest, dyspnea on exertion, cough, sputum, wheezing.  GI: See history of present illness. GU:  Negative for dysuria, hematuria, urinary incontinence, urinary frequency, nocturnal urination.  Endo: Negative for unusual weight change.    Physical Examination:   LMP 05/28/2015    General: Well-nourished, well-developed in no acute distress.  Eyes: No icterus. Conjunctivae pink. Mouth: Oropharyngeal mucosa moist and pink , no lesions erythema or exudate. Lungs: Clear to auscultation bilaterally. Non-labored. Heart: Regular rate and rhythm, no murmurs rubs or gallops.  Abdomen: Bowel sounds are normal, nontender, nondistended, no hepatosplenomegaly or masses, no abdominal bruits or hernia , no rebound or guarding.   Extremities: No lower extremity edema. No clubbing or deformities. Neuro: Alert and oriented x 3.  Grossly intact. Skin: Warm and dry, no jaundice.   Psych: Alert and cooperative, normal mood and affect.   Imaging Studies: No results found.  Assessment and Plan:   [name] is a 53 y.o. y/o female here to follow-up for internal hemorrhoids    Dr Wyline Mood  MD,MRCP [facility]) Follow up in ***

## 2023-08-05 ENCOUNTER — Ambulatory Visit: Attending: Cardiology | Admitting: Cardiology

## 2023-08-05 ENCOUNTER — Encounter: Payer: Self-pay | Admitting: Cardiology

## 2023-08-05 VITALS — BP 107/74 | HR 91 | Ht 62.0 in | Wt 273.4 lb

## 2023-08-05 DIAGNOSIS — E0800 Diabetes mellitus due to underlying condition with hyperosmolarity without nonketotic hyperglycemic-hyperosmolar coma (NKHHC): Secondary | ICD-10-CM

## 2023-08-05 DIAGNOSIS — I5030 Unspecified diastolic (congestive) heart failure: Secondary | ICD-10-CM

## 2023-08-05 DIAGNOSIS — I10 Essential (primary) hypertension: Secondary | ICD-10-CM

## 2023-08-05 MED ORDER — TORSEMIDE 40 MG PO TABS: 40.0000 mg | ORAL_TABLET | Freq: Every day | ORAL | 3 refills | Status: DC

## 2023-08-05 MED ORDER — IRBESARTAN 75 MG PO TABS: 75.0000 mg | ORAL_TABLET | Freq: Every day | ORAL | 3 refills | Status: DC

## 2023-08-05 NOTE — Patient Instructions (Signed)
 Medication Instructions:  Your physician recommends the following medication changes.  STOP TAKING: Lasix  START TAKING: Torsemide 40 mg daily Ozempic TYPICALLY Inject 0.25 mg into the skin once a week for 28 days, THEN 0.5 mg once a week for 28 days, THEN 1 mg once a week. This will be sent to pharmacy for approval and dispensing  DECREASE: Avapro 75 mg daily   *If you need a refill on your cardiac medications before your next appointment, please call your pharmacy*   Lab Work: Your provider would like for you to return in 10 days to have the following labs drawn: BMeT.   Please go to [facility] [address] ([facility] [address], [place] [address] You do not need an appointment.  They are open from 8 am- 4:30 pm.  Lunch from 1:00 pm- 2:00 pm You DO NOT need to be fasting.   You may also go to one of the following [facility]:  [address] [place], [place] [address] Phone: [phone] Lab hours: Mon-Fri 8 am- 5 pm    Lunch 12 pm- 1 pm  [address] [place],  [place]  [address]  [place] Phone: [phone] Lab hours: 7 am- 4 pm Lunch 12 pm-1 pm   [address] [place],  [place]  [address]  [place] Phone: [phone] Lab hours: Mon-Fri 8 am- 5 pm    Lunch 12 pm- 1 pm  If you have labs (blood work) drawn today and your tests are completely normal, you will receive your results only by: MyChart Message (if you have MyChart) OR A paper copy in the mail If you have any lab test that is abnormal or we need to change your treatment, we will call you to review the results.   Follow-Up: At [facility], you and your health needs are our priority.  As part of our continuing mission to provide you with exceptional heart care, we have created designated Provider Care Teams.  These Care Teams include your primary Cardiologist (physician) and Advanced Practice Providers (APPs -  Physician Assistants and Nurse Practitioners) who all work together to  provide you with the care you need, when you need it.   Your next appointment:   2-3 month(s)  Provider:   Debbe Odea, MD

## 2023-08-05 NOTE — Progress Notes (Signed)
 Cardiology Office Note:    Date:  08/05/2023   ID:  [name], DOB 1971/02/19, MRN [id number]  PCP:  Sallyanne Kuster, NP   [facility] Providers Cardiologist:  Debbe Odea, MD     Referring MD: Sallyanne Kuster, NP   Chief Complaint  Patient presents with   Shortness of Breath    Patient states that she had been experiencing shortness of breath and some tiredness. Patient states that her right lung feels clogged. Meds reviewed.     History of Present Illness:    [name] is a 53 y.o. female with a hx of HFpEF, hypertension, morbid obesity, asthma presenting for follow-up.    Endorse having shortness of breath over the past month.  She can barely walk without getting out of breath.  Also has leg edema, sometimes improved with taking Lasix.  Takes Lasix 60 mg daily. denies chest pain.  Has occasional wheezing.  Recent A1c was elevated at 6.6.  Prior notes/testing Echocardiogram obtained 04/2022 showed EF 60 to 65%, impaired relaxation.  Past Medical History:  Diagnosis Date   Allergy    environmental   Anemia    Asthma    Bronchitis, acute    Complication of anesthesia    HAD ASTHMA ATTACK WHILE UNDER   Gastroesophageal reflux disease without esophagitis 11/02/2017   Grade I diastolic dysfunction    Hypertension    Mild mitral regurgitation by prior echocardiogram    Morbid obesity with body mass index (BMI) of 45.0 to 49.9 in adult [facility])    Obstructive sleep apnea on CPAP    Seizure (HCC)    None since 01/2023   Sleep apnea    CPAP   Vertigo    10/24    Past Surgical History:  Procedure Laterality Date   ABDOMINAL HYSTERECTOMY N/A 06/09/2015   Procedure: HYSTERECTOMY ABDOMINAL with Bilateral Salpingectomy;  Surgeon: Suzy Bouchard, MD;  Location: [facility] ORS;  Service: Gynecology;  Laterality: N/A;   CARPAL TUNNEL RELEASE Left 07/15/2023   Procedure: CARPAL TUNNEL RELEASE;  Surgeon: Kennedy Bucker, MD;  Location: [facility];   Service: Orthopedics;  Laterality: Left;   CATARACT EXTRACTION W/PHACO Right 05/17/2023   Procedure: CATARACT EXTRACTION PHACO AND INTRAOCULAR LENS PLACEMENT (IOC) RIGHT MALYUGIN OMIDRIA 9.07 00:47.9;  Surgeon: Estanislado Pandy, MD;  Location: [facility];  Service: Ophthalmology;  Laterality: Right;   CATARACT EXTRACTION W/PHACO Left 05/30/2023   Procedure: CATARACT EXTRACTION PHACO AND INTRAOCULAR LENS PLACEMENT (IOC) LEFT [name] 3.73 00:32.0;  Surgeon: Estanislado Pandy, MD;  Location: [facility];  Service: Ophthalmology;  Laterality: Left;   CESAREAN SECTION  [id number]   COLONOSCOPY WITH PROPOFOL N/A 10/24/2020   Procedure: COLONOSCOPY WITH PROPOFOL;  Surgeon: Midge Minium, MD;  Location: [facility];  Service: Endoscopy;  Laterality: N/A;  sleep apnea   DORSAL COMPARTMENT RELEASE Left 07/15/2023   Procedure: RELEASE DORSAL COMPARTMENT (DEQUERVAIN);  Surgeon: Kennedy Bucker, MD;  Location: [facility];  Service: Orthopedics;  Laterality: Left;   FINGER SURGERY  1992   FLEXIBLE SIGMOIDOSCOPY N/A 07/07/2023   Procedure: FLEXIBLE SIGMOIDOSCOPY;  Surgeon: Wyline Mood, MD;  Location: [facility] ENDOSCOPY;  Service: Gastroenterology;  Laterality: N/A;  Unsedated per office; patient states she might want sedation   GANGLION CYST EXCISION Left 07/15/2023   Procedure: REMOVAL GANGLION OF WRIST;  Surgeon: Kennedy Bucker, MD;  Location: [facility];  Service: Orthopedics;  Laterality: Left;   GASTRIC BYPASS     INGUINAL HERNIA REPAIR Right  2010   REPLACEMENT TOTAL KNEE Right     Current Medications: Current Meds  Medication Sig   Accu-Chek Softclix Lancets lancets Use 1 lancet to check glucose three times daily as needed for diabetes   albuterol (PROVENTIL) (2.5 MG/3ML) 0.083% nebulizer solution USE 1 VIAL IN NEBULIZER EVERY 4 HOURS AS NEEDED FOR WHEEZING AND FOR SHORTNESS OF BREATH (MIX WITH IPRATROPIUM)   albuterol (VENTOLIN HFA) 108 (90 Base) MCG/ACT  inhaler Inhale 1-2 puffs into the lungs every 6 (six) hours as needed for wheezing or shortness of breath.   clonazePAM (KLONOPIN) 0.5 MG tablet Take 0.5 mg by mouth 2 (two) times daily as needed for anxiety. Take 1/2 tablet by mouth twice a day   gabapentin (NEURONTIN) 300 MG capsule Take 300 mg by mouth daily. Take 1 to 2 capsules by mouth nightly for sleep   glucose blood (ACCU-CHEK GUIDE TEST) test strip Use 1 test strip three times daily as needed to check glucose for type 2 diabetes E11.65   ipratropium (ATROVENT) 0.02 % nebulizer solution TAKE 2.5MLS BY NEBULIZATION FOUR TIMES DAILY WITH ALBUTEROL   lamoTRIgine (LAMICTAL) 200 MG tablet Take 200 mg in the am, and 300 mg at night   metoprolol succinate (TOPROL-XL) 50 MG 24 hr tablet TAKE 1 & 1/2 (ONE & ONE-HALF) TABLETS BY MOUTH ONCE DAILY   nortriptyline (PAMELOR) 10 MG capsule Take 10 mg by mouth. Take 1 tablet po  in the morning   nortriptyline (PAMELOR) 50 MG capsule Take 50 mg by mouth at bedtime. Take 1 capsule by mouth at bedtime   terbinafine (LAMISIL) 250 MG tablet Take 1 tablet (250 mg total) by mouth daily.   torsemide 40 MG TABS Take 40 mg by mouth daily.   [DISCONTINUED] furosemide (LASIX) 40 MG tablet TAKE 1 & 1/2 (ONE & ONE-HALF) TABLETS BY MOUTH ONCE DAILY   [DISCONTINUED] irbesartan (AVAPRO) 150 MG tablet Take 1 tablet by mouth once daily     Allergies:   Keppra [levetiracetam] and Duloxetine   Social History   Socioeconomic History   Marital status: Married    Spouse name: Not on file   Number of children: Not on file   Years of education: Not on file   Highest education level: Not on file  Occupational History   Not on file  Tobacco Use   Smoking status: Never   Smokeless tobacco: Never  Vaping Use   Vaping status: Never Used  Substance and Sexual Activity   Alcohol use: No   Drug use: No   Sexual activity: Yes  Other Topics Concern   Not on file  Social History Narrative   Not on file   Social Drivers  of Health   Financial Resource Strain: Not on file  Food Insecurity: Not on file  Transportation Needs: Not on file  Physical Activity: Not on file  Stress: Not on file  Social Connections: Not on file     Family History: The patient's family history includes Cancer in her mother; Congestive Heart Failure in her father; Diabetes in her father and mother; Hypertension in her mother.  ROS:   Please see the history of present illness.     All other systems reviewed and are negative.  EKGs/Labs/Other Studies Reviewed:    The following studies were reviewed today:       Recent Labs: 08/10/2022: ALT 16 03/17/2023: BUN 9; Creatinine, Ser 0.93; Hemoglobin 12.3; Platelets 294; Potassium 3.5; Sodium 136  Recent Lipid Panel    Component  Value Date/Time   CHOL 195 03/23/2023 1052   CHOL 158 06/28/2012 0746   TRIG 168 (H) 03/23/2023 1052   TRIG 125 06/28/2012 0746   HDL 48 03/23/2023 1052   HDL 33 (L) 06/28/2012 0746   CHOLHDL 4.1 03/23/2023 1052   VLDL 34 03/23/2023 1052   VLDL 25 06/28/2012 0746   LDLCALC 113 (H) 03/23/2023 1052   LDLCALC 100 06/28/2012 0746     Risk Assessment/Calculations:             Physical Exam:    VS:  BP 107/74   Pulse 91   Ht 5\' 2"  (1.575 m)   Wt 273 lb 6.4 oz (124 kg)   LMP 05/28/2015   SpO2 97%   BMI 50.01 kg/m     Wt Readings from Last 3 Encounters:  08/05/23 273 lb 6.4 oz (124 kg)  07/18/23 276 lb 12.8 oz (125.6 kg)  07/15/23 273 lb (123.8 kg)     GEN:  Well nourished, well developed in no acute distress HEENT: Normal NECK: No JVD; No carotid bruits CARDIAC: RRR, no murmurs, rubs, gallops RESPIRATORY:  Clear to auscultation without rales, wheezing or rhonchi  ABDOMEN: Soft, non-tender, non-distended  MUSCULOSKELETAL:  1+ edema; No deformity  SKIN: Warm and dry NEUROLOGIC:  Alert and oriented x 3 PSYCHIATRIC:  Normal affect   ASSESSMENT:    1. Heart failure with preserved ejection fraction, unspecified HF chronicity (HCC)    2. Morbid obesity (HCC)   3. Primary hypertension   4. Diabetes mellitus due to underlying condition with hyperosmolarity without coma, without long-term current use of insulin (HCC)   5. Essential hypertension     PLAN:    In order of problems listed above:  HFpEF, EF 60 to 65%, impaired relaxation, 1+ edema.  Stop Lasix, start torsemide 40 mg daily, check BMP in 10 days.. Morbid obesity, low-calorie diet, weight loss advised.  Start Ozempic.  Patient also diabetic. Hypertension, BP controlled.  Reduce irbesartan to 75 mg daily, continue Toprol-XL 25 mg daily.  Follow-up in 6-8 weeks      Medication Adjustments/Labs and Tests Ordered: Current medicines are reviewed at length with the patient today.  Concerns regarding medicines are outlined above.  Orders Placed This Encounter  Procedures   Basic metabolic panel   Meds ordered this encounter  Medications   irbesartan (AVAPRO) 75 MG tablet    Sig: Take 1 tablet (75 mg total) by mouth daily.    Dispense:  90 tablet    Refill:  3   torsemide 40 MG TABS    Sig: Take 40 mg by mouth daily.    Dispense:  90 tablet    Refill:  3    Patient Instructions  Medication Instructions:  Your physician recommends the following medication changes.  STOP TAKING: Lasix  START TAKING: Torsemide 40 mg daily Ozempic TYPICALLY Inject 0.25 mg into the skin once a week for 28 days, THEN 0.5 mg once a week for 28 days, THEN 1 mg once a week. This will be sent to pharmacy for approval and dispensing  DECREASE: Avapro 75 mg daily   *If you need a refill on your cardiac medications before your next appointment, please call your pharmacy*   Lab Work: Your provider would like for you to return in 10 days to have the following labs drawn: BMeT.   Please go to [facility] [address] ([facility] [address], [place] [address] You do not need an appointment.  They are open from 8 am- 4:30 pm.  Lunch from 1:00 pm-  2:00 pm You DO NOT need to be fasting.   You may also go to one of the following [facility]:  [address] [place], [place] [address] Phone: [phone] Lab hours: Mon-Fri 8 am- 5 pm    Lunch 12 pm- 1 pm  [address] [place],  [place]  [address]  [place] Phone: [phone] Lab hours: 7 am- 4 pm Lunch 12 pm-1 pm   [address] [place],  [place]  [address]  [place] Phone: [phone] Lab hours: Mon-Fri 8 am- 5 pm    Lunch 12 pm- 1 pm  If you have labs (blood work) drawn today and your tests are completely normal, you will receive your results only by: MyChart Message (if you have MyChart) OR A paper copy in the mail If you have any lab test that is abnormal or we need to change your treatment, we will call you to review the results.   Follow-Up: At [facility], you and your health needs are our priority.  As part of our continuing mission to provide you with exceptional heart care, we have created designated Provider Care Teams.  These Care Teams include your primary Cardiologist (physician) and Advanced Practice Providers (APPs -  Physician Assistants and Nurse Practitioners) who all work together to provide you with the care you need, when you need it.   Your next appointment:   2-3 month(s)  Provider:   Debbe Odea, MD       Signed, Debbe Odea, MD  08/05/2023 1:43 PM    [facility]

## 2023-08-08 ENCOUNTER — Other Ambulatory Visit (HOSPITAL_COMMUNITY): Payer: Self-pay

## 2023-08-08 ENCOUNTER — Telehealth: Payer: Self-pay | Admitting: Pharmacy Technician

## 2023-08-08 NOTE — Telephone Encounter (Signed)
 Pharmacy Patient Advocate Encounter   Received notification from Physician's Office that prior authorization for ozempic is required/requested.   Insurance verification completed.   The patient is insured through  [facility]  .   Per test claim: The current 08/08/23 day co-pay is, $140.00- one month.  No PA needed at this time. This test claim was processed through [facility]- copay amounts may vary at other pharmacies due to pharmacy/plan contracts, or as the patient moves through the different stages of their insurance plan.

## 2023-08-16 ENCOUNTER — Other Ambulatory Visit
Admission: RE | Admit: 2023-08-16 | Discharge: 2023-08-16 | Disposition: A | Discharge status: Home / Self Care | Source: Ambulatory Visit | Attending: Nurse Practitioner | Admitting: Nurse Practitioner

## 2023-08-16 ENCOUNTER — Other Ambulatory Visit: Payer: Self-pay | Admitting: Emergency Medicine

## 2023-08-16 DIAGNOSIS — E785 Hyperlipidemia, unspecified: Secondary | ICD-10-CM | POA: Diagnosis not present

## 2023-08-16 DIAGNOSIS — I5030 Unspecified diastolic (congestive) heart failure: Secondary | ICD-10-CM

## 2023-08-16 DIAGNOSIS — E1169 Type 2 diabetes mellitus with other specified complication: Secondary | ICD-10-CM | POA: Insufficient documentation

## 2023-08-16 LAB — HEMOGLOBIN A1C
Hgb A1c MFr Bld: 6.7 % — ABNORMAL HIGH (ref 4.8–5.6)
Mean Plasma Glucose: 145.59 mg/dL

## 2023-08-16 LAB — LIPID PANEL
Cholesterol: 194 mg/dL (ref 0–200)
HDL: 39 mg/dL — ABNORMAL LOW (ref >40)
LDL Cholesterol: 128 mg/dL — ABNORMAL HIGH (ref 0–99)
Total CHOL/HDL Ratio: 5 ratio
Triglycerides: 135 mg/dL (ref <150)
VLDL: 27 mg/dL (ref 0–40)

## 2023-08-17 ENCOUNTER — Encounter: Payer: Self-pay | Admitting: Nurse Practitioner

## 2023-08-17 ENCOUNTER — Ambulatory Visit: Payer: 59 | Admitting: Nurse Practitioner

## 2023-08-17 VITALS — BP 110/82 | HR 85 | Temp 98.5°F | Resp 16 | Ht 62.0 in | Wt 271.0 lb

## 2023-08-17 DIAGNOSIS — E1159 Type 2 diabetes mellitus with other circulatory complications: Secondary | ICD-10-CM

## 2023-08-17 DIAGNOSIS — I152 Hypertension secondary to endocrine disorders: Secondary | ICD-10-CM

## 2023-08-17 DIAGNOSIS — R7989 Other specified abnormal findings of blood chemistry: Secondary | ICD-10-CM | POA: Diagnosis not present

## 2023-08-17 DIAGNOSIS — E1169 Type 2 diabetes mellitus with other specified complication: Secondary | ICD-10-CM | POA: Diagnosis not present

## 2023-08-17 DIAGNOSIS — E785 Hyperlipidemia, unspecified: Secondary | ICD-10-CM

## 2023-08-17 DIAGNOSIS — I5032 Chronic diastolic (congestive) heart failure: Secondary | ICD-10-CM

## 2023-08-17 LAB — BASIC METABOLIC PANEL
BUN/Creatinine Ratio: 10 (ref 9–23)
BUN: 12 mg/dL (ref 6–24)
CO2: 28 mmol/L (ref 20–29)
Calcium: 9.5 mg/dL (ref 8.7–10.2)
Chloride: 99 mmol/L (ref 96–106)
Creatinine, Ser: 1.17 mg/dL — ABNORMAL HIGH (ref 0.57–1.00)
Glucose: 127 mg/dL — ABNORMAL HIGH (ref 70–99)
Potassium: 4.2 mmol/L (ref 3.5–5.2)
Sodium: 142 mmol/L (ref 134–144)
eGFR: 56 mL/min/{1.73_m2} — ABNORMAL LOW (ref >59)

## 2023-08-17 NOTE — Progress Notes (Signed)
 [facility] [address] [place], [place] [address]  Internal MEDICINE  Office Visit Note  Patient Name: [name]  [id number]  [id number]  Date of Service: 08/17/2023  Chief Complaint  Patient presents with   Gastroesophageal Reflux   Hypertension   Follow-up    HPI [name] presents for a follow-up visit for diabetes, heart failure, CKD and hypertension.  Diabetes -- A1c is stable at 6.7 Egfr is 57 but when calculated for race -- is 74. Creatinine is 1.17 Irbesartan  dropped to 1/2 tablet daily, and lasix  was changed to torsemide  40 mg daily.  Lost 5 lbs in the past month.  Heart failure -- seeing specialist.      Current Medication: Outpatient Encounter Medications as of 08/17/2023  Medication Sig   Accu-Chek Softclix Lancets lancets Use 1 lancet to check glucose three times daily as needed for diabetes   albuterol  (PROVENTIL ) (2.5 MG/3ML) 0.083% nebulizer solution USE 1 VIAL IN NEBULIZER EVERY 4 HOURS AS NEEDED FOR WHEEZING AND FOR SHORTNESS OF BREATH (MIX WITH IPRATROPIUM)   albuterol  (VENTOLIN  HFA) 108 (90 Base) MCG/ACT inhaler Inhale 1-2 puffs into the lungs every 6 (six) hours as needed for wheezing or shortness of breath.   clonazePAM (KLONOPIN) 0.5 MG tablet Take 0.5 mg by mouth 2 (two) times daily as needed for anxiety. Take 1/2 tablet by mouth twice a day   clotrimazole -betamethasone  (LOTRISONE ) cream Apply 1 Application topically daily. To affected areas of right foot until resolved.   gabapentin (NEURONTIN) 300 MG capsule Take 300 mg by mouth daily. Take 1 to 2 capsules by mouth nightly for sleep   glucose blood (ACCU-CHEK GUIDE TEST) test strip Use 1 test strip three times daily as needed to check glucose for type 2 diabetes E11.65   HYDROcodone -acetaminophen  (NORCO/VICODIN) 5-325 MG tablet Take 1-2 tablets by mouth every 6 (six) hours as needed for moderate pain (pain score 4-6).   ipratropium (ATROVENT ) 0.02 % nebulizer solution TAKE 2.5MLS BY  NEBULIZATION FOUR TIMES DAILY WITH ALBUTEROL    irbesartan  (AVAPRO ) 75 MG tablet Take 1 tablet (75 mg total) by mouth daily.   lamoTRIgine (LAMICTAL) 200 MG tablet Take 200 mg in the am, and 300 mg at night   metoprolol  succinate (TOPROL -XL) 50 MG 24 hr tablet TAKE 1 & 1/2 (ONE & ONE-HALF) TABLETS BY MOUTH ONCE DAILY   nortriptyline (PAMELOR) 10 MG capsule Take 10 mg by mouth. Take 1 tablet po  in the morning   nortriptyline (PAMELOR) 50 MG capsule Take 50 mg by mouth at bedtime. Take 1 capsule by mouth at bedtime   terbinafine  (LAMISIL ) 250 MG tablet Take 1 tablet (250 mg total) by mouth daily.   torsemide  40 MG TABS Take 40 mg by mouth daily.   No facility-administered encounter medications on file as of 08/17/2023.    Surgical History: Past Surgical History:  Procedure Laterality Date   ABDOMINAL HYSTERECTOMY N/A 06/09/2015   Procedure: HYSTERECTOMY ABDOMINAL with Bilateral Salpingectomy;  Surgeon: Carolynn Citrin, MD;  Location: [facility] ORS;  Service: Gynecology;  Laterality: N/A;   CARPAL TUNNEL RELEASE Left 07/15/2023   Procedure: CARPAL TUNNEL RELEASE;  Surgeon: Molli Angelucci, MD;  Location: [facility];  Service: Orthopedics;  Laterality: Left;   CATARACT EXTRACTION W/PHACO Right 05/17/2023   Procedure: CATARACT EXTRACTION PHACO AND INTRAOCULAR LENS PLACEMENT (IOC) RIGHT MALYUGIN OMIDRIA 9.07 00:47.9;  Surgeon: Trudi Fus, MD;  Location: [facility];  Service: Ophthalmology;  Laterality: Right;   CATARACT EXTRACTION W/PHACO Left 05/30/2023  Procedure: CATARACT EXTRACTION PHACO AND INTRAOCULAR LENS PLACEMENT (IOC) LEFT [name] 3.73 00:32.0;  Surgeon: Trudi Fus, MD;  Location: [facility];  Service: Ophthalmology;  Laterality: Left;   CESAREAN SECTION  [id number]   COLONOSCOPY WITH PROPOFOL  N/A 10/24/2020   Procedure: COLONOSCOPY WITH PROPOFOL ;  Surgeon: Marnee Sink, MD;  Location: [facility];  Service: Endoscopy;  Laterality:  N/A;  sleep apnea   DORSAL COMPARTMENT RELEASE Left 07/15/2023   Procedure: RELEASE DORSAL COMPARTMENT (DEQUERVAIN);  Surgeon: Molli Angelucci, MD;  Location: [facility];  Service: Orthopedics;  Laterality: Left;   FINGER SURGERY  1992   FLEXIBLE SIGMOIDOSCOPY N/A 07/07/2023   Procedure: FLEXIBLE SIGMOIDOSCOPY;  Surgeon: Luke Salaam, MD;  Location: [facility] END[facility];  Service: Gastroenterology;  Laterality: N/A;  Unsedated per office; patient states she might want sedation   GANGLION CYST EXCISION Left 07/15/2023   Procedure: REMOVAL GANGLION OF WRIST;  Surgeon: Molli Angelucci, MD;  Location: [facility];  Service: Orthopedics;  Laterality: Left;   GASTRIC BYPASS     INGUINAL HERNIA REPAIR Right 2010   REPLACEMENT TOTAL KNEE Right     Medical History: Past Medical History:  Diagnosis Date   Allergy    environmental   Anemia    Asthma    Bronchitis, acute    Complication of anesthesia    HAD ASTHMA ATTACK WHILE UNDER   Gastroesophageal reflux disease without esophagitis 11/02/2017   Grade I diastolic dysfunction    Hypertension    Mild mitral regurgitation by prior echocardiogram    Morbid obesity with body mass index (BMI) of 45.0 to 49.9 in adult [facility])    Obstructive sleep apnea on CPAP    Seizure (HCC)    None since 01/2023   Sleep apnea    CPAP   Vertigo    10/24    Family History: Family History  Problem Relation Age of Onset   Diabetes Mother    Hypertension Mother    Cancer Mother    Congestive Heart Failure Father    Diabetes Father     Social History   Socioeconomic History   Marital status: Married    Spouse name: Not on file   Number of children: Not on file   Years of education: Not on file   Highest education level: Not on file  Occupational History   Not on file  Tobacco Use   Smoking status: Never   Smokeless tobacco: Never  Vaping Use   Vaping status: Never Used  Substance and Sexual Activity   Alcohol use: No   Drug use: No    Sexual activity: Yes  Other Topics Concern   Not on file  Social History Narrative   Not on file   Social Drivers of Health   Financial Resource Strain: Not on file  Food Insecurity: Not on file  Transportation Needs: Not on file  Physical Activity: Not on file  Stress: Not on file  Social Connections: Not on file  Intimate Partner Violence: Not on file      Review of Systems  Constitutional:  Positive for fatigue. Negative for chills and unexpected weight change.  HENT:  Negative for congestion, postnasal drip, rhinorrhea, sneezing and sore throat.   Eyes:  Negative for redness.  Respiratory:  Positive for shortness of breath (intermittent). Negative for cough, chest tightness and wheezing.   Cardiovascular: Negative.  Negative for chest pain and palpitations.  Gastrointestinal: Negative.  Negative for abdominal pain, constipation, diarrhea, nausea and vomiting.  Genitourinary:  Negative for dysuria and frequency.  Musculoskeletal:  Positive for arthralgias. Negative for back pain, joint swelling and neck pain.  Skin:  Negative for rash.  Neurological: Negative.  Negative for tremors and numbness.  Hematological:  Negative for adenopathy. Does not bruise/bleed easily.  Psychiatric/Behavioral:  Negative for behavioral problems (Depression), self-injury, sleep disturbance and suicidal ideas. The patient is not nervous/anxious.     Vital Signs: BP 110/82   Pulse 85   Temp 98.5 F (36.9 C)   Resp 16   Ht 5\' 2"  (1.575 m)   Wt 271 lb (122.9 kg)   LMP 05/28/2015   SpO2 96%   BMI 49.57 kg/m    Physical Exam Vitals reviewed.  Constitutional:      General: She is not in acute distress.    Appearance: Normal appearance. She is obese. She is not ill-appearing.  HENT:     Head: Normocephalic and atraumatic.  Eyes:     Pupils: Pupils are equal, round, and reactive to light.  Cardiovascular:     Rate and Rhythm: Normal rate and regular rhythm.     Heart sounds: Normal  heart sounds. No murmur heard. Pulmonary:     Effort: Pulmonary effort is normal. No respiratory distress.     Breath sounds: Normal breath sounds. No wheezing.  Skin:    General: Skin is warm and dry.     Capillary Refill: Capillary refill takes less than 2 seconds.  Neurological:     Mental Status: She is alert and oriented to person, place, and time.  Psychiatric:        Mood and Affect: Mood normal.        Behavior: Behavior normal.        Assessment/Plan: 1. Type 2 diabetes mellitus with other specified complication, without long-term current use of insulin (HCC) (Primary) Continue diet and lifestyle modifications. Not on any diabetic medications at this time. Diet controlled.   2. Hypertension associated with type 2 diabetes mellitus (HCC) Stable, continue irbesartan , metoprolol  and torsemide  as prescribed.  3. Hyperlipidemia associated with type 2 diabetes mellitus (HCC) Not currently on statin therapy. Working on diet modifications currently.   4. Chronic heart failure with preserved ejection fraction (HCC) Followed by cardiology.   5. Elevated serum creatinine Will repeat lab in a few months    General Counseling: Cacie verbalizes understanding of the findings of todays visit and agrees with plan of treatment. I have discussed any further diagnostic evaluation that may be needed or ordered today. We also reviewed her medications today. she has been encouraged to call the office with any questions or concerns that should arise related to todays visit.    No orders of the defined types were placed in this encounter.   No orders of the defined types were placed in this encounter.   Return in about 4 months (around 12/17/2023) for F/U, Recheck A1C, Elynor Kallenberger PCP.   Total time spent:30 Minutes Time spent includes review of chart, medications, test results, and follow up plan with the patient.   [place] Controlled Substance Database was reviewed by me.  This patient  was seen by Laurence Pons, FNP-C in collaboration with Dr. Verneta Gone as a part of collaborative care agreement.   Tyla Burgner R. Bobbi Burow, MSN, FNP-C Internal medicine

## 2023-08-18 ENCOUNTER — Other Ambulatory Visit: Payer: Self-pay

## 2023-08-18 DIAGNOSIS — I5030 Unspecified diastolic (congestive) heart failure: Secondary | ICD-10-CM

## 2023-08-23 ENCOUNTER — Telehealth: Payer: Self-pay | Admitting: Cardiology

## 2023-08-23 NOTE — Telephone Encounter (Signed)
 Called patient. She states she is still having SOB with exertion. She knew that Dr.Agbor-Etang wanted her to try to exercise more, but she has struggled to get up and move due to the SOB. She states she does not want to fall out while walking because she feels so SOB. I advised patient to purchase a pulse ox to monitor these levels at home, when moving around.   Patient has started weighing daily- continued with fluid pill. Weight this morning was 266lb, which was an improvement from previous visit weight was 271lb.  Patient aware that weight is decreasing which shows fluid pill is working. However, we did discuss low sodium diet as well to continue this. Advised that it may take some time for breathing to improve due to weight and fluid, but I would check with MD as well for any further recommendations.   Patient aware blood work needed for 2 weeks for repeat. Patient will have this completed.   Advised to visit dentist due to bleeding gums, patient is not on blood thinner that could be causing this to occur. Patient verbalized understanding, thankful for call back.

## 2023-08-23 NOTE — Telephone Encounter (Signed)
 Pt c/o Shortness Of Breath: STAT if SOB developed within the last 24 hours or pt is noticeably SOB on the phone  1. Are you currently SOB (can you hear that pt is SOB on the phone)?   No  2. How long have you been experiencing SOB?   A couple of weeks  3. Are you SOB when sitting or when up moving around?   When up and moving around   4. Are you currently experiencing any other symptoms?    No  Patient stated she is not able to walk short distances as she gets out of breath.  Patient noted when she was brushing her teeth she had blood in her gums.  Patient stated she has been using inhaler more than normal.

## 2023-08-30 ENCOUNTER — Encounter: Payer: Self-pay | Admitting: Nurse Practitioner

## 2023-08-30 DIAGNOSIS — K644 Residual hemorrhoidal skin tags: Secondary | ICD-10-CM | POA: Insufficient documentation

## 2023-09-11 ENCOUNTER — Other Ambulatory Visit: Payer: Self-pay | Admitting: Podiatry

## 2023-09-12 ENCOUNTER — Other Ambulatory Visit: Payer: Self-pay | Admitting: *Deleted

## 2023-09-12 DIAGNOSIS — I5030 Unspecified diastolic (congestive) heart failure: Secondary | ICD-10-CM

## 2023-09-13 LAB — BASIC METABOLIC PANEL WITH GFR
BUN/Creatinine Ratio: 12 (ref 9–23)
BUN: 13 mg/dL (ref 6–24)
CO2: 27 mmol/L (ref 20–29)
Calcium: 9.5 mg/dL (ref 8.7–10.2)
Chloride: 100 mmol/L (ref 96–106)
Creatinine, Ser: 1.05 mg/dL — ABNORMAL HIGH (ref 0.57–1.00)
Glucose: 107 mg/dL — ABNORMAL HIGH (ref 70–99)
Potassium: 4.8 mmol/L (ref 3.5–5.2)
Sodium: 139 mmol/L (ref 134–144)
eGFR: 64 mL/min/{1.73_m2} (ref >59)

## 2023-09-14 ENCOUNTER — Encounter (INDEPENDENT_AMBULATORY_CARE_PROVIDER_SITE_OTHER): Admitting: Nurse Practitioner

## 2023-09-14 ENCOUNTER — Encounter: Payer: Self-pay | Admitting: Nurse Practitioner

## 2023-09-17 NOTE — Progress Notes (Signed)
 rescheduled

## 2023-09-21 ENCOUNTER — Encounter: Payer: Self-pay | Admitting: Podiatry

## 2023-09-21 ENCOUNTER — Ambulatory Visit: Payer: 59 | Admitting: Podiatry

## 2023-09-21 VITALS — Ht 62.0 in | Wt 271.6 lb

## 2023-09-21 DIAGNOSIS — B351 Tinea unguium: Secondary | ICD-10-CM | POA: Diagnosis not present

## 2023-09-21 MED ORDER — TERBINAFINE HCL 250 MG PO TABS: 250.0000 mg | ORAL_TABLET | Freq: Every day | ORAL | 0 refills | Status: AC

## 2023-09-21 MED ORDER — EFINACONAZOLE 10 % EX SOLN: 1.0000 [drp] | Freq: Every day | CUTANEOUS | 11 refills | Status: DC

## 2023-09-22 ENCOUNTER — Encounter: Payer: Self-pay | Admitting: Podiatry

## 2023-09-22 NOTE — Progress Notes (Signed)
  Subjective:  Patient ID: [name], female    DOB: 1971/02/03,  MRN: [id number]  Chief Complaint  Patient presents with   Nail Problem    Pt is here to f/u on right foot toenails due to fungus and discoloration, pt states she is taking the medication but she doesn't thinks it working.    53 y.o. female presents with the above complaint. History confirmed with patient.  She returns for follow-up she is using the Lamisil   Objective:  Physical Exam: warm, good capillary refill, no trophic changes or ulcerative lesions, normal DP and PT pulses, normal sensory exam, and onychomycosis.  Comparison to previous film shows about 30% improvement with proximal clearing  Assessment:   1. Onychomycosis      Plan:  Patient was evaluated and treated and all questions answered.  Doing well and seems to be responding to the Lamisil  with proximal clearing.  I recommend continuing antifungal treatment and refill of Lamisil  was sent to pharmacy, I also recommend dual therapy with efinaconazole  and this was sent to a specialty pharmacy.    Return in about 4 months (around 01/21/2024) for follow up after nail fungus treatment.

## 2023-09-29 ENCOUNTER — Other Ambulatory Visit: Payer: Self-pay | Admitting: Nurse Practitioner

## 2023-09-29 DIAGNOSIS — R000 Tachycardia, unspecified: Secondary | ICD-10-CM

## 2023-10-06 ENCOUNTER — Other Ambulatory Visit: Payer: Self-pay

## 2023-10-07 ENCOUNTER — Encounter: Payer: Self-pay | Admitting: Cardiology

## 2023-10-07 ENCOUNTER — Encounter: Payer: Self-pay | Admitting: Nurse Practitioner

## 2023-10-07 ENCOUNTER — Ambulatory Visit: Attending: Cardiology | Admitting: Cardiology

## 2023-10-07 VITALS — BP 106/72 | HR 92 | Ht 62.0 in | Wt 271.4 lb

## 2023-10-07 DIAGNOSIS — I10 Essential (primary) hypertension: Secondary | ICD-10-CM

## 2023-10-07 DIAGNOSIS — I5030 Unspecified diastolic (congestive) heart failure: Secondary | ICD-10-CM

## 2023-10-07 NOTE — Progress Notes (Signed)
 Cardiology Office Note:    Date:  10/07/2023   ID:  [name], DOB 08-06-1970, MRN [id number]  PCP:  Laurence Pons, NP   [facility] Providers Cardiologist:  Constancia Delton, MD     Referring MD: Laurence Pons, NP   Chief Complaint  Patient presents with   Follow-up    2-3 month follow up visit. Patient states that she is experiencing a little bit of shortness of breath. Meds reviewed.     History of Present Illness:    [name] is a 53 y.o. female with a hx of HFpEF, hypertension, morbid obesity, asthma presenting for follow-up.    Previously seen due to leg edema, morbid obesity.  Lasix  switched to torsemide  with good effect.  Follow-up BMP showed normal potassium, stable creatinine.  Still has some shortness of breath with exertion.  Endorsed eating carbs, sweets, sodas.  Has increased her activity to help with weight loss.   Prior notes/testing Echocardiogram obtained 04/2022 showed EF 60 to 65%, impaired relaxation.  Past Medical History:  Diagnosis Date   Allergy    environmental   Anemia    Asthma    Bronchitis, acute    Complication of anesthesia    HAD ASTHMA ATTACK WHILE UNDER   Gastroesophageal reflux disease without esophagitis 11/02/2017   Grade I diastolic dysfunction    Hypertension    Mild mitral regurgitation by prior echocardiogram    Morbid obesity with body mass index (BMI) of 45.0 to 49.9 in adult [facility])    Obstructive sleep apnea on CPAP    Seizure (HCC)    None since 01/2023   Sleep apnea    CPAP   Vertigo    10/24    Past Surgical History:  Procedure Laterality Date   ABDOMINAL HYSTERECTOMY N/A 06/09/2015   Procedure: HYSTERECTOMY ABDOMINAL with Bilateral Salpingectomy;  Surgeon: Carolynn Citrin, MD;  Location: [facility];  Service: Gynecology;  Laterality: N/A;   CARPAL TUNNEL RELEASE Left 07/15/2023   Procedure: CARPAL TUNNEL RELEASE;  Surgeon: Molli Angelucci, MD;  Location: [facility];  Service:  Orthopedics;  Laterality: Left;   CATARACT EXTRACTION W/PHACO Right 05/17/2023   Procedure: CATARACT EXTRACTION PHACO AND INTRAOCULAR LENS PLACEMENT (IOC) RIGHT MALYUGIN OMIDRIA 9.07 00:47.9;  Surgeon: Trudi Fus, MD;  Location: [facility];  Service: Ophthalmology;  Laterality: Right;   CATARACT EXTRACTION W/PHACO Left 05/30/2023   Procedure: CATARACT EXTRACTION PHACO AND INTRAOCULAR LENS PLACEMENT (IOC) LEFT [name] 3.73 00:32.0;  Surgeon: Trudi Fus, MD;  Location: [facility];  Service: Ophthalmology;  Laterality: Left;   CESAREAN SECTION  [id number]   COLONOSCOPY WITH PROPOFOL  N/A 10/24/2020   Procedure: COLONOSCOPY WITH PROPOFOL ;  Surgeon: Marnee Sink, MD;  Location: [facility];  Service: Endoscopy;  Laterality: N/A;  sleep apnea   DORSAL COMPARTMENT RELEASE Left 07/15/2023   Procedure: RELEASE DORSAL COMPARTMENT (DEQUERVAIN);  Surgeon: Molli Angelucci, MD;  Location: [facility];  Service: Orthopedics;  Laterality: Left;   FINGER SURGERY  1992   FLEXIBLE SIGMOIDOSCOPY N/A 07/07/2023   Procedure: FLEXIBLE SIGMOIDOSCOPY;  Surgeon: Luke Salaam, MD;  Location: [facility];  Service: Gastroenterology;  Laterality: N/A;  Unsedated per office; patient states she might want sedation   GANGLION CYST EXCISION Left 07/15/2023   Procedure: REMOVAL GANGLION OF WRIST;  Surgeon: Molli Angelucci, MD;  Location: [facility];  Service: Orthopedics;  Laterality: Left;   GASTRIC BYPASS     INGUINAL HERNIA REPAIR Right 2010   REPLACEMENT  TOTAL KNEE Right     Current Medications: Current Meds  Medication Sig   Accu-Chek Softclix Lancets lancets Use 1 lancet to check glucose three times daily as needed for diabetes   albuterol  (PROVENTIL ) (2.5 MG/3ML) 0.083% nebulizer solution USE 1 VIAL IN NEBULIZER EVERY 4 HOURS AS NEEDED FOR WHEEZING AND FOR SHORTNESS OF BREATH (MIX WITH IPRATROPIUM)   albuterol  (VENTOLIN  HFA) 108 (90 Base) MCG/ACT inhaler Inhale  1-2 puffs into the lungs every 6 (six) hours as needed for wheezing or shortness of breath.   Blood Glucose Monitoring Suppl (ACCU-CHEK GUIDE ME) w/Device KIT    clonazePAM (KLONOPIN) 0.5 MG tablet Take 0.5 mg by mouth 2 (two) times daily as needed for anxiety. Take 1/2 tablet by mouth twice a day   clotrimazole -betamethasone  (LOTRISONE ) cream Apply 1 Application topically daily. To affected areas of right foot until resolved.   Efinaconazole  10 % SOLN Apply 1 drop topically daily.   gabapentin (NEURONTIN) 300 MG capsule Take 300 mg by mouth daily. Take 1 to 2 capsules by mouth nightly for sleep   glucose blood (ACCU-CHEK GUIDE TEST) test strip Use 1 test strip three times daily as needed to check glucose for type 2 diabetes E11.65   HYDROcodone -acetaminophen  (NORCO/VICODIN) 5-325 MG tablet Take 1-2 tablets by mouth every 6 (six) hours as needed for moderate pain (pain score 4-6).   ipratropium (ATROVENT ) 0.02 % nebulizer solution TAKE 2.5MLS BY NEBULIZATION FOUR TIMES DAILY WITH ALBUTEROL    irbesartan  (AVAPRO ) 75 MG tablet Take 1 tablet (75 mg total) by mouth daily.   ketorolac  (ACULAR ) 0.5 % ophthalmic solution INSTILL ONE DROP INTO BOTH EYES FOUR TIMES DAILY FOR 7 DAYS. THEN TWICE DAILY FOR 14 DAYS. THEN ONCE DAILY FOR 7 DAYS   lamoTRIgine (LAMICTAL) 200 MG tablet Take 200 mg in the am, and 300 mg at night   metoprolol  succinate (TOPROL -XL) 50 MG 24 hr tablet TAKE 1 & 1/2 (ONE & ONE-HALF) TABLETS BY MOUTH ONCE DAILY   nortriptyline (PAMELOR) 10 MG capsule Take 10 mg by mouth. Take 1 tablet po  in the morning   nortriptyline (PAMELOR) 50 MG capsule Take 50 mg by mouth at bedtime. Take 1 capsule by mouth at bedtime   QUEtiapine (SEROQUEL) 25 MG tablet Take 25 mg by mouth at bedtime. Take 1-2 tablets for sleep   terbinafine  (LAMISIL ) 250 MG tablet Take 1 tablet (250 mg total) by mouth daily.   torsemide  (DEMADEX ) 20 MG tablet Take 40 mg by mouth daily.   [DISCONTINUED] furosemide  (LASIX ) 40 MG  tablet Take by mouth.     Allergies:   Keppra  [levetiracetam ] and Duloxetine    Social History   Socioeconomic History   Marital status: Married    Spouse name: Not on file   Number of children: Not on file   Years of education: Not on file   Highest education level: Not on file  Occupational History   Not on file  Tobacco Use   Smoking status: Never   Smokeless tobacco: Never  Vaping Use   Vaping status: Never Used  Substance and Sexual Activity   Alcohol use: No   Drug use: No   Sexual activity: Yes  Other Topics Concern   Not on file  Social History Narrative   Not on file   Social Drivers of Health   Financial Resource Strain: Not on file  Food Insecurity: Not on file  Transportation Needs: Not on file  Physical Activity: Not on file  Stress: Not on file  Social Connections: Not on file     Family History: The patient's family history includes Cancer in her mother; Congestive Heart Failure in her father; Diabetes in her father and mother; Hypertension in her mother.  ROS:   Please see the history of present illness.     All other systems reviewed and are negative.  EKGs/Labs/Other Studies Reviewed:    The following studies were reviewed today:  EKG Interpretation Date/Time:  Friday Oct 07 2023 08:30:08 EDT Ventricular Rate:  92 PR Interval:  138 QRS Duration:  84 QT Interval:  364 QTC Calculation: 450 R Axis:   -4  Text Interpretation: Normal sinus rhythm Minimal voltage criteria for LVH, may be normal variant ( R in aVL ) Inferior infarct , age undetermined Confirmed by Constancia Delton ([id number]) on 10/07/2023 8:32:18 AM    Recent Labs: 03/17/2023: Hemoglobin 12.3; Platelets 294 09/12/2023: BUN 13; Creatinine, Ser 1.05; Potassium 4.8; Sodium 139  Recent Lipid Panel    Component Value Date/Time   CHOL 194 08/16/2023 0806   CHOL 158 06/28/2012 0746   TRIG 135 08/16/2023 0806   TRIG 125 06/28/2012 0746   HDL 39 (L) 08/16/2023 0806   HDL 33 (L)  06/28/2012 0746   CHOLHDL 5.0 08/16/2023 0806   VLDL 27 08/16/2023 0806   VLDL 25 06/28/2012 0746   LDLCALC 128 (H) 08/16/2023 0806   LDLCALC 100 06/28/2012 0746     Risk Assessment/Calculations:             Physical Exam:    VS:  BP 106/72   Pulse 92   Ht 5\' 2"  (1.575 m)   Wt 271 lb 6.4 oz (123.1 kg)   LMP 05/28/2015   SpO2 98%   BMI 49.64 kg/m     Wt Readings from Last 3 Encounters:  10/07/23 271 lb 6.4 oz (123.1 kg)  09/21/23 271 lb 9.6 oz (123.2 kg)  09/14/23 271 lb 9.6 oz (123.2 kg)     GEN:  Well nourished, well developed in no acute distress HEENT: Normal NECK: No JVD; No carotid bruits CARDIAC: RRR, no murmurs, rubs, gallops RESPIRATORY:  Clear to auscultation without rales, wheezing or rhonchi  ABDOMEN: Soft, non-tender, non-distended  MUSCULOSKELETAL:  trace edema; No deformity  SKIN: Warm and dry NEUROLOGIC:  Alert and oriented x 3 PSYCHIATRIC:  Normal affect   ASSESSMENT:    1. Heart failure with preserved ejection fraction, unspecified HF chronicity (HCC)   2. Morbid obesity (HCC)   3. Primary hypertension     PLAN:    In order of problems listed above:  HFpEF, EF 60 to 65%, impaired relaxation, trace edema.  Dyspnea driven by morbid obesity.  Volume status adequate.  Continue torsemide  40 mg daily.   Morbid obesity, low-calorie diet, weight loss advised.  Advised to cut back on carbs, sweets, sodas. Hypertension, BP controlled.  Continue irbesartan  to 75 mg daily, continue Toprol -XL 75 mg daily.  Follow-up in 6 months.     Medication Adjustments/Labs and Tests Ordered: Current medicines are reviewed at length with the patient today.  Concerns regarding medicines are outlined above.  Orders Placed This Encounter  Procedures   EKG 12-Lead   No orders of the defined types were placed in this encounter.   Patient Instructions  Medication Instructions:  Your Physician recommend you continue on your current medication as directed.    *If  you need a refill on your cardiac medications before your next appointment, please call your pharmacy*  Lab Work: No  labs ordered today  If you have labs (blood work) drawn today and your tests are completely normal, you will receive your results only by: MyChart Message (if you have MyChart) OR A paper copy in the mail If you have any lab test that is abnormal or we need to change your treatment, we will call you to review the results.  Testing/Procedures: No test ordered today   Follow-Up: At [facility], you and your health needs are our priority.  As part of our continuing mission to provide you with exceptional heart care, our providers are all part of one team.  This team includes your primary Cardiologist (physician) and Advanced Practice Providers or APPs (Physician Assistants and Nurse Practitioners) who all work together to provide you with the care you need, when you need it.  Your next appointment:   6 month(s)  Provider:   You may see Constancia Delton, MD or one of the following Advanced Practice Providers on your designated Care Team:   Laneta Pintos, NP Gildardo Labrador, PA-C Varney Gentleman, PA-C Cadence [place], PA-C Ronald Cockayne, NP Morey Ar, NP    We recommend signing up for the patient portal called "MyChart".  Sign up information is provided on this After Visit Summary.  MyChart is used to connect with patients for Virtual Visits (Telemedicine).  Patients are able to view lab/test results, encounter notes, upcoming appointments, etc.  Non-urgent messages can be sent to your provider as well.   To learn more about what you can do with MyChart, go to [email or web address].     Signed, Constancia Delton, MD  10/07/2023 10:02 AM    [facility]

## 2023-10-07 NOTE — Patient Instructions (Signed)
 Medication Instructions:  Your Physician recommend you continue on your current medication as directed.    *If you need a refill on your cardiac medications before your next appointment, please call your pharmacy*  Lab Work: No labs ordered today  If you have labs (blood work) drawn today and your tests are completely normal, you will receive your results only by: MyChart Message (if you have MyChart) OR A paper copy in the mail If you have any lab test that is abnormal or we need to change your treatment, we will call you to review the results.  Testing/Procedures: No test ordered today   Follow-Up: At [facility], you and your health needs are our priority.  As part of our continuing mission to provide you with exceptional heart care, our providers are all part of one team.  This team includes your primary Cardiologist (physician) and Advanced Practice Providers or APPs (Physician Assistants and Nurse Practitioners) who all work together to provide you with the care you need, when you need it.  Your next appointment:   6 month(s)  Provider:   You may see Constancia Delton, MD or one of the following Advanced Practice Providers on your designated Care Team:   Laneta Pintos, NP Gildardo Labrador, PA-C Varney Gentleman, PA-C Cadence [place], PA-C Ronald Cockayne, NP Morey Ar, NP    We recommend signing up for the patient portal called "MyChart".  Sign up information is provided on this After Visit Summary.  MyChart is used to connect with patients for Virtual Visits (Telemedicine).  Patients are able to view lab/test results, encounter notes, upcoming appointments, etc.  Non-urgent messages can be sent to your provider as well.   To learn more about what you can do with MyChart, go to [email or web address].

## 2023-10-11 ENCOUNTER — Encounter: Payer: Self-pay | Admitting: Gastroenterology

## 2023-10-11 ENCOUNTER — Ambulatory Visit (INDEPENDENT_AMBULATORY_CARE_PROVIDER_SITE_OTHER): Admitting: Gastroenterology

## 2023-10-11 VITALS — BP 108/73 | HR 76 | Temp 98.6°F | Ht 62.0 in | Wt 269.4 lb

## 2023-10-11 DIAGNOSIS — K649 Unspecified hemorrhoids: Secondary | ICD-10-CM

## 2023-10-11 DIAGNOSIS — K648 Other hemorrhoids: Secondary | ICD-10-CM

## 2023-10-11 NOTE — Patient Instructions (Addendum)
 Please call [facility] and schedule an appointment with Dr. Antony Baumgartner at [phone] for your first hemorrhoid banding.  Nonsurgical Procedures for Hemorrhoids Nonsurgical procedures can be used to treat hemorrhoids. Hemorrhoids are swollen veins in and around the rectum or the opening of the butt (anus). There are two types: Internal. These occur in the veins just inside the rectum. They may poke through to the outside. External. These occur in the veins outside the anus. They can be felt as a swelling or hard lump near the anus. Hemorrhoids can cause bleeding and pain. They can often be treated at home. If home treatments do not help, you may need to have a procedure done. Nonsurgical procedures include rubber band ligation, sclerotherapy, and infrared coagulation. Tell a health care provider about: Any allergies you have. All medicines you are taking, including vitamins, herbs, eye drops, creams, and over-the-counter medicines. Any problems you or family members have had with anesthesia. Any bleeding problems you have. Any surgeries you have had. Any medical conditions you have. Whether you are pregnant or may be pregnant. What are the risks? Your health care provider will talk with you about risks. These may include: Infection. Bleeding. Pain. What happens before the procedure? Medicines Ask your provider about: Changing or stopping your regular medicines. These include any diabetes medicines or blood thinners you take. Taking medicines such as aspirin and ibuprofen. These medicines can thin your blood. Do not take them unless your provider tells you to. Taking over-the-counter medicines, vitamins, herbs, and supplements. General instructions Follow instructions from your provider about what you may eat and drink. You may need to have a colonoscopy. This is when your provider looks inside your colon using a tube with a camera on the end. This may be done to make sure there are no  other causes for your bleeding or pain. What happens during the procedure?  Your rectal area will be cleaned. A jelly may be put into your rectum. It may contain a medicine to numb the area and keep you from feeling pain (anesthesia). A short tube (anoscope) will be put into your rectum to look at the hemorrhoids. If you have rubber band ligation done: Tools will be used to put rubber bands around the base of the hemorrhoids. This will cut off their blood supply. The hemorrhoids will fall off after a few days. If you have sclerotherapy done: Medicine will be injected into the hemorrhoids. This will cause the hemorrhoids to shrink and dry up. If you have infrared coagulation done: A type of light will be shined on the hemorrhoids. The light will make energy (infrared radiation). This will cause the hemorrhoids to scar and then fall off. Each of these procedures may vary among providers and hospitals. What happens after the procedure? You will be watched to make sure that you have no bleeding. Return to your normal activities as told by your provider. Ask your provider what activities are safe for you. This information is not intended to replace advice given to you by your health care provider. Make sure you discuss any questions you have with your health care provider. Document Revised: 01/08/2022 Document Reviewed: 01/08/2022 Elsevier Patient Education  2024 [employer].

## 2023-10-11 NOTE — Progress Notes (Signed)
 Luke Salaam MD, MRCP(U.K) [address]  [address]  [place], [place] [address]  Main: [phone]  Fax: [phone]   Primary Care Physician: Laurence Pons, NP  Primary Gastroenterologist:  Dr. Luke Salaam   Chief Complaint  Patient presents with   Follow-up    Rectal bleed-hemorrhoid banding discussion    HPI: [name] is a 53 y.o. female   Summary of history :  H/o rectal bleeding with colonoscopy in 09/2020 showing internal hemorroids . Treated with conservative measures.    Seen by Brigitte Canard in July 2024 treated conservatively for her internal hemorrhoids.   03/17/2023 hemoglobin 12.3 g iron studies normal. She says that she has had blood mixed with the stools significant amount.  Denies any NSAID use.  She is says that the shape of her stool also has changed.   Some degree of urgency.  And sensation of incomplete bowel movement  Interval history   06/20/2018 25-5 13 2025  07/07/2023: Flexible sigmoidoscopy with nonbleeding internal hemorrhoids  She states that since the sigmoidoscopy she is doing well occasional bleeding.  We discussed about the process of hemorrhoid banding  Current Outpatient Medications  Medication Sig Dispense Refill   Accu-Chek Softclix Lancets lancets Use 1 lancet to check glucose three times daily as needed for diabetes 100 each 12   albuterol  (PROVENTIL ) (2.5 MG/3ML) 0.083% nebulizer solution USE 1 VIAL IN NEBULIZER EVERY 4 HOURS AS NEEDED FOR WHEEZING AND FOR SHORTNESS OF BREATH (MIX WITH IPRATROPIUM) 90 mL 0   albuterol  (VENTOLIN  HFA) 108 (90 Base) MCG/ACT inhaler Inhale 1-2 puffs into the lungs every 6 (six) hours as needed for wheezing or shortness of breath. 8 g 5   Blood Glucose Monitoring Suppl (ACCU-CHEK GUIDE ME) w/Device KIT      clonazePAM (KLONOPIN) 0.5 MG tablet Take 0.5 mg by mouth 2 (two) times daily as needed for anxiety. Take 1/2 tablet by mouth twice a day     clotrimazole -betamethasone  (LOTRISONE ) cream  Apply 1 Application topically daily. To affected areas of right foot until resolved. 45 g 2   gabapentin (NEURONTIN) 300 MG capsule Take 300 mg by mouth daily. Take 1 to 2 capsules by mouth nightly for sleep     glucose blood (ACCU-CHEK GUIDE TEST) test strip Use 1 test strip three times daily as needed to check glucose for type 2 diabetes E11.65 100 each 12   HYDROcodone -acetaminophen  (NORCO/VICODIN) 5-325 MG tablet Take 1-2 tablets by mouth every 6 (six) hours as needed for moderate pain (pain score 4-6). 30 tablet 0   ipratropium (ATROVENT ) 0.02 % nebulizer solution TAKE 2.5MLS BY NEBULIZATION FOUR TIMES DAILY WITH ALBUTEROL  90 mL 0   irbesartan  (AVAPRO ) 75 MG tablet Take 1 tablet (75 mg total) by mouth daily. 90 tablet 3   lamoTRIgine (LAMICTAL) 200 MG tablet Take 200 mg in the am, and 300 mg at night     metoprolol  succinate (TOPROL -XL) 50 MG 24 hr tablet TAKE 1 & 1/2 (ONE & ONE-HALF) TABLETS BY MOUTH ONCE DAILY 45 tablet 1   nortriptyline (PAMELOR) 10 MG capsule Take 10 mg by mouth. Take 1 tablet po  in the morning     nortriptyline (PAMELOR) 50 MG capsule Take 50 mg by mouth at bedtime. Take 1 capsule by mouth at bedtime     QUEtiapine (SEROQUEL) 25 MG tablet Take 25 mg by mouth at bedtime. Take 1-2 tablets for sleep     terbinafine  (LAMISIL ) 250 MG tablet Take 1 tablet (250  mg total) by mouth daily. 90 tablet 0   torsemide  (DEMADEX ) 20 MG tablet Take 40 mg by mouth daily.     Efinaconazole  10 % SOLN Apply 1 drop topically daily. 4 mL 11   ketorolac  (ACULAR ) 0.5 % ophthalmic solution INSTILL ONE DROP INTO BOTH EYES FOUR TIMES DAILY FOR 7 DAYS. THEN TWICE DAILY FOR 14 DAYS. THEN ONCE DAILY FOR 7 DAYS     No current facility-administered medications for this visit.    Allergies as of 10/11/2023 - Review Complete 10/11/2023  Allergen Reaction Noted   Keppra  [levetiracetam ] Other (See Comments) 09/05/2019   Duloxetine   11/05/2019     ROS:  General: Negative for anorexia, weight loss,  fever, chills, fatigue, weakness. ENT: Negative for hoarseness, difficulty swallowing , nasal congestion. CV: Negative for chest pain, angina, palpitations, dyspnea on exertion, peripheral edema.  Respiratory: Negative for dyspnea at rest, dyspnea on exertion, cough, sputum, wheezing.  GI: See history of present illness. GU:  Negative for dysuria, hematuria, urinary incontinence, urinary frequency, nocturnal urination.  Endo: Negative for unusual weight change.    Physical Examination:   BP 108/73   Pulse 76   Temp 98.6 F (37 C)   Ht 5\' 2"  (1.575 m)   Wt 269 lb 6.4 oz (122.2 kg)   LMP 05/28/2015   BMI 49.27 kg/m   General: Well-nourished, well-developed in no acute distress.  Eyes: No icterus. Conjunctivae pink. Neuro: Alert and oriented x 3.  Grossly intact. Skin: Warm and dry, no jaundice.   Psych: Alert and cooperative, normal mood and affect.   Imaging Studies: No results found.  Assessment and Plan:   [name] is a 53 y.o. y/o female here for internal hemorrhoids.  Recent sigmoidoscopy confirmed the same.  She has responded to conservative management she will continue to do the same if it fails she will come to see me for hemorrhoidal banding at [facility] clinic she has been provided the number    Dr Luke Salaam  MD,MRCP [facility]) Follow up in as needed

## 2023-10-27 ENCOUNTER — Emergency Department
Admission: EM | Admit: 2023-10-27 | Discharge: 2023-10-27 | Disposition: A | Discharge status: Home / Self Care | Attending: Emergency Medicine | Admitting: Emergency Medicine

## 2023-10-27 ENCOUNTER — Other Ambulatory Visit: Payer: Self-pay

## 2023-10-27 DIAGNOSIS — R519 Headache, unspecified: Secondary | ICD-10-CM | POA: Diagnosis present

## 2023-10-27 DIAGNOSIS — R569 Unspecified convulsions: Secondary | ICD-10-CM

## 2023-10-27 DIAGNOSIS — R258 Other abnormal involuntary movements: Secondary | ICD-10-CM | POA: Diagnosis not present

## 2023-10-27 DIAGNOSIS — G8929 Other chronic pain: Secondary | ICD-10-CM

## 2023-10-27 LAB — URINALYSIS, ROUTINE W REFLEX MICROSCOPIC
Bilirubin Urine: NEGATIVE
Glucose, UA: NEGATIVE mg/dL
Hgb urine dipstick: NEGATIVE
Ketones, ur: NEGATIVE mg/dL
Leukocytes,Ua: NEGATIVE
Nitrite: NEGATIVE
Protein, ur: NEGATIVE mg/dL
Specific Gravity, Urine: 1.009 (ref 1.005–1.030)
pH: 7 (ref 5.0–8.0)

## 2023-10-27 LAB — BASIC METABOLIC PANEL WITH GFR
Anion gap: 8 (ref 5–15)
BUN: 12 mg/dL (ref 6–20)
CO2: 31 mmol/L (ref 22–32)
Calcium: 9 mg/dL (ref 8.9–10.3)
Chloride: 99 mmol/L (ref 98–111)
Creatinine, Ser: 0.97 mg/dL (ref 0.44–1.00)
GFR, Estimated: >60 mL/min (ref >60)
Glucose, Bld: 103 mg/dL — ABNORMAL HIGH (ref 70–99)
Potassium: 3.3 mmol/L — ABNORMAL LOW (ref 3.5–5.1)
Sodium: 138 mmol/L (ref 135–145)

## 2023-10-27 LAB — CBC
HCT: 37.3 % (ref 36.0–46.0)
Hemoglobin: 12.7 g/dL (ref 12.0–15.0)
MCH: 30.5 pg (ref 26.0–34.0)
MCHC: 34 g/dL (ref 30.0–36.0)
MCV: 89.4 fL (ref 80.0–100.0)
Platelets: 288 10*3/uL (ref 150–400)
RBC: 4.17 MIL/uL (ref 3.87–5.11)
RDW: 12.7 % (ref 11.5–15.5)
WBC: 5.8 10*3/uL (ref 4.0–10.5)
nRBC: 0 % (ref 0.0–0.2)

## 2023-10-27 MED ORDER — KETOROLAC TROMETHAMINE 30 MG/ML IJ SOLN: 30.0000 mg | Freq: Once | INTRAMUSCULAR | Status: DC

## 2023-10-27 MED ORDER — LORAZEPAM 2 MG/ML IJ SOLN
INTRAMUSCULAR | Status: AC
  Filled 2023-10-27: qty 1

## 2023-10-27 MED ORDER — ACETAMINOPHEN 500 MG PO TABS
1000.0000 mg | ORAL_TABLET | Freq: Once | ORAL | Status: AC
  Administered 2023-10-27: 1000 mg via ORAL
  Filled 2023-10-27: qty 2

## 2023-10-27 MED ORDER — METOCLOPRAMIDE HCL 5 MG/ML IJ SOLN
10.0000 mg | Freq: Once | INTRAMUSCULAR | Status: AC
  Administered 2023-10-27: 10 mg via INTRAVENOUS
  Filled 2023-10-27: qty 2

## 2023-10-27 MED ORDER — SODIUM CHLORIDE 0.9 % IV BOLUS
1000.0000 mL | Freq: Once | INTRAVENOUS | Status: AC
  Administered 2023-10-27: 1000 mL via INTRAVENOUS

## 2023-10-27 MED ORDER — DIPHENHYDRAMINE HCL 50 MG/ML IJ SOLN
25.0000 mg | Freq: Once | INTRAMUSCULAR | Status: AC
  Administered 2023-10-27: 25 mg via INTRAVENOUS
  Filled 2023-10-27: qty 1

## 2023-10-27 MED ORDER — DEXAMETHASONE SODIUM PHOSPHATE 10 MG/ML IJ SOLN
10.0000 mg | Freq: Once | INTRAMUSCULAR | Status: AC
  Administered 2023-10-27: 10 mg via INTRAVENOUS
  Filled 2023-10-27: qty 1

## 2023-10-27 NOTE — ED Provider Notes (Signed)
 [facility] Provider Note   Event Date/Time   First MD Initiated Contact with Patient 10/27/23 1207     (approximate) History  Headache  HPI [name] is a 53 y.o. female with a stated past medical history of epilepsy and chronic migraines who presents from home complaining of headache that started yesterday and has been worsening this morning.  Patient states that she took her normal medications for headache that she has been prescribed by neurology however she feels that her vision is blurry and her balance has been off.  Patient denies any improvement after these medications.  Prior to being roomed, patient had an episode of shaking activity that was presumed to be seizure-like however patient did not have any postictal symptoms.  Patient continues to complain of generalized headache ROS: Patient currently denies any vision changes, tinnitus, difficulty speaking, facial droop, sore throat, chest pain, shortness of breath, abdominal pain, nausea/vomiting/diarrhea, dysuria, or weakness/numbness/paresthesias in any extremity   Physical Exam  Triage Vital Signs: ED Triage Vitals  Encounter Vitals Group     BP 10/27/23 1139 120/83     Systolic BP Percentile --      Diastolic BP Percentile --      Pulse Rate 10/27/23 1137 93     Resp 10/27/23 1137 20     Temp 10/27/23 1137 97.8 F (36.6 C)     Temp Source 10/27/23 1137 Oral     SpO2 10/27/23 1143 100 %     Weight --      Height --      Head Circumference --      Peak Flow --      Pain Score 10/27/23 1138 8     Pain Loc --      Pain Education --      Exclude from Growth Chart --    Most recent vital signs: Vitals:   10/27/23 1143 10/27/23 1300  BP:  109/76  Pulse:  88  Resp:  20  Temp:    SpO2: 100% 100%   General: Awake, oriented x4. CV:  Good peripheral perfusion. Resp:  Normal effort. Abd:  No distention. Other:  Middle-aged obese African-American female resting comfortably in no acute  distress ED Results / Procedures / Treatments  Labs (all labs ordered are listed, but only abnormal results are displayed) Labs Reviewed  BASIC METABOLIC PANEL WITH GFR - Abnormal; Notable for the following components:      Result Value   Potassium 3.3 (*)    Glucose, Bld 103 (*)    All other components within normal limits  URINALYSIS, ROUTINE W REFLEX MICROSCOPIC - Abnormal; Notable for the following components:   Color, Urine YELLOW (*)    APPearance CLEAR (*)    All other components within normal limits  CBC   EKG ED ECG REPORT I, Charleen Conn, the attending physician, personally viewed and interpreted this ECG. Date: 10/27/2023 EKG Time: 1144 Rate: 88 Rhythm: normal sinus rhythm QRS Axis: normal Intervals: normal ST/T Wave abnormalities: normal Narrative Interpretation: no evidence of acute ischemia PROCEDURES: Critical Care performed: No Procedures MEDICATIONS ORDERED IN ED: Medications  diphenhydrAMINE  (BENADRYL ) injection 25 mg (25 mg Intravenous Given 10/27/23 1349)  metoCLOPramide  (REGLAN ) injection 10 mg (10 mg Intravenous Given 10/27/23 1349)  dexamethasone  (DECADRON ) injection 10 mg (10 mg Intravenous Given 10/27/23 1348)  sodium chloride  0.9 % bolus 1,000 mL (0 mLs Intravenous Stopped 10/27/23 1454)  acetaminophen  (TYLENOL ) tablet 1,000 mg (1,000 mg Oral Given  10/27/23 1350)   IMPRESSION / MDM / ASSESSMENT AND PLAN / ED COURSE  I reviewed the triage vital signs and the nursing notes.                             The patient is on the cardiac monitor to evaluate for evidence of arrhythmia and/or significant heart rate changes. Patient's presentation is most consistent with acute presentation with potential threat to life or bodily function. This patient presents with a headache most consistent with benign headache from either tension type headache vs migraine. No headache red flags. Neurologic exam without evidence of meningismus, AMS, focal neurologic findings so  doubt meningitis, encephalitis, stroke. Presentation not consistent with acute intracranial bleed to include SAH (lack of risk factors, headache history). No history of trauma so doubt ICH. Given history and physical temporal arteritis unlikely, as is acute angle closure glaucoma. Doubt carotid artery dissection given no focal neuro deficits, no neck trauma or recent neck strain. Patient with no signs of increased intracranial pressure or weight loss and history and physical suggest more benign headache so less likely mass effect in brain from tumor or abscess or idiopathic intracranial hypertension. Pain was controlled with headache cocktail and patient discharged home with PMD follow up.   FINAL CLINICAL IMPRESSION(S) / ED DIAGNOSES   Final diagnoses:  Chronic nonintractable headache, unspecified headache type  Seizure-like activity (HCC)   Rx / DC Orders   ED Discharge Orders     None      Note:  This document was prepared using Dragon voice recognition software and may include unintentional dictation errors.   Gayathri Futrell K, MD 10/28/23 [phone]

## 2023-10-27 NOTE — ED Notes (Signed)
 EDT was walking with pt and family and pt leaned on front desk and had witnessed seizure like activity lasting approximately 30-45secs. Pt taken to room 3. On arrival to room pt more alert. Once in bed and EDP at bedside pt answering questions.

## 2023-10-27 NOTE — ED Triage Notes (Addendum)
 Pt to ED via POV from home. Pt ambulatory to triage. Pt reports HA that started yesterday and was worse this morning. Pt reports is on medication for HA but has not had any relief. Pt reports intermittent blurry vision and feeling off balance since yesterday.   Rn unable to obtain blood in triage. Lab called and pt placed in triage middle

## 2023-11-01 LAB — HM DIABETES EYE EXAM

## 2023-11-03 ENCOUNTER — Encounter: Payer: Self-pay | Admitting: Nurse Practitioner

## 2023-11-03 ENCOUNTER — Ambulatory Visit (INDEPENDENT_AMBULATORY_CARE_PROVIDER_SITE_OTHER): Admitting: Nurse Practitioner

## 2023-11-03 VITALS — BP 116/88 | HR 86 | Temp 97.6°F | Resp 16 | Ht 62.0 in | Wt 270.0 lb

## 2023-11-03 DIAGNOSIS — R062 Wheezing: Secondary | ICD-10-CM

## 2023-11-03 DIAGNOSIS — R051 Acute cough: Secondary | ICD-10-CM

## 2023-11-03 DIAGNOSIS — J0100 Acute maxillary sinusitis, unspecified: Secondary | ICD-10-CM

## 2023-11-03 MED ORDER — HYDROCOD POLI-CHLORPHE POLI ER 10-8 MG/5ML PO SUER: 5.0000 mL | Freq: Two times a day (BID) | ORAL | 0 refills | Status: DC | PRN

## 2023-11-03 MED ORDER — AMOXICILLIN-POT CLAVULANATE 875-125 MG PO TABS: 1.0000 | ORAL_TABLET | Freq: Two times a day (BID) | ORAL | 0 refills | Status: AC

## 2023-11-03 MED ORDER — PREDNISONE 10 MG (21) PO TBPK: ORAL_TABLET | ORAL | 0 refills | Status: DC

## 2023-11-03 NOTE — Progress Notes (Signed)
 [facility] [address] [place], [place] [address]  Internal MEDICINE  Office Visit Note  Patient Name: [name]  [id number]  [id number]  Date of Service: 11/03/2023  Chief Complaint  Patient presents with   Acute Visit    Coughing yellow stuff, wheezing.      HPI [name] presents for an acute sick visit for upper respiratory infection --onset of symptoms was about 2 days ago --reports cough, sinus drainage, yellow drainage, SOB, wheezing, chest tightness, headaches, sinus pressure, fatigue, sore throat, hoarseness.  --has been having headaches and is seeing neurology and the coughing is making the headaches worse.  Benzonatate  is not helping       Current Medication:  Outpatient Encounter Medications as of 11/03/2023  Medication Sig   Accu-Chek Softclix Lancets lancets Use 1 lancet to check glucose three times daily as needed for diabetes   albuterol  (PROVENTIL ) (2.5 MG/3ML) 0.083% nebulizer solution USE 1 VIAL IN NEBULIZER EVERY 4 HOURS AS NEEDED FOR WHEEZING AND FOR SHORTNESS OF BREATH (MIX WITH IPRATROPIUM)   albuterol  (VENTOLIN  HFA) 108 (90 Base) MCG/ACT inhaler Inhale 1-2 puffs into the lungs every 6 (six) hours as needed for wheezing or shortness of breath.   amoxicillin -clavulanate (AUGMENTIN ) 875-125 MG tablet Take 1 tablet by mouth 2 (two) times daily for 10 days. Take with food   Blood Glucose Monitoring Suppl (ACCU-CHEK GUIDE ME) w/Device KIT    chlorpheniramine-HYDROcodone  (TUSSIONEX) 10-8 MG/5ML Take 5 mLs by mouth every 12 (twelve) hours as needed.   clonazePAM (KLONOPIN) 0.5 MG tablet Take 0.5 mg by mouth 2 (two) times daily as needed for anxiety. Take 1/2 tablet by mouth twice a day   clotrimazole -betamethasone  (LOTRISONE ) cream Apply 1 Application topically daily. To affected areas of right foot until resolved.   gabapentin (NEURONTIN) 300 MG capsule Take 300 mg by mouth daily. Take 1 to 2 capsules by mouth nightly for sleep   glucose blood  (ACCU-CHEK GUIDE TEST) test strip Use 1 test strip three times daily as needed to check glucose for type 2 diabetes E11.65   HYDROcodone -acetaminophen  (NORCO/VICODIN) 5-325 MG tablet Take 1-2 tablets by mouth every 6 (six) hours as needed for moderate pain (pain score 4-6).   ipratropium (ATROVENT ) 0.02 % nebulizer solution TAKE 2.5MLS BY NEBULIZATION FOUR TIMES DAILY WITH ALBUTEROL    irbesartan  (AVAPRO ) 75 MG tablet Take 1 tablet (75 mg total) by mouth daily.   lamoTRIgine (LAMICTAL) 200 MG tablet Take 200 mg in the am, and 300 mg at night   metoprolol  succinate (TOPROL -XL) 50 MG 24 hr tablet TAKE 1 & 1/2 (ONE & ONE-HALF) TABLETS BY MOUTH ONCE DAILY   nortriptyline (PAMELOR) 10 MG capsule Take 10 mg by mouth. Take 1 tablet po  in the morning   nortriptyline (PAMELOR) 50 MG capsule Take 50 mg by mouth at bedtime. Take 1 capsule by mouth at bedtime   predniSONE  (STERAPRED UNI-PAK 21 TAB) 10 MG (21) TBPK tablet Use as directed for 6 days   QUEtiapine (SEROQUEL) 25 MG tablet Take 25 mg by mouth at bedtime. Take 1-2 tablets for sleep   terbinafine  (LAMISIL ) 250 MG tablet Take 1 tablet (250 mg total) by mouth daily.   torsemide  (DEMADEX ) 20 MG tablet Take 40 mg by mouth daily.   Efinaconazole  10 % SOLN Apply 1 drop topically daily.   ketorolac  (ACULAR ) 0.5 % ophthalmic solution INSTILL ONE DROP INTO BOTH EYES FOUR TIMES DAILY FOR 7 DAYS. THEN TWICE DAILY FOR 14 DAYS. THEN ONCE DAILY FOR  7 DAYS   No facility-administered encounter medications on file as of 11/03/2023.      Medical History: Past Medical History:  Diagnosis Date   Allergy    environmental   Anemia    Asthma    Bronchitis, acute    Complication of anesthesia    HAD ASTHMA ATTACK WHILE UNDER   Gastroesophageal reflux disease without esophagitis 11/02/2017   Grade I diastolic dysfunction    Hypertension    Mild mitral regurgitation by prior echocardiogram    Morbid obesity with body mass index (BMI) of 45.0 to 49.9 in adult  [facility])    Obstructive sleep apnea on CPAP    Seizure (HCC)    None since 01/2023   Sleep apnea    CPAP   Vertigo    10/24     Vital Signs: BP 116/88   Pulse 86   Temp 97.6 F (36.4 C)   Resp 16   Ht 5\' 2"  (1.575 m)   Wt 270 lb (122.5 kg)   LMP 05/28/2015   SpO2 97%   BMI 49.38 kg/m    Review of Systems  Constitutional:  Positive for appetite change and fatigue. Negative for chills and fever.  HENT:  Positive for congestion, ear pain, postnasal drip, rhinorrhea, sinus pressure, sinus pain, sneezing, sore throat and voice change.   Respiratory:  Positive for cough, chest tightness, shortness of breath and wheezing.   Cardiovascular: Negative.  Negative for chest pain and palpitations.  Gastrointestinal: Negative.   Musculoskeletal:  Negative for myalgias.  Neurological:  Positive for headaches.    Physical Exam Vitals reviewed.  Constitutional:      General: She is not in acute distress.    Appearance: Normal appearance. She is obese. She is ill-appearing.  HENT:     Head: Normocephalic and atraumatic.     Right Ear: Tympanic membrane, ear canal and external ear normal.     Left Ear: Tympanic membrane, ear canal and external ear normal.     Nose: Congestion and rhinorrhea present.     Mouth/Throat:     Mouth: Mucous membranes are moist.     Pharynx: Posterior oropharyngeal erythema present.  Eyes:     Pupils: Pupils are equal, round, and reactive to light.  Cardiovascular:     Rate and Rhythm: Normal rate and regular rhythm.     Heart sounds: Normal heart sounds. No murmur heard. Pulmonary:     Effort: Pulmonary effort is normal. No accessory muscle usage or respiratory distress.     Breath sounds: Normal air entry. Examination of the right-upper field reveals wheezing. Examination of the left-upper field reveals wheezing. Examination of the right-middle field reveals wheezing. Examination of the left-middle field reveals wheezing. Examination of the right-lower  field reveals decreased breath sounds. Examination of the left-lower field reveals decreased breath sounds. Decreased breath sounds and wheezing present.  Skin:    General: Skin is warm and dry.     Capillary Refill: Capillary refill takes less than 2 seconds.  Neurological:     Mental Status: She is alert and oriented to person, place, and time.  Psychiatric:        Mood and Affect: Mood normal.        Behavior: Behavior normal.       Assessment/Plan: 1. Acute non-recurrent maxillary sinusitis (Primary) Augmentin  prescribed, take until gone.  - amoxicillin -clavulanate (AUGMENTIN ) 875-125 MG tablet; Take 1 tablet by mouth 2 (two) times daily for 10 days. Take with food  Dispense: 20 tablet; Refill: 0  2. Acute cough Cough medication prescribed, take as needed.  - chlorpheniramine-HYDROcodone  (TUSSIONEX) 10-8 MG/5ML; Take 5 mLs by mouth every 12 (twelve) hours as needed.  Dispense: 140 mL; Refill: 0  3. Wheezing Prednisone  taper prescribed take until gone  - predniSONE  (STERAPRED UNI-PAK 21 TAB) 10 MG (21) TBPK tablet; Use as directed for 6 days  Dispense: 21 tablet; Refill: 0   General Counseling: [name] verbalizes understanding of the findings of todays visit and agrees with plan of treatment. I have discussed any further diagnostic evaluation that may be needed or ordered today. We also reviewed her medications today. she has been encouraged to call the office with any questions or concerns that should arise related to todays visit.    Counseling:    No orders of the defined types were placed in this encounter.   Meds ordered this encounter  Medications   predniSONE  (STERAPRED UNI-PAK 21 TAB) 10 MG (21) TBPK tablet    Sig: Use as directed for 6 days    Dispense:  21 tablet    Refill:  0    Fill script today   amoxicillin -clavulanate (AUGMENTIN ) 875-125 MG tablet    Sig: Take 1 tablet by mouth 2 (two) times daily for 10 days. Take with food    Dispense:  20 tablet     Refill:  0    Fill script today   chlorpheniramine-HYDROcodone  (TUSSIONEX) 10-8 MG/5ML    Sig: Take 5 mLs by mouth every 12 (twelve) hours as needed.    Dispense:  140 mL    Refill:  0    Fill script today    Return if symptoms worsen or fail to improve, for keep regular follow up visit scheduled .  [place] Controlled Substance Database was reviewed by me for overdose risk score (ORS)  Time spent:30 Minutes Time spent with patient included reviewing progress notes, labs, imaging studies, and discussing plan for follow up.   This patient was seen by Laurence Pons, FNP-C in collaboration with Dr. Verneta Gone as a part of collaborative care agreement.  Perle Brickhouse R. Bobbi Burow, MSN, FNP-C Internal Medicine

## 2023-11-09 ENCOUNTER — Other Ambulatory Visit: Payer: Self-pay

## 2023-11-09 MED ORDER — IPRATROPIUM-ALBUTEROL 0.5-2.5 (3) MG/3ML IN SOLN: 3.0000 mL | Freq: Four times a day (QID) | RESPIRATORY_TRACT | 1 refills | Status: DC | PRN

## 2023-11-10 ENCOUNTER — Telehealth: Payer: Self-pay

## 2023-11-15 MED ORDER — ALBUTEROL SULFATE (2.5 MG/3ML) 0.083% IN NEBU: 2.5000 mg | INHALATION_SOLUTION | Freq: Four times a day (QID) | RESPIRATORY_TRACT | 3 refills | Status: AC | PRN

## 2023-11-15 NOTE — Telephone Encounter (Signed)
 Patient notified

## 2023-11-15 NOTE — Telephone Encounter (Signed)
 Only 1 thing left and she went to the ER 2 weeks ago can you send her the albuterol ? And wants to know if she can get a higher dose?

## 2023-11-20 ENCOUNTER — Emergency Department

## 2023-11-20 ENCOUNTER — Emergency Department
Admission: EM | Admit: 2023-11-20 | Discharge: 2023-11-20 | Disposition: A | Discharge status: Home / Self Care | Attending: Emergency Medicine | Admitting: Emergency Medicine

## 2023-11-20 ENCOUNTER — Other Ambulatory Visit: Payer: Self-pay

## 2023-11-20 DIAGNOSIS — W19XXXA Unspecified fall, initial encounter: Secondary | ICD-10-CM

## 2023-11-20 DIAGNOSIS — W06XXXA Fall from bed, initial encounter: Secondary | ICD-10-CM | POA: Insufficient documentation

## 2023-11-20 DIAGNOSIS — E119 Type 2 diabetes mellitus without complications: Secondary | ICD-10-CM | POA: Diagnosis not present

## 2023-11-20 DIAGNOSIS — M25561 Pain in right knee: Secondary | ICD-10-CM | POA: Insufficient documentation

## 2023-11-20 DIAGNOSIS — I10 Essential (primary) hypertension: Secondary | ICD-10-CM | POA: Diagnosis not present

## 2023-11-20 DIAGNOSIS — J45909 Unspecified asthma, uncomplicated: Secondary | ICD-10-CM | POA: Diagnosis not present

## 2023-11-20 NOTE — Discharge Instructions (Addendum)
 You were seen in the emergency department following a fall with right knee pain.  Your x-ray did not show any concerns at this time.  Please use the Ace wrap as needed to help with any swelling, pain.  Please take Tylenol  per bottle instructions for pain.  Please return to the emergency department or follow-up with your orthopedic specialist for any new, worsening, or concerning symptoms.

## 2023-11-20 NOTE — ED Notes (Signed)
 Pt taken to xray and xray will bring pt to flex 42 after.

## 2023-11-20 NOTE — ED Triage Notes (Signed)
 Pt comes with right knee pain. Pt states she fell earlier. Pt states she has knee replacement in past and her knee doesn't bend. Pt states when she fell it bent. Pt denies any pop.

## 2023-11-20 NOTE — ED Provider Notes (Signed)
 [facility] Provider Note    Event Date/Time   First MD Initiated Contact with Patient 11/20/23 1649     (approximate)   History   Knee Pain   HPI [name] is a 53 y.o. female presenting to the emergency department with right knee pain x 1 day.  Patient states she slid out of bed and landed on her bilateral knees.  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  Denies fever, erythema, edema, buckling, any hip or ankle pain, hitting her head, LOC.  She was able to ambulate after the injury. Past medical history includes type 2 diabetes, seizures w/ epilepsy, morbid obesity, hypertension, asthma, OSA. 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  Physical Exam   Triage Vital Signs: ED Triage Vitals  Encounter Vitals Group     BP 11/20/23 1644 117/71     Girls Systolic BP Percentile --      Girls Diastolic BP Percentile --      Boys Systolic BP Percentile --      Boys Diastolic BP Percentile --      Pulse Rate 11/20/23 1644 91     Resp 11/20/23 1644 18     Temp 11/20/23 1644 98 F (36.7 C)     Temp src --      SpO2 11/20/23 1644 98 %     Weight 11/20/23 1642 270 lb (122.5 kg)     Height 11/20/23 1642 5' 2 (1.575 m)     Head Circumference --      Peak Flow --      Pain Score 11/20/23 1642 4     Pain Loc --      Pain Education --      Exclude from Growth Chart --     Most recent vital signs: Vitals:   11/20/23 1644  BP: 117/71  Pulse: 91  Resp: 18  Temp: 98 F (36.7 C)  SpO2: 98%    General: Awake, no distress.  CV:  Good peripheral perfusion.  Resp:  Normal effort.  Abd:  No distention.  Other:  Dorsalis pedis pulses 2+ bilaterally.  She has full hip and ankle range of motion bilaterally.  Decreased flexion noted in the right knee compared to the left.  She has minimal swelling and tender to palpation noted on the medial condyle  of the tibia on the right side.  No signs of any rash or contusion or obvious deformity. No pain with palpation of the left knee.  Sensation intact to bilateral lower extremities.   ED Results / Procedures / Treatments   Labs (all labs ordered are listed, but only abnormal results are displayed) Labs Reviewed - No data to display   EKG     RADIOLOGY  X ray right knee ordered.  Imaging was independently viewed and interpreted by me as well as the radiologist. I agree with the radiologist's report that there are no acute fractures and the right TKA is properly aligned.    PROCEDURES:  Critical Care performed: No  Procedures   MEDICATIONS ORDERED IN ED: Medications - No data to display   IMPRESSION / MDM / ASSESSMENT AND PLAN / ED COURSE  I reviewed the triage vital signs and the nursing notes.  Differential diagnosis includes, but is not limited to, mechanical fall, right knee contusion, right TKA displacement avulsion, abrasion, tibial fracture  Patient's presentation is most consistent with acute complicated illness / injury requiring diagnostic workup.  Patient is a 53 year old female presenting with right knee pain following a fall.  There were concerns whether this fall caused a fracture or change in alignment following the right TKA that this patient had in the past.  X-ray was ordered of the right knee, revealed no acute fracture or misalignment.  Patient was instructed to take Tylenol  at home to help with pain since she is not allowed to take NSAIDs.  She was given an Ace wrap to help with any swelling. Discussed RICE with her and given the opportunity to ask questions; all questions were answered.  All vital signs within normal range.  Emergency department return precautions were discussed with the patient.  Patient is in agreement to the treatment plan.  Patient is stable for discharge.    FINAL CLINICAL IMPRESSION(S) / ED DIAGNOSES    Final diagnoses:  Fall, initial encounter  Acute pain of right knee     Rx / DC Orders   ED Discharge Orders     None        Note:  This document was prepared using Dragon voice recognition software and may include unintentional dictation errors.    Sheron Salm, PA-C 11/20/23 1842    Suzanne Kirsch, MD 11/20/23 [phone]

## 2023-11-21 ENCOUNTER — Other Ambulatory Visit: Payer: Self-pay | Admitting: Nurse Practitioner

## 2023-11-21 DIAGNOSIS — R000 Tachycardia, unspecified: Secondary | ICD-10-CM

## 2023-12-07 ENCOUNTER — Other Ambulatory Visit: Payer: Self-pay | Admitting: Nurse Practitioner

## 2023-12-07 DIAGNOSIS — J0100 Acute maxillary sinusitis, unspecified: Secondary | ICD-10-CM

## 2023-12-14 ENCOUNTER — Ambulatory Visit: Admitting: Nurse Practitioner

## 2023-12-21 ENCOUNTER — Other Ambulatory Visit: Payer: Self-pay | Admitting: Nurse Practitioner

## 2023-12-21 DIAGNOSIS — R000 Tachycardia, unspecified: Secondary | ICD-10-CM

## 2023-12-30 ENCOUNTER — Ambulatory Visit: Admitting: Nurse Practitioner

## 2023-12-30 ENCOUNTER — Ambulatory Visit
Admission: RE | Admit: 2023-12-30 | Discharge: 2023-12-30 | Disposition: A | Discharge status: Home / Self Care | Source: Ambulatory Visit | Attending: Nurse Practitioner | Admitting: Nurse Practitioner

## 2023-12-30 ENCOUNTER — Encounter: Payer: Self-pay | Admitting: Nurse Practitioner

## 2023-12-30 ENCOUNTER — Ambulatory Visit
Admission: RE | Admit: 2023-12-30 | Discharge: 2023-12-30 | Disposition: A | Discharge status: Home / Self Care | Attending: Nurse Practitioner | Admitting: Nurse Practitioner

## 2023-12-30 VITALS — BP 116/77 | HR 79 | Temp 97.8°F | Resp 16 | Ht 62.0 in | Wt 269.0 lb

## 2023-12-30 DIAGNOSIS — M79671 Pain in right foot: Secondary | ICD-10-CM

## 2023-12-30 DIAGNOSIS — E1169 Type 2 diabetes mellitus with other specified complication: Secondary | ICD-10-CM | POA: Diagnosis not present

## 2023-12-30 DIAGNOSIS — I152 Hypertension secondary to endocrine disorders: Secondary | ICD-10-CM | POA: Diagnosis not present

## 2023-12-30 DIAGNOSIS — E1159 Type 2 diabetes mellitus with other circulatory complications: Secondary | ICD-10-CM

## 2023-12-30 DIAGNOSIS — E785 Hyperlipidemia, unspecified: Secondary | ICD-10-CM

## 2023-12-30 LAB — POCT GLYCOSYLATED HEMOGLOBIN (HGB A1C): Hemoglobin A1C: 7.1 % — AB (ref 4.0–5.6)

## 2023-12-30 MED ORDER — METFORMIN HCL ER 500 MG PO TB24: 500.0000 mg | ORAL_TABLET | Freq: Every day | ORAL | 1 refills | Status: DC

## 2023-12-30 NOTE — Progress Notes (Signed)
 [facility] [address] [place], [place] [address]  Internal MEDICINE  Office Visit Note  Patient Name: [name]  [id number]  [id number]  Date of Service: 12/30/2023  Chief Complaint  Patient presents with   Diabetes    HPI Kassi presents for a follow-up visit for diabetes, constipation and right foot pain.  Diabetes -- A1c is elevated at 7.1. we have tried just diet control but the A1c has continued to increase.  Constipation --  Right foot pain after tripping up on her flip flops last week.     Current Medication: Outpatient Encounter Medications as of 12/30/2023  Medication Sig   metFORMIN (GLUCOPHAGE-XR) 500 MG 24 hr tablet Take 1 tablet (500 mg total) by mouth daily with breakfast.   Accu-Chek Softclix Lancets lancets Use 1 lancet to check glucose three times daily as needed for diabetes   albuterol  (PROVENTIL ) (2.5 MG/3ML) 0.083% nebulizer solution Take 3 mLs (2.5 mg total) by nebulization every 6 (six) hours as needed for wheezing or shortness of breath.   albuterol  (VENTOLIN  HFA) 108 (90 Base) MCG/ACT inhaler INHALE 1 TO 2 PUFFS BY MOUTH EVERY 6 HOURS AS NEEDED FOR WHEEZING FOR SHORTNESS OF BREATH   Blood Glucose Monitoring Suppl (ACCU-CHEK GUIDE ME) w/Device KIT    chlorpheniramine-HYDROcodone  (TUSSIONEX) 10-8 MG/5ML Take 5 mLs by mouth every 12 (twelve) hours as needed.   clonazePAM (KLONOPIN) 0.5 MG tablet Take 0.5 mg by mouth 2 (two) times daily as needed for anxiety. Take 1/2 tablet by mouth twice a day   clotrimazole -betamethasone  (LOTRISONE ) cream Apply 1 Application topically daily. To affected areas of right foot until resolved.   gabapentin (NEURONTIN) 300 MG capsule Take 300 mg by mouth daily. Take 1 to 2 capsules by mouth nightly for sleep   glucose blood (ACCU-CHEK GUIDE TEST) test strip Use 1 test strip three times daily as needed to check glucose for type 2 diabetes E11.65   HYDROcodone -acetaminophen  (NORCO/VICODIN) 5-325 MG tablet Take  1-2 tablets by mouth every 6 (six) hours as needed for moderate pain (pain score 4-6).   irbesartan  (AVAPRO ) 75 MG tablet Take 1 tablet (75 mg total) by mouth daily.   lamoTRIgine (LAMICTAL) 200 MG tablet Take 200 mg in the am, and 300 mg at night   metoprolol  succinate (TOPROL -XL) 50 MG 24 hr tablet TAKE 1 & 1/2 (ONE & ONE-HALF) TABLETS BY MOUTH ONCE DAILY   nortriptyline (PAMELOR) 10 MG capsule Take 10 mg by mouth. Take 1 tablet po  in the morning   nortriptyline (PAMELOR) 50 MG capsule Take 50 mg by mouth at bedtime. Take 1 capsule by mouth at bedtime   QUEtiapine (SEROQUEL) 25 MG tablet Take 25 mg by mouth at bedtime. Take 1-2 tablets for sleep   torsemide  (DEMADEX ) 20 MG tablet Take 40 mg by mouth daily.   No facility-administered encounter medications on file as of 12/30/2023.    Surgical History: Past Surgical History:  Procedure Laterality Date   ABDOMINAL HYSTERECTOMY N/A 06/09/2015   Procedure: HYSTERECTOMY ABDOMINAL with Bilateral Salpingectomy;  Surgeon: Debby JINNY Dinsmore, MD;  Location: [facility] ORS;  Service: Gynecology;  Laterality: N/A;   CARPAL TUNNEL RELEASE Left 07/15/2023   Procedure: CARPAL TUNNEL RELEASE;  Surgeon: Kathlynn Sharper, MD;  Location: [facility];  Service: Orthopedics;  Laterality: Left;   CATARACT EXTRACTION W/PHACO Right 05/17/2023   Procedure: CATARACT EXTRACTION PHACO AND INTRAOCULAR LENS PLACEMENT (IOC) RIGHT MALYUGIN OMIDRIA 9.07 00:47.9;  Surgeon: Enola Feliciano Hugger, MD;  Location: [facility]  [facility];  Service: Ophthalmology;  Laterality: Right;   CATARACT EXTRACTION W/PHACO Left 05/30/2023   Procedure: CATARACT EXTRACTION PHACO AND INTRAOCULAR LENS PLACEMENT (IOC) LEFT [name] 3.73 00:32.0;  Surgeon: Enola Feliciano Hugger, MD;  Location: [facility];  Service: Ophthalmology;  Laterality: Left;   CESAREAN SECTION  [id number]   COLONOSCOPY WITH PROPOFOL  N/A 10/24/2020   Procedure: COLONOSCOPY WITH PROPOFOL ;  Surgeon: Jinny Carmine, MD;   Location: [facility];  Service: Endoscopy;  Laterality: N/A;  sleep apnea   DORSAL COMPARTMENT RELEASE Left 07/15/2023   Procedure: RELEASE DORSAL COMPARTMENT (DEQUERVAIN);  Surgeon: Kathlynn Sharper, MD;  Location: [facility];  Service: Orthopedics;  Laterality: Left;   FINGER SURGERY  1992   FLEXIBLE SIGMOIDOSCOPY N/A 07/07/2023   Procedure: FLEXIBLE SIGMOIDOSCOPY;  Surgeon: Therisa Bi, MD;  Location: [facility] ENDOSCOPY;  Service: Gastroenterology;  Laterality: N/A;  Unsedated per office; patient states she might want sedation   GANGLION CYST EXCISION Left 07/15/2023   Procedure: REMOVAL GANGLION OF WRIST;  Surgeon: Kathlynn Sharper, MD;  Location: [facility];  Service: Orthopedics;  Laterality: Left;   GASTRIC BYPASS     INGUINAL HERNIA REPAIR Right 2010   REPLACEMENT TOTAL KNEE Right     Medical History: Past Medical History:  Diagnosis Date   Allergy    environmental   Anemia    Asthma    Bronchitis, acute    Complication of anesthesia    HAD ASTHMA ATTACK WHILE UNDER   Gastroesophageal reflux disease without esophagitis 11/02/2017   Grade I diastolic dysfunction    Hypertension    Mild mitral regurgitation by prior echocardiogram    Morbid obesity with body mass index (BMI) of 45.0 to 49.9 in adult [facility])    Obstructive sleep apnea on CPAP    Seizure (HCC)    None since 01/2023   Sleep apnea    CPAP   Vertigo    10/24    Family History: Family History  Problem Relation Age of Onset   Diabetes Mother    Hypertension Mother    Cancer Mother    Congestive Heart Failure Father    Diabetes Father     Social History   Socioeconomic History   Marital status: Married    Spouse name: Not on file   Number of children: Not on file   Years of education: Not on file   Highest education level: Not on file  Occupational History   Not on file  Tobacco Use   Smoking status: Never   Smokeless tobacco: Never  Vaping Use   Vaping status: Never Used   Substance and Sexual Activity   Alcohol use: No   Drug use: No   Sexual activity: Yes  Other Topics Concern   Not on file  Social History Narrative   Not on file   Social Drivers of Health   Financial Resource Strain: Not on file  Food Insecurity: Not on file  Transportation Needs: Not on file  Physical Activity: Not on file  Stress: Not on file  Social Connections: Not on file  Intimate Partner Violence: Not on file      Review of Systems  Constitutional:  Positive for fatigue. Negative for chills and unexpected weight change.  HENT:  Negative for congestion, postnasal drip, rhinorrhea, sneezing and sore throat.   Eyes:  Negative for redness.  Respiratory:  Positive for shortness of breath (intermittent). Negative for cough, chest tightness and wheezing.   Cardiovascular: Negative.  Negative for chest pain  and palpitations.  Gastrointestinal: Negative.  Negative for abdominal pain, constipation, diarrhea, nausea and vomiting.  Genitourinary:  Negative for dysuria and frequency.  Musculoskeletal:  Positive for arthralgias. Negative for back pain, joint swelling and neck pain.  Skin:  Negative for rash.  Neurological: Negative.  Negative for tremors and numbness.  Hematological:  Negative for adenopathy. Does not bruise/bleed easily.  Psychiatric/Behavioral:  Negative for behavioral problems (Depression), self-injury, sleep disturbance and suicidal ideas. The patient is not nervous/anxious.     Vital Signs: BP 116/77   Pulse 79   Temp 97.8 F (36.6 C)   Resp 16   Ht 5' 2 (1.575 m)   Wt 269 lb (122 kg)   LMP 05/28/2015   SpO2 99%   BMI 49.20 kg/m    Physical Exam Vitals reviewed.  Constitutional:      General: She is not in acute distress.    Appearance: Normal appearance. She is obese. She is not ill-appearing.  HENT:     Head: Normocephalic and atraumatic.  Eyes:     Pupils: Pupils are equal, round, and reactive to light.  Cardiovascular:     Rate and  Rhythm: Normal rate and regular rhythm.     Heart sounds: Normal heart sounds. No murmur heard. Pulmonary:     Effort: Pulmonary effort is normal. No respiratory distress.     Breath sounds: Normal breath sounds. No wheezing.  Skin:    General: Skin is warm and dry.     Capillary Refill: Capillary refill takes less than 2 seconds.  Neurological:     Mental Status: She is alert and oriented to person, place, and time.  Psychiatric:        Mood and Affect: Mood normal.        Behavior: Behavior normal.        Assessment/Plan: 1. Type 2 diabetes mellitus with other specified complication, without long-term current use of insulin (HCC) (Primary) A1c is elevated at 7.1. start metformin xr 500 mg daily.  - POCT glycosylated hemoglobin (Hb A1C) - metFORMIN (GLUCOPHAGE-XR) 500 MG 24 hr tablet; Take 1 tablet (500 mg total) by mouth daily with breakfast.  Dispense: 90 tablet; Refill: 1  2. Hypertension associated with type 2 diabetes mellitus (HCC) Stable, continue medications as prescribed.   3. Hyperlipidemia associated with type 2 diabetes mellitus (HCC) Not currently on statin therapy, will address next visit.   4. Right foot pain Xray of right foot ordered to rule out fracture - DG Foot Complete Right; Future   General Counseling: Baby verbalizes understanding of the findings of todays visit and agrees with plan of treatment. I have discussed any further diagnostic evaluation that may be needed or ordered today. We also reviewed her medications today. she has been encouraged to call the office with any questions or concerns that should arise related to todays visit.    Orders Placed This Encounter  Procedures   DG Foot Complete Right   POCT glycosylated hemoglobin (Hb A1C)    Meds ordered this encounter  Medications   metFORMIN (GLUCOPHAGE-XR) 500 MG 24 hr tablet    Sig: Take 1 tablet (500 mg total) by mouth daily with breakfast.    Dispense:  90 tablet    Refill:  1     Dx code E11.65    Return in about 1 month (around 01/30/2024) for F/U, eval new med, Donal Lynam PCP.   Total time spent:30 Minutes Time spent includes review of chart, medications, test results, and follow  up plan with the patient.   [place] Controlled Substance Database was reviewed by me.  This patient was seen by Mardy Maxin, FNP-C in collaboration with Dr. Sigrid Bathe as a part of collaborative care agreement.   Hayze Gazda R. Maxin, MSN, FNP-C Internal medicine

## 2024-01-04 ENCOUNTER — Ambulatory Visit (INDEPENDENT_AMBULATORY_CARE_PROVIDER_SITE_OTHER): Admitting: Nurse Practitioner

## 2024-01-04 ENCOUNTER — Encounter: Payer: Self-pay | Admitting: Nurse Practitioner

## 2024-01-04 ENCOUNTER — Ambulatory Visit
Admission: RE | Admit: 2024-01-04 | Discharge: 2024-01-04 | Disposition: A | Discharge status: Home / Self Care | Attending: Nurse Practitioner | Admitting: Nurse Practitioner

## 2024-01-04 ENCOUNTER — Ambulatory Visit
Admission: RE | Admit: 2024-01-04 | Discharge: 2024-01-04 | Disposition: A | Discharge status: Home / Self Care | Source: Ambulatory Visit | Attending: Nurse Practitioner | Admitting: Nurse Practitioner

## 2024-01-04 VITALS — BP 136/88 | HR 100 | Temp 97.8°F | Resp 16 | Ht 62.0 in | Wt 269.0 lb

## 2024-01-04 DIAGNOSIS — J189 Pneumonia, unspecified organism: Secondary | ICD-10-CM | POA: Insufficient documentation

## 2024-01-04 DIAGNOSIS — R0989 Other specified symptoms and signs involving the circulatory and respiratory systems: Secondary | ICD-10-CM | POA: Diagnosis present

## 2024-01-04 DIAGNOSIS — R051 Acute cough: Secondary | ICD-10-CM | POA: Diagnosis not present

## 2024-01-04 MED ORDER — LEVOFLOXACIN 750 MG PO TABS: 750.0000 mg | ORAL_TABLET | Freq: Every day | ORAL | 0 refills | Status: AC

## 2024-01-04 MED ORDER — PREDNISONE 10 MG PO TABS: ORAL_TABLET | ORAL | 0 refills | Status: DC

## 2024-01-04 MED ORDER — HYDROCOD POLI-CHLORPHE POLI ER 10-8 MG/5ML PO SUER: 5.0000 mL | Freq: Two times a day (BID) | ORAL | 0 refills | Status: DC | PRN

## 2024-01-04 NOTE — Progress Notes (Signed)
 [facility] [address] [place], [place] [address]  Internal MEDICINE  Office Visit Note  Patient Name: [name]  [id number]  [id number]  Date of Service: 01/04/2024  Chief Complaint  Patient presents with   Acute Visit    Cough yellow phlem      HPI [name] presents for an acute sick visit for cough with yellow drainage --onset of symptoms was in the past 4 days.  --reports cough, yellow sputum, trouble sleeping due to cough, headache, SOB, wheezing, chest tightness and sore throat.       Current Medication:  Outpatient Encounter Medications as of 01/04/2024  Medication Sig   levofloxacin  (LEVAQUIN ) 750 MG tablet Take 1 tablet (750 mg total) by mouth daily for 7 days. Take with food   predniSONE  (DELTASONE ) 10 MG tablet Take one tab 3 x day for 3 days, then take one tab 2 x a day for 3 days and then take one tab a day for 3 days   Accu-Chek Softclix Lancets lancets Use 1 lancet to check glucose three times daily as needed for diabetes   albuterol  (PROVENTIL ) (2.5 MG/3ML) 0.083% nebulizer solution Take 3 mLs (2.5 mg total) by nebulization every 6 (six) hours as needed for wheezing or shortness of breath.   albuterol  (VENTOLIN  HFA) 108 (90 Base) MCG/ACT inhaler INHALE 1 TO 2 PUFFS BY MOUTH EVERY 6 HOURS AS NEEDED FOR WHEEZING FOR SHORTNESS OF BREATH   Blood Glucose Monitoring Suppl (ACCU-CHEK GUIDE ME) w/Device KIT    chlorpheniramine-HYDROcodone  (TUSSIONEX) 10-8 MG/5ML Take 5 mLs by mouth every 12 (twelve) hours as needed.   clonazePAM (KLONOPIN) 0.5 MG tablet Take 0.5 mg by mouth 2 (two) times daily as needed for anxiety. Take 1/2 tablet by mouth twice a day   clotrimazole -betamethasone  (LOTRISONE ) cream Apply 1 Application topically daily. To affected areas of right foot until resolved.   gabapentin (NEURONTIN) 300 MG capsule Take 300 mg by mouth daily. Take 1 to 2 capsules by mouth nightly for sleep   glucose blood (ACCU-CHEK GUIDE TEST) test strip Use 1  test strip three times daily as needed to check glucose for type 2 diabetes E11.65   HYDROcodone -acetaminophen  (NORCO/VICODIN) 5-325 MG tablet Take 1-2 tablets by mouth every 6 (six) hours as needed for moderate pain (pain score 4-6).   irbesartan  (AVAPRO ) 75 MG tablet Take 1 tablet (75 mg total) by mouth daily.   lamoTRIgine (LAMICTAL) 200 MG tablet Take 200 mg in the am, and 300 mg at night   metFORMIN  (GLUCOPHAGE -XR) 500 MG 24 hr tablet Take 1 tablet (500 mg total) by mouth daily with breakfast.   metoprolol  succinate (TOPROL -XL) 50 MG 24 hr tablet TAKE 1 & 1/2 (ONE & ONE-HALF) TABLETS BY MOUTH ONCE DAILY   nortriptyline (PAMELOR) 10 MG capsule Take 10 mg by mouth. Take 1 tablet po  in the morning   nortriptyline (PAMELOR) 50 MG capsule Take 50 mg by mouth at bedtime. Take 1 capsule by mouth at bedtime   QUEtiapine (SEROQUEL) 25 MG tablet Take 25 mg by mouth at bedtime. Take 1-2 tablets for sleep   torsemide  (DEMADEX ) 20 MG tablet Take 40 mg by mouth daily.   [DISCONTINUED] chlorpheniramine-HYDROcodone  (TUSSIONEX) 10-8 MG/5ML Take 5 mLs by mouth every 12 (twelve) hours as needed.   No facility-administered encounter medications on file as of 01/04/2024.      Medical History: Past Medical History:  Diagnosis Date   Allergy    environmental   Anemia  Asthma    Bronchitis, acute    Complication of anesthesia    HAD ASTHMA ATTACK WHILE UNDER   Gastroesophageal reflux disease without esophagitis 11/02/2017   Grade I diastolic dysfunction    Hypertension    Mild mitral regurgitation by prior echocardiogram    Morbid obesity with body mass index (BMI) of 45.0 to 49.9 in adult [facility])    Obstructive sleep apnea on CPAP    Seizure (HCC)    None since 01/2023   Sleep apnea    CPAP   Vertigo    10/24     Vital Signs: BP 136/88   Pulse 100   Temp 97.8 F (36.6 C)   Resp 16   Ht 5' 2 (1.575 m)   Wt 269 lb (122 kg)   LMP 05/28/2015   SpO2 95%   BMI 49.20 kg/m    Review of  Systems  Constitutional:  Positive for fatigue. Negative for chills and fever.  HENT:  Positive for congestion, postnasal drip, rhinorrhea and sore throat. Negative for sinus pressure, sinus pain and sneezing.   Respiratory:  Positive for cough, chest tightness, shortness of breath and wheezing.   Cardiovascular: Negative.  Negative for chest pain and palpitations.  Gastrointestinal:  Negative for diarrhea, nausea and vomiting.  Neurological:  Positive for headaches (mild).    Physical Exam Vitals reviewed.  Constitutional:      Appearance: Normal appearance.  Pulmonary:     Effort: Pulmonary effort is normal. No accessory muscle usage or respiratory distress.     Breath sounds: Normal air entry. Examination of the right-upper field reveals rales. Examination of the left-upper field reveals rales. Examination of the right-middle field reveals rales. Examination of the left-middle field reveals rales. Examination of the right-lower field reveals rales. Examination of the left-lower field reveals rales. Rales present.  Neurological:     Mental Status: She is alert.       Assessment/Plan: 1. Walking pneumonia (Primary) Chest xray ordered to evaluate for pneumonia. Start levofloxacin  and prednisone  as prescribed.  - DG Chest 2 View; Future - levofloxacin  (LEVAQUIN ) 750 MG tablet; Take 1 tablet (750 mg total) by mouth daily for 7 days. Take with food  Dispense: 7 tablet; Refill: 0 - predniSONE  (DELTASONE ) 10 MG tablet; Take one tab 3 x day for 3 days, then take one tab 2 x a day for 3 days and then take one tab a day for 3 days  Dispense: 18 tablet; Refill: 0  2. Bilateral rales Chest xray ordered to evaluate for pneumonia. Start levofloxacin  and prednisone  as prescribed.  - DG Chest 2 View; Future - levofloxacin  (LEVAQUIN ) 750 MG tablet; Take 1 tablet (750 mg total) by mouth daily for 7 days. Take with food  Dispense: 7 tablet; Refill: 0 - predniSONE  (DELTASONE ) 10 MG tablet; Take one  tab 3 x day for 3 days, then take one tab 2 x a day for 3 days and then take one tab a day for 3 days  Dispense: 18 tablet; Refill: 0  3. Acute cough Cough medication prescribed.  - chlorpheniramine-HYDROcodone  (TUSSIONEX) 10-8 MG/5ML; Take 5 mLs by mouth every 12 (twelve) hours as needed.  Dispense: 140 mL; Refill: 0   General Counseling: Sarena verbalizes understanding of the findings of todays visit and agrees with plan of treatment. I have discussed any further diagnostic evaluation that may be needed or ordered today. We also reviewed her medications today. she has been encouraged to call the office with any questions  or concerns that should arise related to todays visit.    Counseling:    Orders Placed This Encounter  Procedures   DG Chest 2 View    Meds ordered this encounter  Medications   levofloxacin  (LEVAQUIN ) 750 MG tablet    Sig: Take 1 tablet (750 mg total) by mouth daily for 7 days. Take with food    Dispense:  7 tablet    Refill:  0    Fill new script today.   predniSONE  (DELTASONE ) 10 MG tablet    Sig: Take one tab 3 x day for 3 days, then take one tab 2 x a day for 3 days and then take one tab a day for 3 days    Dispense:  18 tablet    Refill:  0    Fill new script today.   chlorpheniramine-HYDROcodone  (TUSSIONEX) 10-8 MG/5ML    Sig: Take 5 mLs by mouth every 12 (twelve) hours as needed.    Dispense:  140 mL    Refill:  0    Fill script today    Return if symptoms worsen or fail to improve, for and keep regular schedule appts. .  [place] Controlled Substance Database was reviewed by me for overdose risk score (ORS)  Time spent:30 Minutes Time spent with patient included reviewing progress notes, labs, imaging studies, and discussing plan for follow up.   This patient was seen by Mardy Maxin, FNP-C in collaboration with Dr. Sigrid Bathe as a part of collaborative care agreement.  Ethelmae Ringel R. Maxin, MSN, FNP-C Internal Medicine

## 2024-01-09 ENCOUNTER — Telehealth: Payer: Self-pay | Admitting: Nurse Practitioner

## 2024-01-09 ENCOUNTER — Telehealth: Payer: Self-pay

## 2024-01-09 NOTE — Telephone Encounter (Signed)
 Pt called that she still wheezing and coughing as per alyssa advised that finished  antibiotic and used nebulizer as needed and try hot tea and soup and also gave her 1 more day note for work

## 2024-01-09 NOTE — Telephone Encounter (Signed)
Emailed work note to patient-[name] 

## 2024-01-18 ENCOUNTER — Emergency Department: Admission: EM | Admit: 2024-01-18 | Discharge: 2024-01-18 | Disposition: A | Discharge status: Home / Self Care

## 2024-01-18 ENCOUNTER — Emergency Department

## 2024-01-18 ENCOUNTER — Other Ambulatory Visit: Payer: Self-pay

## 2024-01-18 DIAGNOSIS — E876 Hypokalemia: Secondary | ICD-10-CM | POA: Diagnosis not present

## 2024-01-18 DIAGNOSIS — J40 Bronchitis, not specified as acute or chronic: Secondary | ICD-10-CM | POA: Diagnosis not present

## 2024-01-18 DIAGNOSIS — E119 Type 2 diabetes mellitus without complications: Secondary | ICD-10-CM | POA: Insufficient documentation

## 2024-01-18 DIAGNOSIS — J45901 Unspecified asthma with (acute) exacerbation: Secondary | ICD-10-CM

## 2024-01-18 DIAGNOSIS — R059 Cough, unspecified: Secondary | ICD-10-CM | POA: Diagnosis present

## 2024-01-18 LAB — CBC
HCT: 37.6 % (ref 36.0–46.0)
Hemoglobin: 12.7 g/dL (ref 12.0–15.0)
MCH: 30.2 pg (ref 26.0–34.0)
MCHC: 33.8 g/dL (ref 30.0–36.0)
MCV: 89.3 fL (ref 80.0–100.0)
Platelets: 272 K/uL (ref 150–400)
RBC: 4.21 MIL/uL (ref 3.87–5.11)
RDW: 12.5 % (ref 11.5–15.5)
WBC: 7.1 K/uL (ref 4.0–10.5)
nRBC: 0 % (ref 0.0–0.2)

## 2024-01-18 LAB — BASIC METABOLIC PANEL WITH GFR
Anion gap: 17 — ABNORMAL HIGH (ref 5–15)
BUN: 16 mg/dL (ref 6–20)
CO2: 26 mmol/L (ref 22–32)
Calcium: 9.1 mg/dL (ref 8.9–10.3)
Chloride: 93 mmol/L — ABNORMAL LOW (ref 98–111)
Creatinine, Ser: 0.97 mg/dL (ref 0.44–1.00)
GFR, Estimated: >60 mL/min (ref >60)
Glucose, Bld: 164 mg/dL — ABNORMAL HIGH (ref 70–99)
Potassium: 2.8 mmol/L — ABNORMAL LOW (ref 3.5–5.1)
Sodium: 136 mmol/L (ref 135–145)

## 2024-01-18 LAB — TROPONIN I (HIGH SENSITIVITY): Troponin I (High Sensitivity): 4 ng/L (ref <18)

## 2024-01-18 MED ORDER — PREDNISONE 20 MG PO TABS: 40.0000 mg | ORAL_TABLET | Freq: Every day | ORAL | 0 refills | Status: AC

## 2024-01-18 MED ORDER — PREDNISONE 20 MG PO TABS
60.0000 mg | ORAL_TABLET | Freq: Once | ORAL | Status: AC
  Administered 2024-01-18: 60 mg via ORAL
  Filled 2024-01-18: qty 3

## 2024-01-18 MED ORDER — GUAIFENESIN 100 MG/5ML PO LIQD: 5.0000 mL | ORAL | 0 refills | Status: DC | PRN

## 2024-01-18 MED ORDER — POTASSIUM CHLORIDE CRYS ER 20 MEQ PO TBCR: 40.0000 meq | EXTENDED_RELEASE_TABLET | Freq: Every day | ORAL | 0 refills | Status: DC

## 2024-01-18 MED ORDER — IPRATROPIUM-ALBUTEROL 0.5-2.5 (3) MG/3ML IN SOLN
3.0000 mL | Freq: Once | RESPIRATORY_TRACT | Status: AC
  Administered 2024-01-18: 3 mL via RESPIRATORY_TRACT
  Filled 2024-01-18: qty 3

## 2024-01-18 MED ORDER — POTASSIUM CHLORIDE 20 MEQ PO PACK
40.0000 meq | PACK | Freq: Once | ORAL | Status: AC
  Administered 2024-01-18: 40 meq via ORAL
  Filled 2024-01-18: qty 2

## 2024-01-18 NOTE — Discharge Instructions (Signed)
 Your evaluation in the emergency department was notable for evidence of an asthma exacerbation, and I suspect you have bronchitis.  I have restarted you on steroids and also prescribed you a stronger cough medication.  Please continue to use your nebulizer machine at home, and return to the emergency department with any new or worsening symptoms.  Of note, your potassium was somewhat low here today as well, and I prescribed you 2 further doses of potassium to take over the next couple days.

## 2024-01-18 NOTE — ED Provider Notes (Signed)
 [facility] Provider Note    Event Date/Time   First MD Initiated Contact with Patient 01/18/24 1744     (approximate)   History   Shortness of Breath  Pt comes in via [facility] from her vehicle in reference to SOB Pt diagnosed with pneumonia 3 weeks ago. Pt was on Abx, but completed them on Monday. Pt received 1 nebulizer treatment with EMS, pt with a dry non productive cough at this time. Pt has complaints of tightness in her chest 5/10. PT with no signs of acute distress at this time.   97.8 121/94 97 RR 26 97% RA    HPI [name] is a 53 y.o. female PMH diabetes, hyperlipidemia, epilepsy, asthma, bronchitis, GERD presents for evaluation of cough - Patient has been having some cough and shortness of breath for about 3 weeks.  Seen clinic on 01/04/2024, see summary below.  Started on antibiotics and steroids at that time for presumed pneumonia and bronchitis. - Presents today because her symptoms have persisted, thought they would have resolved by now.  Notes that she is coughing a lot at night and not able to sleep well.  Does have a home nebulizer machine which she uses regularly.  Steroids and antibiotics are already completed. - No fever - No leg swelling - Does note that she sometimes has discomfort in her chest when she coughs   Per chart review, patient was seen in clinic on 01/04/2024, diagnosed with walking pneumonia at that time, started on levofloxacin  and prednisone .  Chest x-ray at that time unremarkable.     Physical Exam   Triage Vital Signs: ED Triage Vitals  Encounter Vitals Group     BP 01/18/24 1613 126/82     Girls Systolic BP Percentile --      Girls Diastolic BP Percentile --      Boys Systolic BP Percentile --      Boys Diastolic BP Percentile --      Pulse Rate 01/18/24 1613 96     Resp 01/18/24 1613 20     Temp 01/18/24 1613 98.3 F (36.8 C)     Temp src --      SpO2 01/18/24 1613 97 %     Weight 01/18/24 1614 264 lb  (119.7 kg)     Height 01/18/24 1614 5' 2 (1.575 m)     Head Circumference --      Peak Flow --      Pain Score 01/18/24 1613 5     Pain Loc --      Pain Education --      Exclude from Growth Chart --     Most recent vital signs: Vitals:   01/18/24 1613 01/18/24 1742  BP: 126/82   Pulse: 96   Resp: 20   Temp: 98.3 F (36.8 C)   SpO2: 97% 97%     General: Awake, no distress.  CV:  Good peripheral perfusion. RRR, RP 2+.  Lower extremity edema appreciated. Resp:  Normal effort.  Good airflow throughout though index Tory wheezing appreciated in bilateral upper and lower lung fields.  No focal coarse breath sounds.  No crackles.   ED Results / Procedures / Treatments   Labs (all labs ordered are listed, but only abnormal results are displayed) Labs Reviewed  BASIC METABOLIC PANEL WITH GFR - Abnormal; Notable for the following components:      Result Value   Potassium 2.8 (*)    Chloride 93 (*)  Glucose, Bld 164 (*)    Anion gap 17 (*)    All other components within normal limits  CBC  TROPONIN I (HIGH SENSITIVITY)  TROPONIN I (HIGH SENSITIVITY)     EKG  See ED course below   RADIOLOGY Radiology interpreted by myself and radiology report reviewed.  No acute pathology identified.    PROCEDURES:  Critical Care performed: No  Procedures   MEDICATIONS ORDERED IN ED: Medications  potassium chloride  (KLOR-CON ) packet 40 mEq (40 mEq Oral Given 01/18/24 1756)  ipratropium-albuterol  (DUONEB) 0.5-2.5 (3) MG/3ML nebulizer solution 3 mL (3 mLs Nebulization Given 01/18/24 1859)  predniSONE  (DELTASONE ) tablet 60 mg (60 mg Oral Given 01/18/24 1857)  ipratropium-albuterol  (DUONEB) 0.5-2.5 (3) MG/3ML nebulizer solution 3 mL (3 mLs Nebulization Given 01/18/24 1859)     IMPRESSION / MDM / ASSESSMENT AND PLAN / ED COURSE  I reviewed the triage vital signs and the nursing notes.                              DDX/MDM/AP: Differential diagnosis includes, but is not  limited to, asthma exacerbation, bronchitis.  Consider though doubt pneumonia.  Do not clinically suspect CHF, PE, ACS.  Plan: - Labs - EKG - Chest x-ray - DuoNeb - Prednisone    Patient's presentation is most consistent with acute presentation with potential threat to life or bodily function.  The patient is on the cardiac monitor to evaluate for evidence of arrhythmia and/or significant heart rate changes.  ED course below.  Workup reassuring labs including troponin, chest x-ray, EKG.  Was incidentally found to have hypokalemia to 2.8, no associated symptomatology.  Repleted p.o. with 40 meq, will prescribe 2 more doses for use at home.  Possibly secondary to torsemide  though do not believe patient should discontinue that at this time, plan to recheck potassium as outpatient.  Remains satting well on room air with normal work of breathing care.  Treated with 2 nebulizer treatments and restarted on home course of steroids for presumed bronchitis as well as asthma exacerbation.  Also Rx Robitussin for cough.  Plan for PMD follow-up.  ED return precautions in place.  Patient agrees with plan.  Clinical Course as of 01/18/24 1904  Wed Jan 18, 2024  1748 BMP with hypokalemia, will replete p.o. [MM]  1818 CBC reviewed, unremarkable [MM]  1818 Troponin normal [MM]  1818 Chest x-ray reviewed, unremarkable on my interpretation, radiology report in agreement [MM]  1818 Ecg = sinus rhythm, rate 96, no gross ST elevation or depression, no significant repolarization abnormality, left axis deviation, normal intervals.  No clear evidence of arrhythmia or ischemia on my interpretation. [MM]    Clinical Course User Index [MM] Clarine Ozell LABOR, MD     FINAL CLINICAL IMPRESSION(S) / ED DIAGNOSES   Final diagnoses:  Exacerbation of asthma, unspecified asthma severity, unspecified whether persistent  Bronchitis  Hypokalemia     Rx / DC Orders   ED Discharge Orders          Ordered    predniSONE   (DELTASONE ) 20 MG tablet  Daily with breakfast        01/18/24 1856    potassium chloride  SA (KLOR-CON  M) 20 MEQ tablet  Daily        01/18/24 1857    guaiFENesin  (ROBITUSSIN) 100 MG/5ML liquid  Every 4 hours PRN        01/18/24 1856  Note:  This document was prepared using Dragon voice recognition software and may include unintentional dictation errors.   Clarine Ozell LABOR, MD 01/18/24 [phone]

## 2024-01-18 NOTE — ED Triage Notes (Signed)
 Pt comes in via [facility] from her vehicle in reference to SOB Pt diagnosed with pneumonia 3 weeks ago. Pt was on Abx, but completed them on Monday. Pt received 1 nebulizer treatment with EMS, pt with a dry non productive cough at this time. Pt has complaints of tightness in her chest 5/10. PT with no signs of acute distress at this time.   97.8 121/94 97 RR 26 97% RA

## 2024-01-22 ENCOUNTER — Other Ambulatory Visit: Payer: Self-pay | Admitting: Nurse Practitioner

## 2024-01-22 DIAGNOSIS — R000 Tachycardia, unspecified: Secondary | ICD-10-CM

## 2024-01-23 ENCOUNTER — Encounter: Payer: Self-pay | Admitting: Nurse Practitioner

## 2024-01-23 ENCOUNTER — Ambulatory Visit (INDEPENDENT_AMBULATORY_CARE_PROVIDER_SITE_OTHER): Admitting: Nurse Practitioner

## 2024-01-23 VITALS — BP 120/88 | HR 89 | Temp 98.1°F | Resp 16 | Ht 62.0 in | Wt 270.0 lb

## 2024-01-23 DIAGNOSIS — R9431 Abnormal electrocardiogram [ECG] [EKG]: Secondary | ICD-10-CM

## 2024-01-23 DIAGNOSIS — E876 Hypokalemia: Secondary | ICD-10-CM | POA: Diagnosis not present

## 2024-01-23 DIAGNOSIS — R051 Acute cough: Secondary | ICD-10-CM

## 2024-01-23 DIAGNOSIS — R0602 Shortness of breath: Secondary | ICD-10-CM

## 2024-01-23 DIAGNOSIS — J4541 Moderate persistent asthma with (acute) exacerbation: Secondary | ICD-10-CM

## 2024-01-23 DIAGNOSIS — Z9229 Personal history of other drug therapy: Secondary | ICD-10-CM

## 2024-01-23 DIAGNOSIS — R0789 Other chest pain: Secondary | ICD-10-CM

## 2024-01-23 MED ORDER — FLUTICASONE-SALMETEROL 115-21 MCG/ACT IN AERO: 2.0000 | INHALATION_SPRAY | Freq: Two times a day (BID) | RESPIRATORY_TRACT | 12 refills | Status: DC

## 2024-01-23 NOTE — Progress Notes (Signed)
 [facility] [address] [place], [place] [address]  Internal MEDICINE  Office Visit Note  Patient Name: [name]  [id number]  [id number]  Date of Service: 01/23/2024  Chief Complaint  Patient presents with   Gastroesophageal Reflux   Hypertension   Follow-up    Ed-asthma    HPI [name] presents for a follow-up visit for recent ED visit, abnormal EKG, low potassium, asthma and lung infection.  Abnormal EKG, possible inferior infarct, nonspecific T-wave abnormality.  Hypokalemia -- was given IV potassium in the ER for low potassium of 2.8 but the level was not rechecked.  Recent lung infection and asthma exacerbation -- not currently on maintenance inhaler and her last PFT/spiro was done in 2020.  Deep cough -- related to persistent lung infection and asthma     Current Medication: Outpatient Encounter Medications as of 01/23/2024  Medication Sig   fluticasone -salmeterol (ADVAIR HFA) 115-21 MCG/ACT inhaler Inhale 2 puffs into the lungs 2 (two) times daily.   Accu-Chek Softclix Lancets lancets Use 1 lancet to check glucose three times daily as needed for diabetes   albuterol  (PROVENTIL ) (2.5 MG/3ML) 0.083% nebulizer solution Take 3 mLs (2.5 mg total) by nebulization every 6 (six) hours as needed for wheezing or shortness of breath.   albuterol  (VENTOLIN  HFA) 108 (90 Base) MCG/ACT inhaler INHALE 1 TO 2 PUFFS BY MOUTH EVERY 6 HOURS AS NEEDED FOR WHEEZING FOR SHORTNESS OF BREATH   Blood Glucose Monitoring Suppl (ACCU-CHEK GUIDE ME) w/Device KIT    chlorpheniramine-HYDROcodone  (TUSSIONEX) 10-8 MG/5ML Take 5 mLs by mouth every 12 (twelve) hours as needed.   clonazePAM (KLONOPIN) 0.5 MG tablet Take 0.5 mg by mouth 2 (two) times daily as needed for anxiety. Take 1/2 tablet by mouth twice a day   gabapentin (NEURONTIN) 300 MG capsule Take 300 mg by mouth daily. Take 1 to 2 capsules by mouth nightly for sleep   glucose blood (ACCU-CHEK GUIDE TEST) test strip Use 1 test  strip three times daily as needed to check glucose for type 2 diabetes E11.65   guaiFENesin  (ROBITUSSIN) 100 MG/5ML liquid Take 5 mLs by mouth every 4 (four) hours as needed for cough or to loosen phlegm.   irbesartan  (AVAPRO ) 75 MG tablet Take 1 tablet (75 mg total) by mouth daily.   lamoTRIgine (LAMICTAL) 200 MG tablet Take 200 mg in the am, and 300 mg at night   metFORMIN  (GLUCOPHAGE -XR) 500 MG 24 hr tablet Take 1 tablet (500 mg total) by mouth daily with breakfast.   metoprolol  succinate (TOPROL -XL) 50 MG 24 hr tablet TAKE 1 & 1/2 (ONE & ONE-HALF) TABLETS BY MOUTH ONCE DAILY   nortriptyline (PAMELOR) 10 MG capsule Take 10 mg by mouth. Take 1 tablet po  in the morning   nortriptyline (PAMELOR) 50 MG capsule Take 50 mg by mouth at bedtime. Take 1 capsule by mouth at bedtime   potassium chloride  SA (KLOR-CON  M) 20 MEQ tablet Take 2 tablets (40 mEq total) by mouth daily for 2 days.   predniSONE  (DELTASONE ) 10 MG tablet Take one tab 3 x day for 3 days, then take one tab 2 x a day for 3 days and then take one tab a day for 3 days   predniSONE  (DELTASONE ) 20 MG tablet Take 2 tablets (40 mg total) by mouth daily with breakfast for 5 days.   QUEtiapine (SEROQUEL) 25 MG tablet Take 25 mg by mouth at bedtime. Take 1-2 tablets for sleep   torsemide  (DEMADEX ) 20 MG tablet  Take 40 mg by mouth daily.   No facility-administered encounter medications on file as of 01/23/2024.    Surgical History: Past Surgical History:  Procedure Laterality Date   ABDOMINAL HYSTERECTOMY N/A 06/09/2015   Procedure: HYSTERECTOMY ABDOMINAL with Bilateral Salpingectomy;  Surgeon: Jessica Jessica Dinsmore, MD;  Location: [facility];  Service: Gynecology;  Laterality: N/A;   CARPAL TUNNEL RELEASE Left 07/15/2023   Procedure: CARPAL TUNNEL RELEASE;  Surgeon: Jessica Sharper, MD;  Location: [facility];  Service: Orthopedics;  Laterality: Left;   CATARACT EXTRACTION W/PHACO Right 05/17/2023   Procedure: CATARACT EXTRACTION PHACO  AND INTRAOCULAR LENS PLACEMENT (IOC) RIGHT [name] 9.07 00:47.9;  Surgeon: Jessica Feliciano Hugger, MD;  Location: [facility];  Service: Ophthalmology;  Laterality: Right;   CATARACT EXTRACTION W/PHACO Left 05/30/2023   Procedure: CATARACT EXTRACTION PHACO AND INTRAOCULAR LENS PLACEMENT (IOC) LEFT [name] 3.73 00:32.0;  Surgeon: Jessica Feliciano Hugger, MD;  Location: [facility];  Service: Ophthalmology;  Laterality: Left;   CESAREAN SECTION  [phone]   COLONOSCOPY WITH PROPOFOL  N/A 10/24/2020   Procedure: COLONOSCOPY WITH PROPOFOL ;  Surgeon: Jessica Carmine, MD;  Location: [facility];  Service: Endoscopy;  Laterality: N/A;  sleep apnea   DORSAL COMPARTMENT RELEASE Left 07/15/2023   Procedure: RELEASE DORSAL COMPARTMENT (DEQUERVAIN);  Surgeon: Jessica Sharper, MD;  Location: [facility];  Service: Orthopedics;  Laterality: Left;   FINGER SURGERY  1992   FLEXIBLE SIGMOIDOSCOPY N/A 07/07/2023   Procedure: FLEXIBLE SIGMOIDOSCOPY;  Surgeon: Jessica Bi, MD;  Location: [facility];  Service: Gastroenterology;  Laterality: N/A;  Unsedated per office; patient states she might want sedation   GANGLION CYST EXCISION Left 07/15/2023   Procedure: REMOVAL GANGLION OF WRIST;  Surgeon: Jessica Sharper, MD;  Location: [facility];  Service: Orthopedics;  Laterality: Left;   GASTRIC BYPASS     INGUINAL HERNIA REPAIR Right 2010   REPLACEMENT TOTAL KNEE Right     Medical History: Past Medical History:  Diagnosis Date   Allergy    environmental   Anemia    Asthma    Bronchitis, acute    Complication of anesthesia    HAD ASTHMA ATTACK WHILE UNDER   Gastroesophageal reflux disease without esophagitis 11/02/2017   Grade I diastolic dysfunction    Hypertension    Mild mitral regurgitation by prior echocardiogram    Morbid obesity with body mass index (BMI) of 45.0 to 49.9 in adult [facility])    Obstructive sleep apnea on CPAP    Seizure (HCC)    None since 01/2023    Sleep apnea    CPAP   Vertigo    10/24    Family History: Family History  Problem Relation Age of Onset   Diabetes Mother    Hypertension Mother    Cancer Mother    Congestive Heart Failure Father    Diabetes Father     Social History   Socioeconomic History   Marital status: Married    Spouse name: Not on file   Number of children: Not on file   Years of education: Not on file   Highest education level: Not on file  Occupational History   Not on file  Tobacco Use   Smoking status: Never   Smokeless tobacco: Never  Vaping Use   Vaping status: Never Used  Substance and Sexual Activity   Alcohol use: No   Drug use: No   Sexual activity: Yes  Other Topics Concern   Not on file  Social History Narrative  Not on file   Social Drivers of Health   Financial Resource Strain: Not on file  Food Insecurity: Not on file  Transportation Needs: Not on file  Physical Activity: Not on file  Stress: Not on file  Social Connections: Not on file  Intimate Partner Violence: Not on file      Review of Systems  Constitutional:  Positive for fatigue.  Respiratory:  Positive for cough, chest tightness, shortness of breath and wheezing.   Cardiovascular:  Positive for chest pain and palpitations.  Gastrointestinal: Negative.   Musculoskeletal:  Positive for arthralgias.    Vital Signs: BP 120/88   Pulse 89   Temp 98.1 F (36.7 C)   Resp 16   Ht 5' 2 (1.575 m)   Wt 270 lb (122.5 kg)   LMP 05/28/2015   SpO2 97%   BMI 49.38 kg/m    Physical Exam Vitals reviewed.  Constitutional:      General: She is not in acute distress.    Appearance: Normal appearance. She is obese. She is not ill-appearing.  HENT:     Head: Normocephalic and atraumatic.     Right Ear: Tympanic membrane, ear canal and external ear normal.     Left Ear: Tympanic membrane, ear canal and external ear normal.     Nose: Congestion and rhinorrhea present.     Mouth/Throat:     Mouth: Mucous  membranes are moist.     Pharynx: Oropharynx is clear.  Eyes:     Pupils: Pupils are equal, round, and reactive to light.  Cardiovascular:     Rate and Rhythm: Normal rate and regular rhythm.     Heart sounds: Normal heart sounds. No murmur heard. Pulmonary:     Effort: Pulmonary effort is normal. No respiratory distress.     Breath sounds: Wheezing and rales present.  Skin:    General: Skin is warm and dry.     Capillary Refill: Capillary refill takes less than 2 seconds.  Neurological:     Mental Status: She is alert and oriented to person, place, and time.  Psychiatric:        Mood and Affect: Mood normal.        Behavior: Behavior normal.        Thought Content: Thought content normal.        Judgment: Judgment normal.        Assessment/Plan: 1. Abnormal EKG (Primary) Echocardiogram ordered  - ECHOCARDIOGRAM COMPLETE; Future  2. Hypokalemia Repeat BMP lab - Basic Metabolic Panel (BMET)  3. Atypical chest pain Echocardiogram ordered  - ECHOCARDIOGRAM COMPLETE; Future  4. SOB (shortness of breath) Echocardiogram ordered  - ECHOCARDIOGRAM COMPLETE; Future  5. Acute cough Continue prn OTC medication for cough  6. Moderate persistent asthma with acute exacerbation in adult PFT ordered.  Maintenance ICS inhaler prescribed  - fluticasone -salmeterol (ADVAIR HFA) 115-21 MCG/ACT inhaler; Inhale 2 puffs into the lungs 2 (two) times daily.  Dispense: 1 each; Refill: 12 - Pulmonary function test; Future  7. Measles, mumps, rubella (MMR) vaccination up to date Lab ordered to check for MMR immunity  - Measles/Mumps/Rubella Immunity   General Counseling: Hailynn verbalizes understanding of the findings of todays visit and agrees with plan of treatment. I have discussed any further diagnostic evaluation that may be needed or ordered today. We also reviewed her medications today. she has been encouraged to call the office with any questions or concerns that should arise  related to todays visit.  Orders Placed This Encounter  Procedures   Basic Metabolic Panel (BMET)   Measles/Mumps/Rubella Immunity   ECHOCARDIOGRAM COMPLETE   Pulmonary function test    Meds ordered this encounter  Medications   fluticasone -salmeterol (ADVAIR HFA) 115-21 MCG/ACT inhaler    Sig: Inhale 2 puffs into the lungs 2 (two) times daily.    Dispense:  1 each    Refill:  12    Return for push out 9/5 visit for later in september. .   Total time spent:30 Minutes Time spent includes review of chart, medications, test results, and follow up plan with the patient.   [place] Controlled Substance Database was reviewed by me.  This patient was seen by Mardy Maxin, FNP-C in collaboration with Dr. Sigrid Bathe as a part of collaborative care agreement.   Coltan Spinello R. Maxin, MSN, FNP-C Internal medicine

## 2024-01-24 ENCOUNTER — Other Ambulatory Visit
Admission: RE | Admit: 2024-01-24 | Discharge: 2024-01-24 | Disposition: A | Discharge status: Home / Self Care | Attending: Nurse Practitioner | Admitting: Nurse Practitioner

## 2024-01-24 ENCOUNTER — Telehealth: Payer: Self-pay

## 2024-01-24 ENCOUNTER — Telehealth: Payer: Self-pay | Admitting: Nurse Practitioner

## 2024-01-24 ENCOUNTER — Other Ambulatory Visit: Payer: Self-pay | Admitting: Nurse Practitioner

## 2024-01-24 DIAGNOSIS — R9431 Abnormal electrocardiogram [ECG] [EKG]: Secondary | ICD-10-CM | POA: Insufficient documentation

## 2024-01-24 DIAGNOSIS — Z9229 Personal history of other drug therapy: Secondary | ICD-10-CM | POA: Diagnosis not present

## 2024-01-24 DIAGNOSIS — E876 Hypokalemia: Secondary | ICD-10-CM | POA: Insufficient documentation

## 2024-01-24 DIAGNOSIS — R000 Tachycardia, unspecified: Secondary | ICD-10-CM

## 2024-01-24 LAB — BASIC METABOLIC PANEL WITH GFR
Anion gap: 9 (ref 5–15)
BUN: 11 mg/dL (ref 6–20)
CO2: 31 mmol/L (ref 22–32)
Calcium: 8.7 mg/dL — ABNORMAL LOW (ref 8.9–10.3)
Chloride: 96 mmol/L — ABNORMAL LOW (ref 98–111)
Creatinine, Ser: 1 mg/dL (ref 0.44–1.00)
GFR, Estimated: >60 mL/min (ref >60)
Glucose, Bld: 144 mg/dL — ABNORMAL HIGH (ref 70–99)
Potassium: 3.2 mmol/L — ABNORMAL LOW (ref 3.5–5.1)
Sodium: 136 mmol/L (ref 135–145)

## 2024-01-24 NOTE — Telephone Encounter (Signed)
 Updated work note emailed to patient. Scanned-Toni

## 2024-01-25 ENCOUNTER — Ambulatory Visit: Admitting: Podiatry

## 2024-01-25 LAB — MEASLES/MUMPS/RUBELLA IMMUNITY
Mumps IgG: 20.5 [AU]/ml (ref >10.9)
Rubella: 2.65 {index} (ref >0.99)
Rubeola IgG: >300 [AU]/ml (ref >16.4)

## 2024-01-27 ENCOUNTER — Encounter: Payer: Self-pay | Admitting: Nurse Practitioner

## 2024-01-27 MED ORDER — BECLOMETHASONE DIPROP HFA 80 MCG/ACT IN AERB: 2.0000 | INHALATION_SPRAY | Freq: Two times a day (BID) | RESPIRATORY_TRACT | 11 refills | Status: AC

## 2024-01-31 NOTE — Telephone Encounter (Signed)
 Pt notified that [name] inhaler

## 2024-02-01 ENCOUNTER — Ambulatory Visit (INDEPENDENT_AMBULATORY_CARE_PROVIDER_SITE_OTHER): Admitting: Internal Medicine

## 2024-02-01 DIAGNOSIS — J4541 Moderate persistent asthma with (acute) exacerbation: Secondary | ICD-10-CM

## 2024-02-03 ENCOUNTER — Ambulatory Visit: Admitting: Nurse Practitioner

## 2024-02-05 NOTE — Procedures (Signed)
 [facility] [address] [place], [address]    Complete Pulmonary Function Testing Interpretation:  FINDINGS:  The forced vital capacity is mildly decreased.  FEV1 is 1.67 L which is 78% of predicted and is mildly decreased.  Postbronchodilator there was significant improvement in the FEV1 noted.  FEV1 FVC ratio was normal.  Total lung capacity is mild decreased residual volume is normal FRC was mildly decreased.  DLCO is increased.  IMPRESSION:  This pulmonary function studies suggest mild restrictive lung disease with a component of mild obstructive lung disease which is reversible clinical correlation is recommended  Jessica DELENA Bathe, MD [facility] Pulmonary Critical Care Medicine Sleep Medicine

## 2024-02-08 LAB — PULMONARY FUNCTION TEST

## 2024-02-22 ENCOUNTER — Other Ambulatory Visit: Payer: Self-pay

## 2024-02-22 ENCOUNTER — Emergency Department

## 2024-02-22 ENCOUNTER — Encounter: Payer: Self-pay | Admitting: Intensive Care

## 2024-02-22 ENCOUNTER — Emergency Department
Admission: EM | Admit: 2024-02-22 | Discharge: 2024-02-22 | Disposition: A | Discharge status: Home / Self Care | Attending: Emergency Medicine | Admitting: Emergency Medicine

## 2024-02-22 DIAGNOSIS — R002 Palpitations: Secondary | ICD-10-CM | POA: Diagnosis present

## 2024-02-22 DIAGNOSIS — E119 Type 2 diabetes mellitus without complications: Secondary | ICD-10-CM | POA: Insufficient documentation

## 2024-02-22 DIAGNOSIS — I10 Essential (primary) hypertension: Secondary | ICD-10-CM | POA: Diagnosis not present

## 2024-02-22 LAB — CBC
HCT: 36.9 % (ref 36.0–46.0)
Hemoglobin: 12.3 g/dL (ref 12.0–15.0)
MCH: 30.2 pg (ref 26.0–34.0)
MCHC: 33.3 g/dL (ref 30.0–36.0)
MCV: 90.7 fL (ref 80.0–100.0)
Platelets: 307 K/uL (ref 150–400)
RBC: 4.07 MIL/uL (ref 3.87–5.11)
RDW: 12.5 % (ref 11.5–15.5)
WBC: 5.5 K/uL (ref 4.0–10.5)
nRBC: 0 % (ref 0.0–0.2)

## 2024-02-22 LAB — T4, FREE: Free T4: 0.83 ng/dL (ref 0.61–1.12)

## 2024-02-22 LAB — BASIC METABOLIC PANEL WITH GFR
Anion gap: 10 (ref 5–15)
BUN: 11 mg/dL (ref 6–20)
CO2: 31 mmol/L (ref 22–32)
Calcium: 8.9 mg/dL (ref 8.9–10.3)
Chloride: 97 mmol/L — ABNORMAL LOW (ref 98–111)
Creatinine, Ser: 1 mg/dL (ref 0.44–1.00)
GFR, Estimated: >60 mL/min (ref >60)
Glucose, Bld: 100 mg/dL — ABNORMAL HIGH (ref 70–99)
Potassium: 3.4 mmol/L — ABNORMAL LOW (ref 3.5–5.1)
Sodium: 138 mmol/L (ref 135–145)

## 2024-02-22 LAB — TROPONIN I (HIGH SENSITIVITY)
Troponin I (High Sensitivity): 3 ng/L (ref <18)
Troponin I (High Sensitivity): 3 ng/L (ref <18)

## 2024-02-22 LAB — MAGNESIUM: Magnesium: 2.2 mg/dL (ref 1.7–2.4)

## 2024-02-22 LAB — TSH: TSH: 1.498 u[IU]/mL (ref 0.350–4.500)

## 2024-02-22 MED ORDER — POTASSIUM CHLORIDE CRYS ER 20 MEQ PO TBCR
40.0000 meq | EXTENDED_RELEASE_TABLET | Freq: Once | ORAL | Status: AC
  Administered 2024-02-22: 40 meq via ORAL
  Filled 2024-02-22: qty 2

## 2024-02-22 NOTE — Discharge Instructions (Signed)
 You were seen in the ER today for evaluation of your palpitations. Your exam, EKG, and labs were reassuring against an emergency cause for this. Please follow-up with your primary care provider within a few days for reevaluation. Return to the ER for worsening chest pain, difficulty breathing, or any other new or concerning symptoms.

## 2024-02-22 NOTE — ED Triage Notes (Signed)
 Arrived by [facility] from work. Patient reports while sitting at work she felt her heart start beating fast and did not subside. Denies chest pain or dizziness.  EMS vitals: 123/78 b/p 100% RA 84HR  History CHF and asthma

## 2024-02-22 NOTE — ED Notes (Signed)
 I called Lab to draw 2nd trop

## 2024-02-22 NOTE — ED Notes (Signed)
Pt given blankets and water.

## 2024-02-22 NOTE — ED Notes (Signed)
 Pt states that she was at work and seated and started feeling like her heart was racing. Pt states Saturday she had a prickly sensation in her chest

## 2024-02-22 NOTE — ED Provider Notes (Signed)
 [facility] Provider Note    Event Date/Time   First MD Initiated Contact with Patient 02/22/24 1232     (approximate)   History   Palpitations   HPI  [name] is a 53 year old female with history of HTN, T2DM, sinus tachycardia on metoprolol  presenting to the emergency department for evaluation of palpitations.  Patient was at work today when she had onset of palpitations without associated chest pain or shortness of breath.  Currently feels improved.  No associated dizziness.  Reports she has had similar episodes in the past, but it has been a while as these are typically controlled by her metoprolol .  Reports that her metoprolol  dose was increased a few months ago.     Physical Exam   Triage Vital Signs: ED Triage Vitals  Encounter Vitals Group     BP 02/22/24 1204 102/68     Girls Systolic BP Percentile --      Girls Diastolic BP Percentile --      Boys Systolic BP Percentile --      Boys Diastolic BP Percentile --      Pulse Rate 02/22/24 1204 84     Resp 02/22/24 1204 16     Temp 02/22/24 1204 98.4 F (36.9 C)     Temp Source 02/22/24 1204 Oral     SpO2 02/22/24 1204 95 %     Weight 02/22/24 1202 267 lb (121.1 kg)     Height 02/22/24 1202 5' 2 (1.575 m)     Head Circumference --      Peak Flow --      Pain Score 02/22/24 1202 0     Pain Loc --      Pain Education --      Exclude from Growth Chart --     Most recent vital signs: Vitals:   02/22/24 1400 02/22/24 1404  BP: 112/72   Pulse: 70   Resp: 16   Temp:    SpO2: 99% 100%     General: Awake, interactive  CV:  Regular rate, good peripheral perfusion.  Resp:  Unlabored respirations, lungs clear to auscultation Abd:  Nondistended, soft, nontender Neuro:  Symmetric facial movement, fluid speech   ED Results / Procedures / Treatments   Labs (all labs ordered are listed, but only abnormal results are displayed) Labs Reviewed  BASIC METABOLIC PANEL WITH GFR -  Abnormal; Notable for the following components:      Result Value   Potassium 3.4 (*)    Chloride 97 (*)    Glucose, Bld 100 (*)    All other components within normal limits  CBC  MAGNESIUM   TSH  T4, FREE  TROPONIN I (HIGH SENSITIVITY)  TROPONIN I (HIGH SENSITIVITY)     EKG EKG independently reviewed and interpreted by myself demonstrates:  EKG demonstrates normal sinus rhythm at a rate of 85, PR 152, QRS 86, QTc 452, no acute ST changes  RADIOLOGY Imaging independently reviewed and interpreted by myself demonstrates:  CXR without focal consolidation  Formal Radiology Read:  DG Chest 2 View Result Date: 02/22/2024 EXAM: 2 VIEW(S) XRAY OF THE CHEST 02/22/2024 12:22:00 PM COMPARISON: 01/18/2024 CLINICAL HISTORY: palpitations. palpitations FINDINGS: LUNGS AND PLEURA: Low lung volume. No focal pulmonary opacity. No pulmonary edema. No pleural effusion. No pneumothorax. HEART AND MEDIASTINUM: No acute abnormality of the cardiac and mediastinal silhouettes. BONES AND SOFT TISSUES: Thoracic spondylosis. IMPRESSION: 1. No acute cardiopulmonary process identified. 2. Low lung volumes. Electronically signed by:  Waddell Calk MD 02/22/2024 01:01 PM EDT RP Workstation: [id number]    PROCEDURES:  Critical Care performed: No  Procedures   MEDICATIONS ORDERED IN ED: Medications  potassium chloride  SA (KLOR-CON  M) CR tablet 40 mEq (has no administration in time range)     IMPRESSION / MDM / ASSESSMENT AND PLAN / ED COURSE  I reviewed the triage vital signs and the nursing notes.  Differential diagnosis includes, but is not limited to, arrhythmia, anemia, electrolyte abnormality, ACS, stress mediated physiologic response  Patient's presentation is most consistent with acute presentation with potential threat to life or bodily function.  54 year old female presenting to the emergency department for evaluation of palpitations.  Stable vitals on presentation here.  Labs with reassuring  CBC.  BMP with mild hypokalemia, orally repleted.  Normal mag, reassuring thyroid  studies.  Negative troponin x 2.  EKG without acute ischemic findings.  X-Sydell Prowell without focal consolidation.  Patient on cardiac monitor here with stable vitals.  Consideration for intermittent arrhythmia causing patient's symptoms earlier today.  She has a history of sinus tachycardia and is on metoprolol .  With her improved symptoms, do think she is stable for discharge with outpatient follow-up.  Patient comfortable with this plan.  Strict return precautions provided.  Patient discharged in stable condition.       FINAL CLINICAL IMPRESSION(S) / ED DIAGNOSES   Final diagnoses:  Palpitations     Rx / DC Orders   ED Discharge Orders     None        Note:  This document was prepared using Dragon voice recognition software and may include unintentional dictation errors.   Levander Slate, MD 02/22/24 [id number]

## 2024-02-26 ENCOUNTER — Other Ambulatory Visit: Payer: Self-pay | Admitting: Nurse Practitioner

## 2024-02-26 DIAGNOSIS — R000 Tachycardia, unspecified: Secondary | ICD-10-CM

## 2024-02-27 ENCOUNTER — Ambulatory Visit (INDEPENDENT_AMBULATORY_CARE_PROVIDER_SITE_OTHER): Admitting: Nurse Practitioner

## 2024-02-27 ENCOUNTER — Telehealth: Payer: Self-pay

## 2024-02-27 ENCOUNTER — Encounter: Payer: Self-pay | Admitting: Nurse Practitioner

## 2024-02-27 VITALS — BP 124/86 | HR 96 | Temp 98.1°F | Resp 16 | Ht 62.0 in | Wt 267.0 lb

## 2024-02-27 DIAGNOSIS — J4541 Moderate persistent asthma with (acute) exacerbation: Secondary | ICD-10-CM | POA: Diagnosis not present

## 2024-02-27 DIAGNOSIS — Z1231 Encounter for screening mammogram for malignant neoplasm of breast: Secondary | ICD-10-CM

## 2024-02-27 DIAGNOSIS — E1169 Type 2 diabetes mellitus with other specified complication: Secondary | ICD-10-CM

## 2024-02-27 DIAGNOSIS — E876 Hypokalemia: Secondary | ICD-10-CM

## 2024-02-27 MED ORDER — POTASSIUM CHLORIDE ER 8 MEQ PO TBCR: 8.0000 meq | EXTENDED_RELEASE_TABLET | Freq: Every day | ORAL | 2 refills | Status: DC

## 2024-02-27 MED ORDER — AIRSUPRA 90-80 MCG/ACT IN AERO: 2.0000 | INHALATION_SPRAY | Freq: Four times a day (QID) | RESPIRATORY_TRACT | 3 refills | Status: AC | PRN

## 2024-02-27 NOTE — Progress Notes (Signed)
 [facility] [address] [place], [place] [address]  Internal MEDICINE  Office Visit Note  Patient Name: [name]  [id number]  [id number]  Date of Service: 02/27/2024  Chief Complaint  Patient presents with   Gastroesophageal Reflux   Hypertension   Follow-up    HPI [name] presents for a follow-up visit for PFT and echo results and new inhaler. PFT FINDINGS: The forced vital capacity is mildly decreased. FEV1 is 1.67 L which is 78% of predicted and is mildly decreased. Postbronchodilator there was significant improvement in the FEV1 noted. FEV1 FVC ratio was normal. Total lung capacity is mild decreased residual volume is normal FRC was mildly decreased. DLCO is increased. IMPRESSION: This pulmonary function studies suggest mild restrictive lung disease with a component of mild obstructive lung disease which is reversible clinical correlation is recommended  Echocardiogram results: echocardiogram results were normal.  New inhaler -- QVAR is helping some.     Current Medication: Outpatient Encounter Medications as of 02/27/2024  Medication Sig   Albuterol -Budesonide (AIRSUPRA) 90-80 MCG/ACT AERO Inhale 2 puffs into the lungs every 6 (six) hours as needed (SOB/wheezing).   potassium chloride  (KLOR-CON ) 8 MEQ tablet Take 1 tablet (8 mEq total) by mouth daily.   Accu-Chek Softclix Lancets lancets Use 1 lancet to check glucose three times daily as needed for diabetes   albuterol  (PROVENTIL ) (2.5 MG/3ML) 0.083% nebulizer solution Take 3 mLs (2.5 mg total) by nebulization every 6 (six) hours as needed for wheezing or shortness of breath.   beclomethasone (QVAR) 80 MCG/ACT inhaler Inhale 2 puffs into the lungs 2 (two) times daily.   Blood Glucose Monitoring Suppl (ACCU-CHEK GUIDE ME) w/Device KIT    chlorpheniramine-HYDROcodone  (TUSSIONEX) 10-8 MG/5ML Take 5 mLs by mouth every 12 (twelve) hours as needed.   clonazePAM (KLONOPIN) 0.5 MG tablet Take 0.5 mg by  mouth 2 (two) times daily as needed for anxiety. Take 1/2 tablet by mouth twice a day   gabapentin (NEURONTIN) 300 MG capsule Take 300 mg by mouth daily. Take 1 to 2 capsules by mouth nightly for sleep   glucose blood (ACCU-CHEK GUIDE TEST) test strip Use 1 test strip three times daily as needed to check glucose for type 2 diabetes E11.65   guaiFENesin  (ROBITUSSIN) 100 MG/5ML liquid Take 5 mLs by mouth every 4 (four) hours as needed for cough or to loosen phlegm.   irbesartan  (AVAPRO ) 75 MG tablet Take 1 tablet (75 mg total) by mouth daily.   lamoTRIgine (LAMICTAL) 200 MG tablet Take 200 mg in the am, and 300 mg at night   metFORMIN  (GLUCOPHAGE -XR) 500 MG 24 hr tablet Take 1 tablet (500 mg total) by mouth daily with breakfast.   nortriptyline (PAMELOR) 10 MG capsule Take 10 mg by mouth. Take 1 tablet po  in the morning   nortriptyline (PAMELOR) 50 MG capsule Take 50 mg by mouth at bedtime. Take 1 capsule by mouth at bedtime   potassium chloride  SA (KLOR-CON  M) 20 MEQ tablet Take 2 tablets (40 mEq total) by mouth daily for 2 days.   predniSONE  (DELTASONE ) 10 MG tablet Take one tab 3 x day for 3 days, then take one tab 2 x a day for 3 days and then take one tab a day for 3 days   QUEtiapine (SEROQUEL) 25 MG tablet Take 25 mg by mouth at bedtime. Take 1-2 tablets for sleep   torsemide  (DEMADEX ) 20 MG tablet Take 40 mg by mouth daily.   [DISCONTINUED] albuterol  (VENTOLIN  HFA)  108 (90 Base) MCG/ACT inhaler INHALE 1 TO 2 PUFFS BY MOUTH EVERY 6 HOURS AS NEEDED FOR WHEEZING FOR SHORTNESS OF BREATH   [DISCONTINUED] metoprolol  succinate (TOPROL -XL) 50 MG 24 hr tablet TAKE 1 & 1/2 (ONE & ONE-HALF) TABLETS BY MOUTH ONCE DAILY   No facility-administered encounter medications on file as of 02/27/2024.    Surgical History: Past Surgical History:  Procedure Laterality Date   ABDOMINAL HYSTERECTOMY N/A 06/09/2015   Procedure: HYSTERECTOMY ABDOMINAL with Bilateral Salpingectomy;  Surgeon: Debby JINNY Dinsmore,  MD;  Location: [facility] ORS;  Service: Gynecology;  Laterality: N/A;   CARPAL TUNNEL RELEASE Left 07/15/2023   Procedure: CARPAL TUNNEL RELEASE;  Surgeon: Kathlynn Sharper, MD;  Location: [facility];  Service: Orthopedics;  Laterality: Left;   CATARACT EXTRACTION W/PHACO Right 05/17/2023   Procedure: CATARACT EXTRACTION PHACO AND INTRAOCULAR LENS PLACEMENT (IOC) RIGHT MALYUGIN OMIDRIA 9.07 00:47.9;  Surgeon: Enola Feliciano Hugger, MD;  Location: [facility];  Service: Ophthalmology;  Laterality: Right;   CATARACT EXTRACTION W/PHACO Left 05/30/2023   Procedure: CATARACT EXTRACTION PHACO AND INTRAOCULAR LENS PLACEMENT (IOC) LEFT [name] 3.73 00:32.0;  Surgeon: Enola Feliciano Hugger, MD;  Location: [facility];  Service: Ophthalmology;  Laterality: Left;   CESAREAN SECTION  [id number]   COLONOSCOPY WITH PROPOFOL  N/A 10/24/2020   Procedure: COLONOSCOPY WITH PROPOFOL ;  Surgeon: Jinny Carmine, MD;  Location: [facility];  Service: Endoscopy;  Laterality: N/A;  sleep apnea   DORSAL COMPARTMENT RELEASE Left 07/15/2023   Procedure: RELEASE DORSAL COMPARTMENT (DEQUERVAIN);  Surgeon: Kathlynn Sharper, MD;  Location: [facility];  Service: Orthopedics;  Laterality: Left;   FINGER SURGERY  1992   FLEXIBLE SIGMOIDOSCOPY N/A 07/07/2023   Procedure: FLEXIBLE SIGMOIDOSCOPY;  Surgeon: Therisa Bi, MD;  Location: [facility];  Service: Gastroenterology;  Laterality: N/A;  Unsedated per office; patient states [name] might want sedation   GANGLION CYST EXCISION Left 07/15/2023   Procedure: REMOVAL GANGLION OF WRIST;  Surgeon: Kathlynn Sharper, MD;  Location: [facility];  Service: Orthopedics;  Laterality: Left;   GASTRIC BYPASS     INGUINAL HERNIA REPAIR Right 2010   REPLACEMENT TOTAL KNEE Right     Medical History: Past Medical History:  Diagnosis Date   Allergy    environmental   Anemia    Asthma    Bronchitis, acute    Complication of anesthesia    HAD ASTHMA ATTACK WHILE  UNDER   Gastroesophageal reflux disease without esophagitis 11/02/2017   Grade I diastolic dysfunction    Hypertension    Mild mitral regurgitation by prior echocardiogram    Morbid obesity with body mass index (BMI) of 45.0 to 49.9 in adult [facility])    Obstructive sleep apnea on CPAP    Seizure (HCC)    None since 01/2023   Sleep apnea    CPAP   Vertigo    10/24    Family History: Family History  Problem Relation Age of Onset   Diabetes Mother    Hypertension Mother    Cancer Mother    Congestive Heart Failure Father    Diabetes Father     Social History   Socioeconomic History   Marital status: Married    Spouse name: Not on file   Number of children: Not on file   Years of education: Not on file   Highest education level: Not on file  Occupational History   Not on file  Tobacco Use   Smoking status: Never   Smokeless tobacco: Never  Vaping  Use   Vaping status: Never Used  Substance and Sexual Activity   Alcohol use: No   Drug use: No   Sexual activity: Yes  Other Topics Concern   Not on file  Social History Narrative   Not on file   Social Drivers of Health   Financial Resource Strain: Not on file  Food Insecurity: Not on file  Transportation Needs: Not on file  Physical Activity: Not on file  Stress: Not on file  Social Connections: Not on file  Intimate Partner Violence: Not on file      Review of Systems  Constitutional:  Positive for fatigue.  Respiratory:  Positive for cough and shortness of breath. Negative for chest tightness and wheezing.   Cardiovascular:  Positive for palpitations. Negative for chest pain.  Gastrointestinal: Negative.   Musculoskeletal:  Positive for arthralgias.    Vital Signs: BP 124/86   Pulse 96   Temp 98.1 F (36.7 C)   Resp 16   Ht 5' 2 (1.575 m)   Wt 267 lb (121.1 kg)   LMP 05/28/2015   SpO2 97%   BMI 48.83 kg/m    Physical Exam Vitals reviewed.  Constitutional:      General: [name] is not in acute  distress.    Appearance: Normal appearance. [name] is obese. [name] is not ill-appearing.  HENT:     Head: Normocephalic and atraumatic.     Right Ear: Tympanic membrane, ear canal and external ear normal.     Left Ear: Tympanic membrane, ear canal and external ear normal.     Nose: Congestion and rhinorrhea present.     Mouth/Throat:     Mouth: Mucous membranes are moist.     Pharynx: Oropharynx is clear.  Eyes:     Pupils: Pupils are equal, round, and reactive to light.  Cardiovascular:     Rate and Rhythm: Normal rate and regular rhythm.     Heart sounds: Normal heart sounds. No murmur heard. Pulmonary:     Effort: Pulmonary effort is normal. No respiratory distress.     Breath sounds: Normal breath sounds. No wheezing or rales.  Skin:    General: Skin is warm and dry.     Capillary Refill: Capillary refill takes less than 2 seconds.  Neurological:     Mental Status: [name] is alert and oriented to person, place, and time.  Psychiatric:        Mood and Affect: Mood normal.        Behavior: Behavior normal.        Thought Content: Thought content normal.        Judgment: Judgment normal.        Assessment/Plan: 1. Moderate persistent asthma with acute exacerbation in adult (Primary) Continue dulera as prescribed. Airsupra prescribed to replace albuterol  inhaler.  - Albuterol -Budesonide (AIRSUPRA) 90-80 MCG/ACT AERO; Inhale 2 puffs into the lungs every 6 (six) hours as needed (SOB/wheezing).  Dispense: 32.1 g; Refill: 3  2. Type 2 diabetes mellitus with other specified complication, without long-term current use of insulin (HCC) Continue metformin  as prescribed.   3. Hypokalemia Repeat lab ordered, get this drawn in 1 month. Continue potassium supplement as prescribed.  - potassium chloride  (KLOR-CON ) 8 MEQ tablet; Take 1 tablet (8 mEq total) by mouth daily.  Dispense: 30 tablet; Refill: 2 - Basic Metabolic Panel (BMET)  4. Encounter for screening mammogram for malignant  neoplasm of breast Routine mammogram ordered  - MM 3D SCREENING MAMMOGRAM BILATERAL BREAST; Future  General Counseling: Chelise verbalizes understanding of the findings of todays visit and agrees with plan of treatment. I have discussed any further diagnostic evaluation that may be needed or ordered today. We also reviewed her medications today. [name] has been encouraged to call the office with any questions or concerns that should arise related to todays visit.    Orders Placed This Encounter  Procedures   Basic Metabolic Panel (BMET)    Meds ordered this encounter  Medications   Albuterol -Budesonide (AIRSUPRA) 90-80 MCG/ACT AERO    Sig: Inhale 2 puffs into the lungs every 6 (six) hours as needed (SOB/wheezing).    Dispense:  32.1 g    Refill:  3    Fill new script today   potassium chloride  (KLOR-CON ) 8 MEQ tablet    Sig: Take 1 tablet (8 mEq total) by mouth daily.    Dispense:  30 tablet    Refill:  2    Fill new script today    Return for previously scheduled, CPE, Gwenna Fuston PCP in october..   Total time spent:30 Minutes Time spent includes review of chart, medications, test results, and follow up plan with the patient.   [place] Controlled Substance Database was reviewed by me.  This patient was seen by Mardy Maxin, FNP-C in collaboration with Dr. Sigrid Bathe as a part of collaborative care agreement.   Benedetto Ryder R. Maxin, MSN, FNP-C Internal medicine

## 2024-02-28 NOTE — Telephone Encounter (Signed)
 Lmom that we sent med and repeat labs in 1 month

## 2024-02-29 ENCOUNTER — Telehealth: Payer: Self-pay

## 2024-02-29 NOTE — Telephone Encounter (Signed)
 Received paperwork from pt insurance about statin therapy paper work given to alyssa

## 2024-03-07 ENCOUNTER — Ambulatory Visit: Admitting: Podiatry

## 2024-03-14 ENCOUNTER — Telehealth: Payer: Self-pay | Admitting: Nurse Practitioner

## 2024-03-14 ENCOUNTER — Encounter: Payer: Self-pay | Admitting: Nurse Practitioner

## 2024-03-14 NOTE — Telephone Encounter (Signed)
 Received vm 8:20 pm 03/13/24 from patient stating she drank some water  and now if vomiting white globs. Patient instructed to go to ED per AA-Toni

## 2024-03-21 ENCOUNTER — Ambulatory Visit (INDEPENDENT_AMBULATORY_CARE_PROVIDER_SITE_OTHER): Payer: Self-pay | Admitting: Podiatry

## 2024-03-21 DIAGNOSIS — Z91199 Patient's noncompliance with other medical treatment and regimen due to unspecified reason: Secondary | ICD-10-CM

## 2024-03-22 NOTE — Progress Notes (Signed)
 Patient was no-show for appointment today

## 2024-03-23 ENCOUNTER — Encounter: Payer: 59 | Admitting: Nurse Practitioner

## 2024-03-23 ENCOUNTER — Telehealth: Payer: Self-pay | Admitting: Nurse Practitioner

## 2024-03-23 NOTE — Telephone Encounter (Signed)
 Left vm and sent mychart message to confirm 03/30/24 appointment-Toni

## 2024-03-26 ENCOUNTER — Other Ambulatory Visit
Admission: RE | Admit: 2024-03-26 | Discharge: 2024-03-26 | Disposition: A | Discharge status: Home / Self Care | Source: Ambulatory Visit | Attending: Nurse Practitioner | Admitting: Nurse Practitioner

## 2024-03-26 DIAGNOSIS — E876 Hypokalemia: Secondary | ICD-10-CM | POA: Insufficient documentation

## 2024-03-26 LAB — BASIC METABOLIC PANEL WITH GFR
Anion gap: 8 (ref 5–15)
BUN: 13 mg/dL (ref 6–20)
CO2: 32 mmol/L (ref 22–32)
Calcium: 9.1 mg/dL (ref 8.9–10.3)
Chloride: 101 mmol/L (ref 98–111)
Creatinine, Ser: 0.91 mg/dL (ref 0.44–1.00)
GFR, Estimated: >60 mL/min (ref >60)
Glucose, Bld: 103 mg/dL — ABNORMAL HIGH (ref 70–99)
Potassium: 4.4 mmol/L (ref 3.5–5.1)
Sodium: 141 mmol/L (ref 135–145)

## 2024-03-30 ENCOUNTER — Encounter: Payer: Self-pay | Admitting: Nurse Practitioner

## 2024-03-30 ENCOUNTER — Ambulatory Visit (INDEPENDENT_AMBULATORY_CARE_PROVIDER_SITE_OTHER): Payer: 59 | Admitting: Nurse Practitioner

## 2024-03-30 VITALS — BP 136/86 | HR 72 | Temp 96.3°F | Resp 16 | Ht 62.0 in | Wt 265.8 lb

## 2024-03-30 DIAGNOSIS — R300 Dysuria: Secondary | ICD-10-CM | POA: Diagnosis not present

## 2024-03-30 DIAGNOSIS — Z0001 Encounter for general adult medical examination with abnormal findings: Secondary | ICD-10-CM | POA: Diagnosis not present

## 2024-03-30 DIAGNOSIS — E876 Hypokalemia: Secondary | ICD-10-CM

## 2024-03-30 DIAGNOSIS — J4541 Moderate persistent asthma with (acute) exacerbation: Secondary | ICD-10-CM

## 2024-03-30 DIAGNOSIS — E1169 Type 2 diabetes mellitus with other specified complication: Secondary | ICD-10-CM | POA: Diagnosis not present

## 2024-03-30 DIAGNOSIS — Z1231 Encounter for screening mammogram for malignant neoplasm of breast: Secondary | ICD-10-CM

## 2024-03-30 NOTE — Progress Notes (Signed)
 [facility] [address] [place], [place] [address]  Internal MEDICINE  Office Visit Note  Patient Name: [name]  [id number]  [id number]  Date of Service: 03/30/2024  Chief Complaint  Patient presents with   Gastroesophageal Reflux   Hypertension   Annual Exam    HPI [name] presents for an annual well visit and physical exam.  Well-appearing 53 y.o. female with type 2 diabetes, hypertension, GERD, hyperlipidemia, asthma, and seizures.  Routine CRC screening: done in 2022, due in 2032.  Routine mammogram: due now  DEXA scan: not due yet  Pap smear: discontinued, hysterectomy.  Eye exam: last done in November, has cataracts removed last year  foot exam: done  Labs:  repeat labs reviewed.  New or worsening pain: none  Potassium level has improved to 4.4 now.     Current Medication: Outpatient Encounter Medications as of 03/30/2024  Medication Sig   Accu-Chek Softclix Lancets lancets Use 1 lancet to check glucose three times daily as needed for diabetes   albuterol  (PROVENTIL ) (2.5 MG/3ML) 0.083% nebulizer solution Take 3 mLs (2.5 mg total) by nebulization every 6 (six) hours as needed for wheezing or shortness of breath.   Albuterol -Budesonide (AIRSUPRA ) 90-80 MCG/ACT AERO Inhale 2 puffs into the lungs every 6 (six) hours as needed (SOB/wheezing).   beclomethasone (QVAR) 80 MCG/ACT inhaler Inhale 2 puffs into the lungs 2 (two) times daily.   Blood Glucose Monitoring Suppl (ACCU-CHEK GUIDE ME) w/Device KIT    clonazePAM (KLONOPIN) 0.5 MG tablet Take 0.5 mg by mouth 2 (two) times daily as needed for anxiety. Take 1/2 tablet by mouth twice a day   gabapentin (NEURONTIN) 300 MG capsule Take 300 mg by mouth daily. Take 1 to 2 capsules by mouth nightly for sleep   glucose blood (ACCU-CHEK GUIDE TEST) test strip Use 1 test strip three times daily as needed to check glucose for type 2 diabetes E11.65   irbesartan  (AVAPRO ) 75 MG tablet Take 1 tablet (75 mg total)  by mouth daily.   lamoTRIgine (LAMICTAL) 200 MG tablet Take 200 mg in the am, and 300 mg at night   metFORMIN  (GLUCOPHAGE -XR) 500 MG 24 hr tablet Take 1 tablet (500 mg total) by mouth daily with breakfast.   metoprolol  succinate (TOPROL -XL) 50 MG 24 hr tablet TAKE 1 & 1/2 (ONE & ONE-HALF) TABLETS BY MOUTH ONCE DAILY   nortriptyline (PAMELOR) 10 MG capsule Take 10 mg by mouth. Take 1 tablet po  in the morning   nortriptyline (PAMELOR) 50 MG capsule Take 50 mg by mouth at bedtime. Take 1 capsule by mouth at bedtime   potassium chloride  (KLOR-CON ) 8 MEQ tablet Take 1 tablet (8 mEq total) by mouth daily.   QUEtiapine (SEROQUEL) 25 MG tablet Take 25 mg by mouth at bedtime. Take 1-2 tablets for sleep   torsemide  (DEMADEX ) 20 MG tablet Take 40 mg by mouth daily.   [DISCONTINUED] potassium chloride  SA (KLOR-CON  M) 20 MEQ tablet Take 2 tablets (40 mEq total) by mouth daily for 2 days.   No facility-administered encounter medications on file as of 03/30/2024.    Surgical History: Past Surgical History:  Procedure Laterality Date   ABDOMINAL HYSTERECTOMY N/A 06/09/2015   Procedure: HYSTERECTOMY ABDOMINAL with Bilateral Salpingectomy;  Surgeon: Debby JINNY Dinsmore, MD;  Location: [facility];  Service: Gynecology;  Laterality: N/A;   CARPAL TUNNEL RELEASE Left 07/15/2023   Procedure: CARPAL TUNNEL RELEASE;  Surgeon: Kathlynn Sharper, MD;  Location: [facility];  Service: Orthopedics;  Laterality: Left;   CATARACT EXTRACTION W/PHACO Right 05/17/2023   Procedure: CATARACT EXTRACTION PHACO AND INTRAOCULAR LENS PLACEMENT (IOC) RIGHT MALYUGIN OMIDRIA 9.07 00:47.9;  Surgeon: Enola Feliciano Hugger, MD;  Location: [facility];  Service: Ophthalmology;  Laterality: Right;   CATARACT EXTRACTION W/PHACO Left 05/30/2023   Procedure: CATARACT EXTRACTION PHACO AND INTRAOCULAR LENS PLACEMENT (IOC) LEFT [name] 3.73 00:32.0;  Surgeon: Enola Feliciano Hugger, MD;  Location: [facility];  Service:  Ophthalmology;  Laterality: Left;   CESAREAN SECTION  [id number]   COLONOSCOPY WITH PROPOFOL  N/A 10/24/2020   Procedure: COLONOSCOPY WITH PROPOFOL ;  Surgeon: Jinny Carmine, MD;  Location: [facility];  Service: Endoscopy;  Laterality: N/A;  sleep apnea   DORSAL COMPARTMENT RELEASE Left 07/15/2023   Procedure: RELEASE DORSAL COMPARTMENT (DEQUERVAIN);  Surgeon: Kathlynn Sharper, MD;  Location: [facility];  Service: Orthopedics;  Laterality: Left;   FINGER SURGERY  1992   FLEXIBLE SIGMOIDOSCOPY N/A 07/07/2023   Procedure: FLEXIBLE SIGMOIDOSCOPY;  Surgeon: Therisa Bi, MD;  Location: [facility];  Service: Gastroenterology;  Laterality: N/A;  Unsedated per office; patient states she might want sedation   GANGLION CYST EXCISION Left 07/15/2023   Procedure: REMOVAL GANGLION OF WRIST;  Surgeon: Kathlynn Sharper, MD;  Location: [facility];  Service: Orthopedics;  Laterality: Left;   GASTRIC BYPASS     INGUINAL HERNIA REPAIR Right 2010   REPLACEMENT TOTAL KNEE Right     Medical History: Past Medical History:  Diagnosis Date   Allergy    environmental   Anemia    Asthma    Bronchitis, acute    Complication of anesthesia    HAD ASTHMA ATTACK WHILE UNDER   Gastroesophageal reflux disease without esophagitis 11/02/2017   Grade I diastolic dysfunction    Hypertension    Mild mitral regurgitation by prior echocardiogram    Morbid obesity with body mass index (BMI) of 45.0 to 49.9 in adult [facility])    Obstructive sleep apnea on CPAP    Seizure (HCC)    None since 01/2023   Sleep apnea    CPAP   Vertigo    10/24    Family History: Family History  Problem Relation Age of Onset   Diabetes Mother    Hypertension Mother    Cancer Mother    Congestive Heart Failure Father    Diabetes Father     Social History   Socioeconomic History   Marital status: Married    Spouse name: Not on file   Number of children: Not on file   Years of education: Not on file   Highest  education level: Not on file  Occupational History   Not on file  Tobacco Use   Smoking status: Never   Smokeless tobacco: Never  Vaping Use   Vaping status: Never Used  Substance and Sexual Activity   Alcohol use: No   Drug use: No   Sexual activity: Yes  Other Topics Concern   Not on file  Social History Narrative   Not on file   Social Drivers of Health   Financial Resource Strain: Not on file  Food Insecurity: Not on file  Transportation Needs: Not on file  Physical Activity: Not on file  Stress: Not on file  Social Connections: Not on file  Intimate Partner Violence: Not on file      Review of Systems  Constitutional:  Positive for fatigue and unexpected weight change. Negative for activity change, appetite change, chills and fever.  HENT:  Positive  for postnasal drip. Negative for congestion, ear pain, rhinorrhea, sinus pressure, sinus pain, sneezing, sore throat and trouble swallowing.   Eyes: Negative.   Respiratory:  Positive for cough (intermittent). Negative for chest tightness, shortness of breath and wheezing.   Cardiovascular: Negative.  Negative for chest pain and palpitations.  Gastrointestinal: Negative.  Negative for abdominal pain, blood in stool, constipation, diarrhea, nausea and vomiting.  Endocrine: Negative.   Genitourinary: Negative.  Negative for difficulty urinating, dysuria, frequency, hematuria and urgency.  Musculoskeletal: Negative.  Negative for arthralgias, back pain, joint swelling, myalgias and neck pain.  Skin: Negative.  Negative for rash and wound.  Allergic/Immunologic: Negative.  Negative for immunocompromised state.  Neurological: Negative.  Negative for dizziness, seizures, numbness and headaches.  Hematological: Negative.   Psychiatric/Behavioral: Negative.  Negative for behavioral problems, self-injury and suicidal ideas. The patient is not nervous/anxious.     Vital Signs: BP 136/86   Pulse 72   Temp (!) 96.3 F (35.7 C)    Resp 16   Ht 5' 2 (1.575 m)   Wt 265 lb 12.8 oz (120.6 kg)   LMP 05/28/2015   SpO2 99%   BMI 48.62 kg/m    Physical Exam Vitals reviewed.  Constitutional:      General: She is awake. She is not in acute distress.    Appearance: Normal appearance. She is well-developed and well-groomed. She is obese. She is not ill-appearing or diaphoretic.  HENT:     Head: Normocephalic and atraumatic.     Right Ear: Tympanic membrane, ear canal and external ear normal.     Left Ear: Tympanic membrane, ear canal and external ear normal.     Nose: Nose normal. No congestion or rhinorrhea.     Mouth/Throat:     Lips: Pink.     Mouth: Mucous membranes are moist.     Pharynx: Oropharynx is clear. Uvula midline. No oropharyngeal exudate or posterior oropharyngeal erythema.  Eyes:     General: Lids are normal. Vision grossly intact. Gaze aligned appropriately. No scleral icterus.       Right eye: No discharge.        Left eye: No discharge.     Extraocular Movements: Extraocular movements intact.     Conjunctiva/sclera: Conjunctivae normal.     Pupils: Pupils are equal, round, and reactive to light.     Funduscopic exam:    Right eye: Red reflex present.        Left eye: Red reflex present. Neck:     Thyroid : No thyromegaly.     Vascular: No JVD.     Trachea: No tracheal deviation.  Cardiovascular:     Rate and Rhythm: Normal rate and regular rhythm.     Pulses: Normal pulses.     Heart sounds: Normal heart sounds, S1 normal and S2 normal. No murmur heard.    No friction rub. No gallop.  Pulmonary:     Effort: Pulmonary effort is normal. No accessory muscle usage or respiratory distress.     Breath sounds: Normal breath sounds and air entry. No stridor. No decreased breath sounds, wheezing or rales.  Chest:     Chest wall: No tenderness.  Breasts:    Breasts are symmetrical.     Right: Normal. No swelling, bleeding, inverted nipple, mass, nipple discharge, skin change or tenderness.      Left: Normal. No swelling, bleeding, inverted nipple, mass, nipple discharge, skin change or tenderness.  Abdominal:     General: Bowel sounds are  normal. There is no distension.     Palpations: Abdomen is soft. There is no shifting dullness, fluid wave, mass or pulsatile mass.     Tenderness: There is no abdominal tenderness. There is no guarding or rebound.  Musculoskeletal:        General: No tenderness or deformity. Normal range of motion.     Cervical back: Normal range of motion and neck supple.     Right lower leg: Edema present.     Left lower leg: Edema present.  Lymphadenopathy:     Cervical: No cervical adenopathy.     Upper Body:     Right upper body: No supraclavicular, axillary or pectoral adenopathy.     Left upper body: No supraclavicular, axillary or pectoral adenopathy.  Skin:    General: Skin is warm and dry.     Coloration: Skin is not pale.     Findings: No erythema or rash.     Comments: No atypical moles or suspicious lesions noted   Neurological:     Mental Status: She is alert and oriented to person, place, and time.     Cranial Nerves: No cranial nerve deficit.     Motor: No abnormal muscle tone.     Coordination: Coordination normal.     Gait: Gait normal.     Deep Tendon Reflexes: Reflexes are normal and symmetric.  Psychiatric:        Behavior: Behavior normal. Behavior is cooperative.        Thought Content: Thought content normal.        Judgment: Judgment normal.        Assessment/Plan: 1. Encounter for routine adult health examination with abnormal findings (Primary) Age-appropriate preventive screenings and vaccinations discussed, annual physical exam completed. Routine labs for health maintenance results discussed with the patient today. PHM updated.    2. Moderate persistent asthma with acute exacerbation in adult Continue follow up with Dr. Elfreda Bathe as scheduled. Continue medications as prescribed.   3. Type 2 diabetes mellitus with  other specified complication, without long-term current use of insulin (HCC) Continue medications and diabetic diet and discussed and prescribed. Need ACR done   4. Hypokalemia Resolved   5. Dysuria Urine sent to lab  - UA/M w/rflx Culture, Routine - Microscopic Examination  6. Encounter for screening mammogram for malignant neoplasm of breast Routine mammogram ordered  - MM 3D SCREENING MAMMOGRAM BILATERAL BREAST; Future     General Counseling: [name] verbalizes understanding of the findings of todays visit and agrees with plan of treatment. I have discussed any further diagnostic evaluation that may be needed or ordered today. We also reviewed her medications today. she has been encouraged to call the office with any questions or concerns that should arise related to todays visit.    Orders Placed This Encounter  Procedures   MM 3D SCREENING MAMMOGRAM BILATERAL BREAST   UA/M w/rflx Culture, Routine   Urine Microalbumin w/creat. ratio    No orders of the defined types were placed in this encounter.   Return in about 2 months (around 06/06/2024) for F/U, Recheck A1C, [name] Horace PCP.   Total time spent:30 Minutes Time spent includes review of chart, medications, test results, and follow up plan with the patient.   [place] Controlled Substance Database was reviewed by me.  This patient was seen by Mardy Maxin, FNP-C in collaboration with Dr. Sigrid Bathe as a part of collaborative care agreement.  [name] Pedley R. Maxin, MSN, FNP-C Internal medicine

## 2024-03-31 LAB — UA/M W/RFLX CULTURE, ROUTINE
Bilirubin, UA: NEGATIVE
Glucose, UA: NEGATIVE
Ketones, UA: NEGATIVE
Leukocytes,UA: NEGATIVE
Nitrite, UA: NEGATIVE
Protein,UA: NEGATIVE
RBC, UA: NEGATIVE
Specific Gravity, UA: 1.009 (ref 1.005–1.030)
Urobilinogen, Ur: 1 mg/dL (ref 0.2–1.0)
pH, UA: 7 (ref 5.0–7.5)

## 2024-03-31 LAB — MICROSCOPIC EXAMINATION
Bacteria, UA: NONE SEEN
Casts: NONE SEEN /LPF
WBC, UA: NONE SEEN /HPF (ref 0–5)

## 2024-04-22 ENCOUNTER — Encounter: Payer: Self-pay | Admitting: Nurse Practitioner

## 2024-04-25 ENCOUNTER — Emergency Department: Admission: EM | Admit: 2024-04-25 | Discharge: 2024-04-25 | Disposition: A | Discharge status: Home / Self Care

## 2024-04-25 ENCOUNTER — Other Ambulatory Visit: Payer: Self-pay

## 2024-04-25 DIAGNOSIS — R519 Headache, unspecified: Secondary | ICD-10-CM | POA: Diagnosis not present

## 2024-04-25 DIAGNOSIS — E119 Type 2 diabetes mellitus without complications: Secondary | ICD-10-CM | POA: Insufficient documentation

## 2024-04-25 DIAGNOSIS — I10 Essential (primary) hypertension: Secondary | ICD-10-CM | POA: Diagnosis not present

## 2024-04-25 DIAGNOSIS — R569 Unspecified convulsions: Secondary | ICD-10-CM | POA: Insufficient documentation

## 2024-04-25 HISTORY — DX: Type 2 diabetes mellitus without complications: E11.9

## 2024-04-25 LAB — COMPREHENSIVE METABOLIC PANEL WITH GFR
ALT: 15 U/L (ref 0–44)
AST: 20 U/L (ref 15–41)
Albumin: 3.5 g/dL (ref 3.5–5.0)
Alkaline Phosphatase: 123 U/L (ref 38–126)
Anion gap: 8 (ref 5–15)
BUN: 13 mg/dL (ref 6–20)
CO2: 30 mmol/L (ref 22–32)
Calcium: 8.9 mg/dL (ref 8.9–10.3)
Chloride: 103 mmol/L (ref 98–111)
Creatinine, Ser: 1.16 mg/dL — ABNORMAL HIGH (ref 0.44–1.00)
GFR, Estimated: 56 mL/min — ABNORMAL LOW (ref >60)
Glucose, Bld: 124 mg/dL — ABNORMAL HIGH (ref 70–99)
Potassium: 3.8 mmol/L (ref 3.5–5.1)
Sodium: 140 mmol/L (ref 135–145)
Total Bilirubin: 0.4 mg/dL (ref 0.0–1.2)
Total Protein: 8.2 g/dL — ABNORMAL HIGH (ref 6.5–8.1)

## 2024-04-25 LAB — CBC WITH DIFFERENTIAL/PLATELET
Abs Immature Granulocytes: 0.01 K/uL (ref 0.00–0.07)
Basophils Absolute: 0 K/uL (ref 0.0–0.1)
Basophils Relative: 1 %
Eosinophils Absolute: 0.2 K/uL (ref 0.0–0.5)
Eosinophils Relative: 4 %
HCT: 40.8 % (ref 36.0–46.0)
Hemoglobin: 13.4 g/dL (ref 12.0–15.0)
Immature Granulocytes: 0 %
Lymphocytes Relative: 49 %
Lymphs Abs: 2.5 K/uL (ref 0.7–4.0)
MCH: 29.8 pg (ref 26.0–34.0)
MCHC: 32.8 g/dL (ref 30.0–36.0)
MCV: 90.7 fL (ref 80.0–100.0)
Monocytes Absolute: 0.4 K/uL (ref 0.1–1.0)
Monocytes Relative: 7 %
Neutro Abs: 2 K/uL (ref 1.7–7.7)
Neutrophils Relative %: 39 %
Platelets: 334 K/uL (ref 150–400)
RBC: 4.5 MIL/uL (ref 3.87–5.11)
RDW: 13.3 % (ref 11.5–15.5)
WBC: 5 K/uL (ref 4.0–10.5)
nRBC: 0 % (ref 0.0–0.2)

## 2024-04-25 LAB — MAGNESIUM: Magnesium: 2.8 mg/dL — ABNORMAL HIGH (ref 1.7–2.4)

## 2024-04-25 MED ORDER — KETOROLAC TROMETHAMINE 15 MG/ML IJ SOLN
15.0000 mg | Freq: Once | INTRAMUSCULAR | Status: AC
  Administered 2024-04-25: 15 mg via INTRAVENOUS
  Filled 2024-04-25: qty 1

## 2024-04-25 MED ORDER — MAGNESIUM SULFATE 2 GM/50ML IV SOLN
2.0000 g | Freq: Once | INTRAVENOUS | Status: AC
  Administered 2024-04-25: 2 g via INTRAVENOUS
  Filled 2024-04-25: qty 50

## 2024-04-25 MED ORDER — LORAZEPAM 2 MG/ML IJ SOLN
0.5000 mg | Freq: Once | INTRAMUSCULAR | Status: AC
  Administered 2024-04-25: 0.5 mg via INTRAVENOUS
  Filled 2024-04-25: qty 1

## 2024-04-25 MED ORDER — SODIUM CHLORIDE 0.9 % IV BOLUS
1000.0000 mL | Freq: Once | INTRAVENOUS | Status: AC
  Administered 2024-04-25: 1000 mL via INTRAVENOUS

## 2024-04-25 NOTE — ED Provider Notes (Signed)
 [facility] Provider Note    None    (approximate)   History   Seizures   HPI  [name] is a 53 y.o. 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  Patient was at home face timing with her daughter when she had a 2-minute episode where she could not respond to her daughter was shaking.  Patient had 2 additional episodes via EMS where she would stare out in space and not move 1 side of her face.  Patient reports that she has missed several doses of her lamictal 2 days ago.  Endorses a mild headache.  Denies any hearing or vision changes, any sensation changes.  Family arrives at bedside later reports she is at her baseline mental status      Physical Exam   Triage Vital Signs: ED Triage Vitals [04/25/24 1100]  Encounter Vitals Group     BP      Girls Systolic BP Percentile      Girls Diastolic BP Percentile      Boys Systolic BP Percentile      Boys Diastolic BP Percentile      Pulse      Resp      Temp      Temp src      SpO2 99 %     Weight      Height      Head Circumference      Peak Flow      Pain Score      Pain Loc      Pain Education      Exclude from Growth Chart     Most recent vital signs: Vitals:   04/25/24 1245 04/25/24 1300  BP: 102/60 93/60  Pulse: 73 63  Resp: 19 14  Temp:    SpO2: 97% 98%    Nursing Triage Note reviewed. Vital signs reviewed and patients oxygen saturation is normoxic  General: Patient is well nourished, well developed, awake and alert, resting comfortably in no acute distress Head: Normocephalic and atraumatic Eyes: Normal inspection, extraocular muscles intact, no conjunctival pallor Ear, nose, throat: Normal external exam Neck: Normal range of motion Respiratory: Patient is in no  respiratory distress, lungs CTAB Cardiovascular: Patient is not tachycardic, RRR without murmur appreciated GI: Abd SNT with no guarding or rebound  Back: Normal inspection of the back with good strength and range of motion throughout all ext Extremities: pulses intact with good cap refills, no LE pitting edema or calf tenderness Neuro: The patient is alert and oriented to person, place, and time, appropriately conversive, with 5/5 bilat UE/LE strength, no gross motor or sensory defects noted. Coordination appears to be adequate. There was initial concern in the left marginal mandibular nerve was not activating however this does activate intermittently when patient is talking or responding to movements Skin: Warm, dry, and intact Psych: normal mood and affect, no SI or HI  ED Results / Procedures / Treatments   Labs (all labs ordered are listed, but only abnormal results are displayed) Labs Reviewed  MAGNESIUM  - Abnormal; Notable for the following components:      Result Value   Magnesium  2.8 (*)    All other components within normal limits  COMPREHENSIVE METABOLIC PANEL WITH GFR - Abnormal; Notable for the following components:   Glucose,  Bld 124 (*)    Creatinine, Ser 1.16 (*)    Total Protein 8.2 (*)    GFR, Estimated 56 (*)    All other components within normal limits  CBC WITH DIFFERENTIAL/PLATELET  LAMOTRIGINE  LEVEL     EKG EKG and rhythm strip are interpreted by myself:   EKG: [Normal sinus rhythm] at heart rate of 84, normal QRS duration, QTc 452, normal  ST segments and T waves no ectopy EKG not consistent with Acute STEMI Rhythm strip: NSR in lead II   RADIOLOGY None    PROCEDURES:  Critical Care performed: No  Procedures   MEDICATIONS ORDERED IN ED: Medications  sodium chloride  0.9 % bolus 1,000 mL (1,000 mLs Intravenous New Bag/Given 04/25/24 1145)  LORazepam  (ATIVAN ) injection 0.5 mg (0.5 mg Intravenous Given 04/25/24 1140)  ketorolac  (TORADOL ) 15  MG/ML injection 15 mg (15 mg Intravenous Given 04/25/24 1141)  magnesium  sulfate IVPB 2 g 50 mL (2 g Intravenous New Bag/Given 04/25/24 1148)     IMPRESSION / MDM / ASSESSMENT AND PLAN / ED COURSE                                Differential diagnosis includes, but is not limited to, seizure, PNES, electrolyte derangement anemia, atypical migraine, arrhythmia   ED course: Patient arrives and she is not hypotensive without any tachycardia.  EKG demonstrated no evidence of arrhythmia.  I appreciate no focal neurological deficits on my presentation.  I did review the neurology note from 03/08/2024 from Dr. Arthea Farrow.  To treat the patient's headache I have ordered IV fluids, a small dose of IV Ativan , magnesium  and ketorolac .  Patient has no leukocytosis or acute anemia.   Clinical Course as of 04/25/24 1449  Wed Apr 25, 2024  1141 Daughter and husband at bedside report that she is at her baseline mental status and has a history of doing similar presentations [HD]  1213 WBC: 5.0 No leukocytosis [HD]  1213 Hemoglobin: 13.4 No acute anemia [HD]  1327 Magnesium (!): 2.8 Slightly elevated [HD]  1345 Patient feels much better.  Feels comfortable returning home.  I explained the magnesium  to patient and husband satisfaction.  They will have this number rechecked in 2 weeks.  All questions answered and patient voiced understanding and ready for discharge [HD]    Clinical Course User Index [HD] Nicholaus Rolland BRAVO, MD   Of note patient's magnesium  did come back elevated today however this was checked after patient had already received the magnesium  and from the same line (given for migraine cocktail).  She has no evidence of hypermagnesia on exam.  She was counseled to avoid any further magnesium  supplementation for the next several weeks.  She was counseled to have her neurologist follow  up on a Lamictal  level that is pending.  At time of discharge there is no evidence of acute life, limb,  vision, or fertility threat. Patient has stable vital signs, pain is well controlled, patient is ambulatory and p.o. tolerant.  Discharge instructions were completed using the EPIC system. I would refer you to those at this time. All warnings prescriptions follow-up etc. were discussed in detail with the patient. Patient indicates understanding and is agreeable with this plan. All questions answered.  Patient is made aware that they may return to the emergency department for any worsening or new condition or for any other emergency.  -- Risk: 5 This patient has a high risk  of morbidity due to further diagnostic testing or treatment. Rationale: This patient's evaluation and management involve a high risk of morbidity due to the potential severity of presenting symptoms, need for diagnostic testing, and/or initiation of treatment that may require close monitoring. The differential includes conditions with potential for significant deterioration or requiring escalation of care. Treatment decisions in the ED, including medication administration, procedural interventions, or disposition planning, reflect this level of risk. COPA: 5 The patient has the following acute or chronic illness/injury that poses a possible threat to life or bodily function: [X] : The patient has a potentially serious acute condition or an acute exacerbation of a chronic illness requiring urgent evaluation and management in the Emergency Department. The clinical presentation necessitates immediate consideration of life-threatening or function-threatening diagnoses, even if they are ultimately ruled out.   FINAL CLINICAL IMPRESSION(S) / ED DIAGNOSES   Final diagnoses:  Seizure-like activity (HCC)  Nonintractable headache, unspecified chronicity pattern, unspecified headache type     Rx / DC Orders   ED Discharge Orders     None        Note:  This document was prepared using Dragon voice recognition software and may  include unintentional dictation errors.   Nicholaus Rolland BRAVO, MD 04/25/24 [phone]

## 2024-04-25 NOTE — Discharge Instructions (Signed)
 1) you were seen in the emergency department for seizure-like activity.  Please continue your home medications.  Let your neurology physician know that Lamictal level is pending.  Please call and make an appointment with your neurology provider.  2) your magnesium  level was slightly elevated today.  Please avoid any magnesium  supplements for the next 2 weeks.   3) rest and hydrate today.  Stay away from screens.  Return with any acutely worsening symptoms -- RETURN PRECAUTIONS & AFTERCARE: (ENGLISH) RETURN PRECAUTIONS: Return immediately to the emergency department or see/call your doctor if you feel worse, weak or have changes in speech or vision, are short of breath, have fever, vomiting, pain, bleeding or dark stool, trouble urinating or any new issues. Return here or see/call your doctor if not improving as expected for your suspected condition. FOLLOW-UP CARE: Call your doctor and/or any doctors we referred you to for more advice and to make an appointment. Do this today, tomorrow or after the weekend. Some doctors only take PPO insurance so if you have HMO insurance you may want to contact your HMO or your regular doctor for referral to a specialist within your plan. Either way tell the doctor's office that it was a referral from the emergency department so you get the soonest possible appointment.  YOUR TEST RESULTS: Take result reports of any blood or urine tests, imaging tests and EKG's to your doctor and any referral doctor. Have any abnormal tests repeated. Your doctor or a referral doctor can let you know when this should be done. Also make sure your doctor contacts this hospital to get any test results that are not currently available such as cultures or special tests for infection and final imaging reports, which are often not available at the time you leave the ER but which may list additional important findings that are not documented on the preliminary report. BLOOD PRESSURE: If your blood  pressure was greater than 120/80 have your blood pressure rechecked within 1 to 2 weeks. MEDICATION SIDE EFFECTS: Do not drive, walk, bike, take the bus, etc. if you have received or are being prescribed any sedating medications such as those for pain or anxiety or certain antihistamines like Benadryl . If you have been give one of these here get a taxi home or have a friend drive you home. Ask your pharmacist to counsel you on potential side effects of any new medication

## 2024-04-25 NOTE — ED Notes (Signed)
 CCMD called to initiate cardiac monitoring.

## 2024-04-25 NOTE — ED Triage Notes (Signed)
 Pt arrived from home via [facility] d/t CC of seizures. Per EMS, pt was on facetime with daughter when pt had one seizure that lasted about 2 minutes, pt not able to respond to daughter during seizure. EMS reports that pt had 2 seizures while in the ambulance that were focal in nature. Per EMS, pt has a hx of seizures. Airway is patent at this time. Pt is AO x4 at this time.

## 2024-04-27 LAB — LAMOTRIGINE LEVEL: Lamotrigine Lvl: 14.1 ug/mL (ref 2.0–20.0)

## 2024-04-30 ENCOUNTER — Ambulatory Visit (INDEPENDENT_AMBULATORY_CARE_PROVIDER_SITE_OTHER): Admitting: Internal Medicine

## 2024-04-30 ENCOUNTER — Encounter: Payer: Self-pay | Admitting: Internal Medicine

## 2024-04-30 VITALS — BP 105/86 | HR 92 | Temp 98.0°F | Resp 16 | Ht 62.0 in | Wt 251.8 lb

## 2024-04-30 DIAGNOSIS — G4733 Obstructive sleep apnea (adult) (pediatric): Secondary | ICD-10-CM | POA: Diagnosis not present

## 2024-04-30 DIAGNOSIS — J4520 Mild intermittent asthma, uncomplicated: Secondary | ICD-10-CM

## 2024-04-30 NOTE — Patient Instructions (Signed)

## 2024-04-30 NOTE — Progress Notes (Signed)
 [facility] [address] [place], [place] [address]  Pulmonary Sleep Medicine   Office Visit Note  Patient Name: [name] DOB: September 12, 1970 MRN [id number]  Date of Service: 04/30/2024  Complaints/HPI: She has a history of asthma. PFT done ins eptember confirms MILD obstruction. She is using her inhaler About 2x daily with good response. She notes no wrosening of her symptoms. She states she does feel more short of breath if she does not use the inhalers. Patient hsa no chest pain. She has not been able to lose weight. Patient also has OSA and she states she is using the PAP regualrly. Her machine is apparently 54 years old. She has not had a followup PSG done  Office Spirometry Results:     ROS  General: (-) fever, (-) chills, (-) night sweats, (-) weakness Skin: (-) rashes, (-) itching,. Eyes: (-) visual changes, (-) redness, (-) itching. Nose and Sinuses: (-) nasal stuffiness or itchiness, (-) postnasal drip, (-) nosebleeds, (-) sinus trouble. Mouth and Throat: (-) sore throat, (-) hoarseness. Neck: (-) swollen glands, (-) enlarged thyroid , (-) neck pain. Respiratory: - cough, (-) bloody sputum, + shortness of breath, - wheezing. Cardiovascular: - ankle swelling, (-) chest pain. Lymphatic: (-) lymph node enlargement. Neurologic: (-) numbness, (-) tingling. Psychiatric: (-) anxiety, (-) depression   Current Medication: Outpatient Encounter Medications as of 04/30/2024  Medication Sig   Accu-Chek Softclix Lancets lancets Use 1 lancet to check glucose three times daily as needed for diabetes   albuterol  (PROVENTIL ) (2.5 MG/3ML) 0.083% nebulizer solution Take 3 mLs (2.5 mg total) by nebulization every 6 (six) hours as needed for wheezing or shortness of breath.   Albuterol -Budesonide (AIRSUPRA ) 90-80 MCG/ACT AERO Inhale 2 puffs into the lungs every 6 (six) hours as needed (SOB/wheezing).   beclomethasone (QVAR) 80 MCG/ACT inhaler Inhale 2 puffs into the lungs 2  (two) times daily.   Blood Glucose Monitoring Suppl (ACCU-CHEK GUIDE ME) w/Device KIT    clonazePAM (KLONOPIN) 0.5 MG tablet Take 0.5 mg by mouth 2 (two) times daily as needed for anxiety. Take 1/2 tablet by mouth twice a day   gabapentin (NEURONTIN) 300 MG capsule Take 300 mg by mouth daily. Take 1 to 2 capsules by mouth nightly for sleep   glucose blood (ACCU-CHEK GUIDE TEST) test strip Use 1 test strip three times daily as needed to check glucose for type 2 diabetes E11.65   irbesartan  (AVAPRO ) 75 MG tablet Take 1 tablet (75 mg total) by mouth daily.   lamoTRIgine (LAMICTAL) 200 MG tablet Take 200 mg in the am, and 300 mg at night   metFORMIN  (GLUCOPHAGE -XR) 500 MG 24 hr tablet Take 1 tablet (500 mg total) by mouth daily with breakfast.   metoprolol  succinate (TOPROL -XL) 50 MG 24 hr tablet TAKE 1 & 1/2 (ONE & ONE-HALF) TABLETS BY MOUTH ONCE DAILY   nortriptyline (PAMELOR) 10 MG capsule Take 10 mg by mouth. Take 1 tablet po  in the morning   nortriptyline (PAMELOR) 50 MG capsule Take 50 mg by mouth at bedtime. Take 1 capsule by mouth at bedtime   potassium chloride  (KLOR-CON ) 8 MEQ tablet Take 1 tablet (8 mEq total) by mouth daily.   QUEtiapine (SEROQUEL) 25 MG tablet Take 25 mg by mouth at bedtime. Take 1-2 tablets for sleep   torsemide  (DEMADEX ) 20 MG tablet Take 40 mg by mouth daily.   No facility-administered encounter medications on file as of 04/30/2024.    Surgical History: Past Surgical History:  Procedure  Laterality Date   ABDOMINAL HYSTERECTOMY N/A 06/09/2015   Procedure: HYSTERECTOMY ABDOMINAL with Bilateral Salpingectomy;  Surgeon: Debby JINNY Dinsmore, MD;  Location: [facility] ORS;  Service: Gynecology;  Laterality: N/A;   CARPAL TUNNEL RELEASE Left 07/15/2023   Procedure: CARPAL TUNNEL RELEASE;  Surgeon: Kathlynn Sharper, MD;  Location: [facility];  Service: Orthopedics;  Laterality: Left;   CATARACT EXTRACTION W/PHACO Right 05/17/2023   Procedure: CATARACT EXTRACTION PHACO  AND INTRAOCULAR LENS PLACEMENT (IOC) RIGHT MALYUGIN OMIDRIA 9.07 00:47.9;  Surgeon: Enola Feliciano Hugger, MD;  Location: [facility];  Service: Ophthalmology;  Laterality: Right;   CATARACT EXTRACTION W/PHACO Left 05/30/2023   Procedure: CATARACT EXTRACTION PHACO AND INTRAOCULAR LENS PLACEMENT (IOC) LEFT [name] 3.73 00:32.0;  Surgeon: Enola Feliciano Hugger, MD;  Location: [facility];  Service: Ophthalmology;  Laterality: Left;   CESAREAN SECTION  [id number]   COLONOSCOPY WITH PROPOFOL  N/A 10/24/2020   Procedure: COLONOSCOPY WITH PROPOFOL ;  Surgeon: Jinny Carmine, MD;  Location: [facility];  Service: Endoscopy;  Laterality: N/A;  sleep apnea   DORSAL COMPARTMENT RELEASE Left 07/15/2023   Procedure: RELEASE DORSAL COMPARTMENT (DEQUERVAIN);  Surgeon: Kathlynn Sharper, MD;  Location: [facility];  Service: Orthopedics;  Laterality: Left;   FINGER SURGERY  1992   FLEXIBLE SIGMOIDOSCOPY N/A 07/07/2023   Procedure: FLEXIBLE SIGMOIDOSCOPY;  Surgeon: Therisa Bi, MD;  Location: [facility] END[facility];  Service: Gastroenterology;  Laterality: N/A;  Unsedated per office; patient states she might want sedation   GANGLION CYST EXCISION Left 07/15/2023   Procedure: REMOVAL GANGLION OF WRIST;  Surgeon: Kathlynn Sharper, MD;  Location: [facility];  Service: Orthopedics;  Laterality: Left;   GASTRIC BYPASS     INGUINAL HERNIA REPAIR Right 2010   REPLACEMENT TOTAL KNEE Right     Medical History: Past Medical History:  Diagnosis Date   Allergy    environmental   Anemia    Asthma    Bronchitis, acute    Complication of anesthesia    HAD ASTHMA ATTACK WHILE UNDER   Diabetes mellitus without complication (HCC)    Gastroesophageal reflux disease without esophagitis 11/02/2017   Grade I diastolic dysfunction    Hypertension    Mild mitral regurgitation by prior echocardiogram    Morbid obesity with body mass index (BMI) of 45.0 to 49.9 in adult [facility])    Obstructive sleep apnea on  CPAP    Seizure (HCC)    None since 01/2023   Sleep apnea    CPAP   Vertigo    10/24    Family History: Family History  Problem Relation Age of Onset   Diabetes Mother    Hypertension Mother    Cancer Mother    Congestive Heart Failure Father    Diabetes Father     Social History: Social History   Socioeconomic History   Marital status: Married    Spouse name: Not on file   Number of children: Not on file   Years of education: Not on file   Highest education level: Not on file  Occupational History   Not on file  Tobacco Use   Smoking status: Never   Smokeless tobacco: Never  Vaping Use   Vaping status: Never Used  Substance and Sexual Activity   Alcohol use: No   Drug use: No   Sexual activity: Yes  Other Topics Concern   Not on file  Social History Narrative   Not on file   Social Drivers of Health   Financial Resource Strain: Not  on file  Food Insecurity: Not on file  Transportation Needs: Not on file  Physical Activity: Not on file  Stress: Not on file  Social Connections: Not on file  Intimate Partner Violence: Not on file    Vital Signs: Blood pressure 105/86, pulse 92, temperature 98 F (36.7 C), resp. rate 16, height 5' 2 (1.575 m), weight 251 lb 12.8 oz (114.2 kg), last menstrual period 05/28/2015, SpO2 98%.  Examination: General Appearance: The patient is well-developed, well-nourished, and in no distress. Skin: Gross inspection of skin unremarkable. Head: normocephalic, no gross deformities. Eyes: no gross deformities noted. ENT: ears appear grossly normal no exudates. Neck: Supple. No thyromegaly. No LAD. Respiratory: no rhonchi noted. Cardiovascular: Normal S1 and S2 without murmur or rub. Extremities: No cyanosis. pulses are equal. Neurologic: Alert and oriented. No involuntary movements.  LABS: Recent Results (from the past 2160 hours)  Pulmonary Function Test     Status: None   Collection Time: 02/08/24  8:12 AM  Result Value  Ref Range   FEV1     FVC     FEV1/FVC     TLC     DLCO    Basic metabolic panel     Status: Abnormal   Collection Time: 02/22/24 12:47 PM  Result Value Ref Range   Sodium 138 135 - 145 mmol/L   Potassium 3.4 (L) 3.5 - 5.1 mmol/L   Chloride 97 (L) 98 - 111 mmol/L   CO2 31 22 - 32 mmol/L   Glucose, Bld 100 (H) 70 - 99 mg/dL    Comment: Glucose reference range applies only to samples taken after fasting for at least 8 hours.   BUN 11 6 - 20 mg/dL   Creatinine, Ser 8.99 0.44 - 1.00 mg/dL   Calcium 8.9 8.9 - 89.6 mg/dL   GFR, Estimated >39 >39 mL/min    Comment: (NOTE) Calculated using the CKD-EPI Creatinine Equation (2021)    Anion gap 10 5 - 15    Comment: Performed at [facility], [address] [place], [place] [address]  CBC     Status: None   Collection Time: 02/22/24 12:47 PM  Result Value Ref Range   WBC 5.5 4.0 - 10.5 K/uL   RBC 4.07 3.87 - 5.11 MIL/uL   Hemoglobin 12.3 12.0 - 15.0 g/dL   HCT 63.0 63.9 - 53.9 %   MCV 90.7 80.0 - 100.0 fL   MCH 30.2 26.0 - 34.0 pg   MCHC 33.3 30.0 - 36.0 g/dL   RDW 87.4 88.4 - 84.4 %   Platelets 307 150 - 400 K/uL   nRBC 0.0 0.0 - 0.2 %    Comment: Performed at [facility], [address], [place], [place] [address]  Troponin I (High Sensitivity)     Status: None   Collection Time: 02/22/24 12:47 PM  Result Value Ref Range   Troponin I (High Sensitivity) 3 <18 ng/L    Comment: (NOTE) Elevated high sensitivity troponin I (hsTnI) values and significant  changes across serial measurements may suggest ACS but many other  chronic and acute conditions are known to elevate hsTnI results.  Refer to the Links section for chest pain algorithms and additional  guidance. Performed at [facility], [address] [place], [place] [address]   Magnesium      Status: None   Collection Time: 02/22/24 12:47 PM  Result Value Ref Range   Magnesium  2.2 1.7 - 2.4 mg/dL    Comment: Performed at [employer]  [facility], [address] [place], [place] [address]  TSH     Status: None   Collection Time: 02/22/24 12:47 PM  Result Value Ref Range   TSH 1.498 0.350 - 4.500 uIU/mL    Comment: Performed by a 3rd Generation assay with a functional sensitivity of <=0.01 uIU/mL. Performed at [facility], [address] [place], [place] [address]   T4, free     Status: None   Collection Time: 02/22/24 12:48 PM  Result Value Ref Range   Free T4 0.83 0.61 - 1.12 ng/dL    Comment: (NOTE) Biotin ingestion may interfere with free T4 tests. If the results are inconsistent with the TSH level, previous test results, or the clinical presentation, then consider biotin interference. If needed, order repeat testing after stopping biotin. Performed at [facility], [address] [place], [place] [address]   Troponin I (High Sensitivity)     Status: None   Collection Time: 02/22/24  4:22 PM  Result Value Ref Range   Troponin I (High Sensitivity) 3 <18 ng/L    Comment: (NOTE) Elevated high sensitivity troponin I (hsTnI) values and significant  changes across serial measurements may suggest ACS but many other  chronic and acute conditions are known to elevate hsTnI results.  Refer to the Links section for chest pain algorithms and additional  guidance. Performed at [facility], [address] [place], [place] [address]   Basic metabolic panel     Status: Abnormal   Collection Time: 03/26/24  5:09 PM  Result Value Ref Range   Sodium 141 135 - 145 mmol/L   Potassium 4.4 3.5 - 5.1 mmol/L   Chloride 101 98 - 111 mmol/L   CO2 32 22 - 32 mmol/L   Glucose, Bld 103 (H) 70 - 99 mg/dL    Comment: Glucose reference range applies only to samples taken after fasting for at least 8 hours.   BUN 13 6 - 20 mg/dL   Creatinine, Ser 9.08 0.44 - 1.00 mg/dL   Calcium 9.1 8.9 - 89.6 mg/dL   GFR, Estimated >39 >39 mL/min    Comment: (NOTE) Calculated using the CKD-EPI Creatinine Equation  (2021)    Anion gap 8 5 - 15    Comment: Performed at [facility], [address] [place], [place] [address]  UA/M w/rflx Culture, Routine     Status: None   Collection Time: 03/30/24 10:07 AM   Specimen: Urine   Urine  Result Value Ref Range   Specific Gravity, UA 1.009 1.005 - 1.030   pH, UA 7.0 5.0 - 7.5   Color, UA Yellow Yellow   Appearance Ur Clear Clear   Leukocytes,UA Negative Negative   Protein,UA Negative Negative/Trace   Glucose, UA Negative Negative   Ketones, UA Negative Negative   RBC, UA Negative Negative   Bilirubin, UA Negative Negative   Urobilinogen, Ur 1.0 0.2 - 1.0 mg/dL   Nitrite, UA Negative Negative   Microscopic Examination Comment     Comment: Microscopic follows if indicated.   Microscopic Examination See below:     Comment: Microscopic was indicated and was performed.   Urinalysis Reflex Comment     Comment: This specimen will not reflex to a Urine Culture.  Microscopic Examination     Status: None   Collection Time: 03/30/24 10:07 AM   Urine  Result Value Ref Range   WBC, UA None seen 0 - 5 /hpf   RBC, Urine 0-2 0 -  2 /hpf   Epithelial Cells (non renal) 0-10 0 - 10 /hpf   Casts None seen None seen /lpf   Bacteria, UA None seen None seen/Few  Lamotrigine level     Status: None   Collection Time: 04/25/24 11:13 AM  Result Value Ref Range   Lamotrigine Lvl 14.1 2.0 - 20.0 ug/mL    Comment: (NOTE)                                Detection Limit = 1.0 Performed At: [facility] [employer] [address] [place], [place] [id number] Jennette Shorter MD Ey:[id number]   CBC with Differential     Status: None   Collection Time: 04/25/24 11:13 AM  Result Value Ref Range   WBC 5.0 4.0 - 10.5 K/uL   RBC 4.50 3.87 - 5.11 MIL/uL   Hemoglobin 13.4 12.0 - 15.0 g/dL   HCT 59.1 63.9 - 53.9 %   MCV 90.7 80.0 - 100.0 fL   MCH 29.8 26.0 - 34.0 pg   MCHC 32.8 30.0 - 36.0 g/dL   RDW 86.6 88.4 - 84.4 %   Platelets 334 150 - 400 K/uL   nRBC 0.0 0.0 -  0.2 %   Neutrophils Relative % 39 %   Neutro Abs 2.0 1.7 - 7.7 K/uL   Lymphocytes Relative 49 %   Lymphs Abs 2.5 0.7 - 4.0 K/uL   Monocytes Relative 7 %   Monocytes Absolute 0.4 0.1 - 1.0 K/uL   Eosinophils Relative 4 %   Eosinophils Absolute 0.2 0.0 - 0.5 K/uL   Basophils Relative 1 %   Basophils Absolute 0.0 0.0 - 0.1 K/uL   Immature Granulocytes 0 %   Abs Immature Granulocytes 0.01 0.00 - 0.07 K/uL    Comment: Performed at [facility], [address] [place], [place] [address]  Magnesium      Status: Abnormal   Collection Time: 04/25/24 12:42 PM  Result Value Ref Range   Magnesium  2.8 (H) 1.7 - 2.4 mg/dL    Comment: Performed at [facility], [address] [place], [place] [address]  Comprehensive metabolic panel with GFR     Status: Abnormal   Collection Time: 04/25/24 12:42 PM  Result Value Ref Range   Sodium 140 135 - 145 mmol/L   Potassium 3.8 3.5 - 5.1 mmol/L   Chloride 103 98 - 111 mmol/L   CO2 30 22 - 32 mmol/L   Glucose, Bld 124 (H) 70 - 99 mg/dL    Comment: Glucose reference range applies only to samples taken after fasting for at least 8 hours.   BUN 13 6 - 20 mg/dL   Creatinine, Ser 8.83 (H) 0.44 - 1.00 mg/dL   Calcium 8.9 8.9 - 89.6 mg/dL   Total Protein 8.2 (H) 6.5 - 8.1 g/dL   Albumin 3.5 3.5 - 5.0 g/dL   AST 20 15 - 41 U/L   ALT 15 0 - 44 U/L   Alkaline Phosphatase 123 38 - 126 U/L   Total Bilirubin 0.4 0.0 - 1.2 mg/dL   GFR, Estimated 56 (L) >60 mL/min    Comment: (NOTE) Calculated using the CKD-EPI Creatinine Equation (2021)    Anion gap 8 5 - 15    Comment: Performed at [facility], [address], [place], [place] [address]    Radiology: No results found.  No results found.  No results found.  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 OSA (obstructive sleep apnea) (Primary) She needs a new machine her machine has basically been not updated for 15 years and is not functioning properly apparently. Will get a PSG and set up - PSG Sleep Study; Future  2. Obesity, morbid (HCC) Obesity Counseling: Had a lengthy discussion regarding patients BMI and weight issues. Patient was instructed on portion control as well as increased activity. Also discussed caloric restrictions with trying to maintain intake less than 2000 Kcal.  3. Chronic asthma, mild intermittent, uncomplicated Appears to be under control. She will continue with current inhalers   General Counseling: I have discussed the findings of the evaluation and examination with Katrine.  I have also discussed any further diagnostic evaluation thatmay be needed or ordered today. Maudy verbalizes understanding of the findings of todays visit. We also reviewed her medications today and discussed drug interactions and side effects including but not limited excessive drowsiness and altered mental states. We also discussed that there is always a risk not just to her but also people around her. she has been encouraged to call the office with any questions or concerns that should arise related to todays  visit.  No orders of the defined types were placed in this encounter.    Time spent: 81  I have personally obtained a history, examined the patient, evaluated laboratory and imaging results, formulated the assessment and plan and placed orders.    Elfreda DELENA Bathe, MD [facility] Pulmonary and Critical Care Sleep medicine

## 2024-05-01 ENCOUNTER — Telehealth: Payer: Self-pay | Admitting: Internal Medicine

## 2024-05-01 NOTE — Telephone Encounter (Signed)
 SS order emailed to [facility]

## 2024-05-04 ENCOUNTER — Ambulatory Visit: Attending: Cardiology | Admitting: Cardiology

## 2024-05-04 ENCOUNTER — Encounter: Payer: Self-pay | Admitting: Cardiology

## 2024-05-04 VITALS — BP 115/62 | HR 85 | Ht 62.0 in | Wt 261.0 lb

## 2024-05-04 DIAGNOSIS — I10 Essential (primary) hypertension: Secondary | ICD-10-CM | POA: Diagnosis not present

## 2024-05-04 DIAGNOSIS — I5030 Unspecified diastolic (congestive) heart failure: Secondary | ICD-10-CM

## 2024-05-04 NOTE — Patient Instructions (Signed)

## 2024-05-04 NOTE — Progress Notes (Signed)
 Cardiology Office Note:    Date:  05/04/2024   ID:  [name], DOB Jan 22, 1971, MRN [id number]  PCP:  Liana Fish, NP   [facility] Providers Cardiologist:  Redell Cave, MD     Referring MD: Liana Fish, NP   Chief Complaint  Patient presents with   Follow-up     6 month follow up pt has been doing well with no complaints of chest pain, chest pressure or SOB, medciation reviewed verbally with patient, had some palpitations last week     History of Present Illness:    [name] is a 52 y.o. female with a hx of HFpEF, hypertension, morbid obesity, asthma presenting for follow-up.    Doing okay, denies chest pain or shortness of breath.  Takes torsemide  as prescribed, has adequate diuresing every other day.  Edema is adequately controlled.  Still working on eating healthy in order to lose weight.  Previously seen due to leg edema, morbid obesity.  Lasix  switched to torsemide  with good effect.  Follow-up BMP showed normal potassium, stable creatinine.  Still has some shortness of breath with exertion.  Endorsed eating carbs, sweets, sodas.  Has increased her activity to help with weight loss.  Prior notes/testing Echocardiogram obtained 04/2022 showed EF 60 to 65%, impaired relaxation.  Past Medical History:  Diagnosis Date   Allergy    environmental   Anemia    Asthma    Bronchitis, acute    Complication of anesthesia    HAD ASTHMA ATTACK WHILE UNDER   Diabetes mellitus without complication (HCC)    Gastroesophageal reflux disease without esophagitis 11/02/2017   Grade I diastolic dysfunction    Hypertension    Mild mitral regurgitation by prior echocardiogram    Morbid obesity with body mass index (BMI) of 45.0 to 49.9 in adult [facility])    Obstructive sleep apnea on CPAP    Seizure (HCC)    None since 01/2023   Sleep apnea    CPAP   Vertigo    10/24    Past Surgical History:  Procedure Laterality Date   ABDOMINAL HYSTERECTOMY N/A  06/09/2015   Procedure: HYSTERECTOMY ABDOMINAL with Bilateral Salpingectomy;  Surgeon: Debby JINNY Dinsmore, MD;  Location: [facility] ORS;  Service: Gynecology;  Laterality: N/A;   CARPAL TUNNEL RELEASE Left 07/15/2023   Procedure: CARPAL TUNNEL RELEASE;  Surgeon: Kathlynn Sharper, MD;  Location: [facility];  Service: Orthopedics;  Laterality: Left;   CATARACT EXTRACTION W/PHACO Right 05/17/2023   Procedure: CATARACT EXTRACTION PHACO AND INTRAOCULAR LENS PLACEMENT (IOC) RIGHT MALYUGIN OMIDRIA 9.07 00:47.9;  Surgeon: Enola Feliciano Hugger, MD;  Location: [facility];  Service: Ophthalmology;  Laterality: Right;   CATARACT EXTRACTION W/PHACO Left 05/30/2023   Procedure: CATARACT EXTRACTION PHACO AND INTRAOCULAR LENS PLACEMENT (IOC) LEFT [name] 3.73 00:32.0;  Surgeon: Enola Feliciano Hugger, MD;  Location: [facility];  Service: Ophthalmology;  Laterality: Left;   CESAREAN SECTION  8011,8006   COLONOSCOPY WITH PROPOFOL  N/A 10/24/2020   Procedure: COLONOSCOPY WITH PROPOFOL ;  Surgeon: Jinny Carmine, MD;  Location: [facility];  Service: Endoscopy;  Laterality: N/A;  sleep apnea   DORSAL COMPARTMENT RELEASE Left 07/15/2023   Procedure: RELEASE DORSAL COMPARTMENT (DEQUERVAIN);  Surgeon: Kathlynn Sharper, MD;  Location: [facility];  Service: Orthopedics;  Laterality: Left;   FINGER SURGERY  1992   FLEXIBLE SIGMOIDOSCOPY N/A 07/07/2023   Procedure: FLEXIBLE SIGMOIDOSCOPY;  Surgeon: Therisa Bi, MD;  Location: [facility] ENDOSCOPY;  Service: Gastroenterology;  Laterality: N/A;  Unsedated  per office; patient states she might want sedation   GANGLION CYST EXCISION Left 07/15/2023   Procedure: REMOVAL GANGLION OF WRIST;  Surgeon: Kathlynn Sharper, MD;  Location: [facility];  Service: Orthopedics;  Laterality: Left;   GASTRIC BYPASS     INGUINAL HERNIA REPAIR Right 2010   REPLACEMENT TOTAL KNEE Right     Current Medications: Current Meds  Medication Sig   Accu-Chek Softclix  Lancets lancets Use 1 lancet to check glucose three times daily as needed for diabetes   albuterol  (PROVENTIL ) (2.5 MG/3ML) 0.083% nebulizer solution Take 3 mLs (2.5 mg total) by nebulization every 6 (six) hours as needed for wheezing or shortness of breath.   Albuterol -Budesonide (AIRSUPRA ) 90-80 MCG/ACT AERO Inhale 2 puffs into the lungs every 6 (six) hours as needed (SOB/wheezing).   beclomethasone (QVAR) 80 MCG/ACT inhaler Inhale 2 puffs into the lungs 2 (two) times daily.   Blood Glucose Monitoring Suppl (ACCU-CHEK GUIDE ME) w/Device KIT    clonazePAM (KLONOPIN) 0.5 MG tablet Take 0.5 mg by mouth 2 (two) times daily as needed for anxiety. Take 1/2 tablet by mouth twice a day   gabapentin (NEURONTIN) 300 MG capsule Take 300 mg by mouth daily. Take 1 to 2 capsules by mouth nightly for sleep   glucose blood (ACCU-CHEK GUIDE TEST) test strip Use 1 test strip three times daily as needed to check glucose for type 2 diabetes E11.65   irbesartan  (AVAPRO ) 75 MG tablet Take 1 tablet (75 mg total) by mouth daily.   lamoTRIgine  (LAMICTAL ) 200 MG tablet Take 200 mg in the am, and 300 mg at night   metFORMIN  (GLUCOPHAGE -XR) 500 MG 24 hr tablet Take 1 tablet (500 mg total) by mouth daily with breakfast.   metoprolol  succinate (TOPROL -XL) 50 MG 24 hr tablet TAKE 1 & 1/2 (ONE & ONE-HALF) TABLETS BY MOUTH ONCE DAILY   nortriptyline (PAMELOR) 10 MG capsule Take 10 mg by mouth. Take 1 tablet po  in the morning   nortriptyline (PAMELOR) 50 MG capsule Take 50 mg by mouth at bedtime. Take 1 capsule by mouth at bedtime   potassium chloride  (KLOR-CON ) 8 MEQ tablet Take 1 tablet (8 mEq total) by mouth daily.   QUEtiapine (SEROQUEL) 25 MG tablet Take 25 mg by mouth at bedtime. Take 1-2 tablets for sleep   torsemide  (DEMADEX ) 20 MG tablet Take 40 mg by mouth daily.     Allergies:   Keppra  [levetiracetam ] and Duloxetine    Social History   Socioeconomic History   Marital status: Married    Spouse name: Not on file    Number of children: Not on file   Years of education: Not on file   Highest education level: Not on file  Occupational History   Not on file  Tobacco Use   Smoking status: Never   Smokeless tobacco: Never  Vaping Use   Vaping status: Never Used  Substance and Sexual Activity   Alcohol use: No   Drug use: No   Sexual activity: Yes  Other Topics Concern   Not on file  Social History Narrative   Not on file   Social Drivers of Health   Financial Resource Strain: Not on file  Food Insecurity: Not on file  Transportation Needs: Not on file  Physical Activity: Not on file  Stress: Not on file  Social Connections: Not on file     Family History: The patient's family history includes Cancer in her mother; Congestive Heart Failure in her father; Diabetes in  her father and mother; Hypertension in her mother.  ROS:   Please see the history of present illness.     All other systems reviewed and are negative.  EKGs/Labs/Other Studies Reviewed:    The following studies were reviewed today:  EKG Interpretation Date/Time:  Friday May 04 2024 10:08:54 EST Ventricular Rate:  85 PR Interval:  130 QRS Duration:  90 QT Interval:  382 QTC Calculation: 454 R Axis:   -3  Text Interpretation: Normal sinus rhythm Minimal voltage criteria for LVH, may be normal variant ( R in aVL ) Cannot rule out Anterior infarct , age undetermined Confirmed by Darliss Rogue ([id number]) on 05/04/2024 10:18:19 AM    Recent Labs: 02/22/2024: TSH 1.498 04/25/2024: ALT 15; BUN 13; Creatinine, Ser 1.16; Hemoglobin 13.4; Magnesium  2.8; Platelets 334; Potassium 3.8; Sodium 140  Recent Lipid Panel    Component Value Date/Time   CHOL 194 08/16/2023 0806   CHOL 158 06/28/2012 0746   TRIG 135 08/16/2023 0806   TRIG 125 06/28/2012 0746   HDL 39 (L) 08/16/2023 0806   HDL 33 (L) 06/28/2012 0746   CHOLHDL 5.0 08/16/2023 0806   VLDL 27 08/16/2023 0806   VLDL 25 06/28/2012 0746   LDLCALC 128 (H)  08/16/2023 0806   LDLCALC 100 06/28/2012 0746     Risk Assessment/Calculations:             Physical Exam:    VS:  BP 115/62 (BP Location: Left Arm, Patient Position: Sitting)   Pulse 85   Ht 5' 2 (1.575 m)   Wt 261 lb (118.4 kg)   LMP 05/28/2015   SpO2 98%   BMI 47.74 kg/m     Wt Readings from Last 3 Encounters:  05/04/24 261 lb (118.4 kg)  04/30/24 251 lb 12.8 oz (114.2 kg)  04/25/24 262 lb (118.8 kg)     GEN:  Well nourished, well developed in no acute distress HEENT: Normal NECK: No JVD; No carotid bruits CARDIAC: RRR, no murmurs, rubs, gallops RESPIRATORY:  Clear to auscultation without rales, wheezing or rhonchi  ABDOMEN: Soft, non-tender, non-distended  MUSCULOSKELETAL:  trace edema; No deformity  SKIN: Warm and dry NEUROLOGIC:  Alert and oriented x 3 PSYCHIATRIC:  Normal affect   ASSESSMENT:    1. Heart failure with preserved ejection fraction (HFpEF), unspecified HF chronicity (HCC)   2. Morbid obesity (HCC)   3. Primary hypertension    PLAN:    In order of problems listed above:  HFpEF, EF 60 to 65%, impaired relaxation, trace edema.  Dyspnea driven by morbid obesity.  Volume status adequate.  Continue torsemide  40 mg daily.   Morbid obesity, low-calorie diet, weight loss advised.  Advised to cut back on carbs, sweets, sodas. Hypertension, BP controlled.  Continue irbesartan  to 75 mg daily, continue Toprol -XL 75 mg daily.  Follow-up in 12 months.     Medication Adjustments/Labs and Tests Ordered: Current medicines are reviewed at length with the patient today.  Concerns regarding medicines are outlined above.  Orders Placed This Encounter  Procedures   EKG 12-Lead   No orders of the defined types were placed in this encounter.   Patient Instructions  Medication Instructions:  Your physician recommends that you continue on your current medications as directed. Please refer to the Current Medication list given to you today.   *If you need a  refill on your cardiac medications before your next appointment, please call your pharmacy*  Lab Work: No labs ordered today  If you have  labs (blood work) drawn today and your tests are completely normal, you will receive your results only by: MyChart Message (if you have MyChart) OR A paper copy in the mail If you have any lab test that is abnormal or we need to change your treatment, we will call you to review the results.  Testing/Procedures: No test ordered today   Follow-Up: At [facility] you and your health needs are our priority.  As part of our continuing mission to provide you with exceptional heart care, our providers are all part of one team.  This team includes your primary Cardiologist (physician) and Advanced Practice Providers or APPs (Physician Assistants and Nurse Practitioners) who all work together to provide you with the care you need, when you need it.  Your next appointment:   1 year(s)  Provider:   You may see Redell Cave, MD or one of the following Advanced Practice Providers on your designated Care Team:   Lonni Meager, NP Lesley Maffucci, PA-C Bernardino Bring, PA-C Cadence Santa Claus, PA-C Tylene Lunch, NP Barnie Hila, NP    We recommend signing up for the patient portal called MyChart.  Sign up information is provided on this After Visit Summary.  MyChart is used to connect with patients for Virtual Visits (Telemedicine).  Patients are able to view lab/test results, encounter notes, upcoming appointments, etc.  Non-urgent messages can be sent to your provider as well.   To learn more about what you can do with MyChart, go to [email or web address].            Signed, Redell Cave, MD  05/04/2024 10:53 AM    [facility]

## 2024-05-14 ENCOUNTER — Telehealth: Payer: Self-pay | Admitting: Nurse Practitioner

## 2024-05-14 ENCOUNTER — Telehealth: Payer: Self-pay

## 2024-05-14 NOTE — Telephone Encounter (Signed)
 Please don't send US  med any supply for meter pt is not using that company

## 2024-05-14 NOTE — Telephone Encounter (Signed)
 Left vm for patient to return my call regarding diabetic supply company-[employer]

## 2024-05-25 ENCOUNTER — Other Ambulatory Visit: Payer: Self-pay | Admitting: Nurse Practitioner

## 2024-05-25 DIAGNOSIS — E876 Hypokalemia: Secondary | ICD-10-CM

## 2024-06-01 ENCOUNTER — Encounter (INDEPENDENT_AMBULATORY_CARE_PROVIDER_SITE_OTHER): Payer: Self-pay | Admitting: Internal Medicine

## 2024-06-01 DIAGNOSIS — G4733 Obstructive sleep apnea (adult) (pediatric): Secondary | ICD-10-CM

## 2024-06-08 ENCOUNTER — Ambulatory Visit: Admitting: Nurse Practitioner

## 2024-06-08 ENCOUNTER — Encounter: Payer: Self-pay | Admitting: Nurse Practitioner

## 2024-06-08 VITALS — BP 136/88 | HR 91 | Temp 96.1°F | Resp 16 | Ht 62.0 in | Wt 265.6 lb

## 2024-06-08 DIAGNOSIS — K219 Gastro-esophageal reflux disease without esophagitis: Secondary | ICD-10-CM | POA: Diagnosis not present

## 2024-06-08 DIAGNOSIS — E1159 Type 2 diabetes mellitus with other circulatory complications: Secondary | ICD-10-CM

## 2024-06-08 DIAGNOSIS — E785 Hyperlipidemia, unspecified: Secondary | ICD-10-CM

## 2024-06-08 DIAGNOSIS — I152 Hypertension secondary to endocrine disorders: Secondary | ICD-10-CM

## 2024-06-08 DIAGNOSIS — E876 Hypokalemia: Secondary | ICD-10-CM

## 2024-06-08 DIAGNOSIS — E1169 Type 2 diabetes mellitus with other specified complication: Secondary | ICD-10-CM | POA: Diagnosis not present

## 2024-06-08 LAB — POCT GLYCOSYLATED HEMOGLOBIN (HGB A1C): Hemoglobin A1C: 6.5 % — AB (ref 4.0–5.6)

## 2024-06-08 MED ORDER — IRBESARTAN 75 MG PO TABS: 37.5000 mg | ORAL_TABLET | Freq: Every day | ORAL | 5 refills | Status: AC

## 2024-06-08 NOTE — Progress Notes (Signed)
 [facility] [address] [place], [place] [address]  Internal MEDICINE  Office Visit Note  Patient Name: [name]  [id number]  [id number]  Date of Service: 06/08/2024  Chief Complaint  Patient presents with   Diabetes   Gastroesophageal Reflux   Hypertension   Follow-up    HPI Sharah presents for a follow-up visit for diabetes, hypertension, low potassium and GERD.  Diabetes -- A1c is stable and improved to 6.5 today from 7.1 several months ago.  Hypertension -- down to 1/2 tablet of irbesartan  daily per her cardiologist.  Low potassium -- taking potassium supplement GERD -- omeprazole  was prescribed but she is worried about the risk of osteoporosis. She has an upcoming appt with GI and wants to wait to take the medication until she sees the GI specialist.      Current Medication: Outpatient Encounter Medications as of 06/08/2024  Medication Sig   Accu-Chek Softclix Lancets lancets Use 1 lancet to check glucose three times daily as needed for diabetes   albuterol  (PROVENTIL ) (2.5 MG/3ML) 0.083% nebulizer solution Take 3 mLs (2.5 mg total) by nebulization every 6 (six) hours as needed for wheezing or shortness of breath.   Albuterol -Budesonide (AIRSUPRA ) 90-80 MCG/ACT AERO Inhale 2 puffs into the lungs every 6 (six) hours as needed (SOB/wheezing).   beclomethasone (QVAR) 80 MCG/ACT inhaler Inhale 2 puffs into the lungs 2 (two) times daily.   Blood Glucose Monitoring Suppl (ACCU-CHEK GUIDE ME) w/Device KIT    clonazePAM (KLONOPIN) 0.5 MG tablet Take 0.5 mg by mouth 2 (two) times daily as needed for anxiety. Take 1/2 tablet by mouth twice a day   gabapentin (NEURONTIN) 300 MG capsule Take 300 mg by mouth daily. Take 1 to 2 capsules by mouth nightly for sleep   glucose blood (ACCU-CHEK GUIDE TEST) test strip Use 1 test strip three times daily as needed to check glucose for type 2 diabetes E11.65   irbesartan  (AVAPRO ) 75 MG tablet Take 0.5 tablets (37.5 mg total) by  mouth daily.   lamoTRIgine  (LAMICTAL ) 200 MG tablet Take 200 mg in the am, and 300 mg at night   metFORMIN  (GLUCOPHAGE -XR) 500 MG 24 hr tablet Take 1 tablet (500 mg total) by mouth daily with breakfast.   metoprolol  succinate (TOPROL -XL) 50 MG 24 hr tablet TAKE 1 & 1/2 (ONE & ONE-HALF) TABLETS BY MOUTH ONCE DAILY   nortriptyline (PAMELOR) 10 MG capsule Take 10 mg by mouth. Take 1 tablet po  in the morning   nortriptyline (PAMELOR) 50 MG capsule Take 50 mg by mouth at bedtime. Take 1 capsule by mouth at bedtime   potassium chloride  (KLOR-CON ) 8 MEQ tablet Take 1 tablet by mouth once daily   QUEtiapine (SEROQUEL) 25 MG tablet Take 25 mg by mouth at bedtime. Take 1-2 tablets for sleep   torsemide  (DEMADEX ) 20 MG tablet Take 40 mg by mouth daily.   [DISCONTINUED] irbesartan  (AVAPRO ) 75 MG tablet Take 1 tablet (75 mg total) by mouth daily.   No facility-administered encounter medications on file as of 06/08/2024.    Surgical History: Past Surgical History:  Procedure Laterality Date   ABDOMINAL HYSTERECTOMY N/A 06/09/2015   Procedure: HYSTERECTOMY ABDOMINAL with Bilateral Salpingectomy;  Surgeon: Debby JINNY Dinsmore, MD;  Location: [facility] ORS;  Service: Gynecology;  Laterality: N/A;   CARPAL TUNNEL RELEASE Left 07/15/2023   Procedure: CARPAL TUNNEL RELEASE;  Surgeon: Kathlynn Sharper, MD;  Location: [facility];  Service: Orthopedics;  Laterality: Left;   CATARACT EXTRACTION W/PHACO Right  05/17/2023   Procedure: CATARACT EXTRACTION PHACO AND INTRAOCULAR LENS PLACEMENT (IOC) RIGHT MALYUGIN OMIDRIA 9.07 00:47.9;  Surgeon: Enola Feliciano Hugger, MD;  Location: [facility];  Service: Ophthalmology;  Laterality: Right;   CATARACT EXTRACTION W/PHACO Left 05/30/2023   Procedure: CATARACT EXTRACTION PHACO AND INTRAOCULAR LENS PLACEMENT (IOC) LEFT [name] 3.73 00:32.0;  Surgeon: Enola Feliciano Hugger, MD;  Location: [facility];  Service: Ophthalmology;  Laterality: Left;   CESAREAN  SECTION  8011,8006   COLONOSCOPY WITH PROPOFOL  N/A 10/24/2020   Procedure: COLONOSCOPY WITH PROPOFOL ;  Surgeon: Jinny Carmine, MD;  Location: [facility];  Service: Endoscopy;  Laterality: N/A;  sleep apnea   DORSAL COMPARTMENT RELEASE Left 07/15/2023   Procedure: RELEASE DORSAL COMPARTMENT (DEQUERVAIN);  Surgeon: Kathlynn Sharper, MD;  Location: [facility];  Service: Orthopedics;  Laterality: Left;   FINGER SURGERY  1992   FLEXIBLE SIGMOIDOSCOPY N/A 07/07/2023   Procedure: FLEXIBLE SIGMOIDOSCOPY;  Surgeon: Therisa Bi, MD;  Location: [facility] ENDOSCOPY;  Service: Gastroenterology;  Laterality: N/A;  Unsedated per office; patient states she might want sedation   GANGLION CYST EXCISION Left 07/15/2023   Procedure: REMOVAL GANGLION OF WRIST;  Surgeon: Kathlynn Sharper, MD;  Location: [facility];  Service: Orthopedics;  Laterality: Left;   GASTRIC BYPASS     INGUINAL HERNIA REPAIR Right 2010   REPLACEMENT TOTAL KNEE Right     Medical History: Past Medical History:  Diagnosis Date   Allergy    environmental   Anemia    Asthma    Bronchitis, acute    Complication of anesthesia    HAD ASTHMA ATTACK WHILE UNDER   Diabetes mellitus without complication (HCC)    Gastroesophageal reflux disease without esophagitis 11/02/2017   Grade I diastolic dysfunction    Hypertension    Mild mitral regurgitation by prior echocardiogram    Morbid obesity with body mass index (BMI) of 45.0 to 49.9 in adult [facility])    Obstructive sleep apnea on CPAP    Seizure (HCC)    None since 01/2023   Sleep apnea    CPAP   Vertigo    10/24    Family History: Family History  Problem Relation Age of Onset   Diabetes Mother    Hypertension Mother    Cancer Mother    Congestive Heart Failure Father    Diabetes Father     Social History   Socioeconomic History   Marital status: Married    Spouse name: Not on file   Number of children: Not on file   Years of education: Not on file   Highest  education level: Not on file  Occupational History   Not on file  Tobacco Use   Smoking status: Never   Smokeless tobacco: Never  Vaping Use   Vaping status: Never Used  Substance and Sexual Activity   Alcohol use: No   Drug use: No   Sexual activity: Yes  Other Topics Concern   Not on file  Social History Narrative   Not on file   Social Drivers of Health   Tobacco Use: Low Risk (06/08/2024)   Patient History    Smoking Tobacco Use: Never    Smokeless Tobacco Use: Never    Passive Exposure: Not on file  Financial Resource Strain: Not on file  Food Insecurity: Not on file  Transportation Needs: Not on file  Physical Activity: Not on file  Stress: Not on file  Social Connections: Not on file  Intimate Partner Violence: Not on  file  Depression (PHQ2-9): Low Risk (03/30/2024)   Depression (PHQ2-9)    PHQ-2 Score: 0  Alcohol Screen: Not on file  Housing: Unknown (07/18/2023)   Received from [facility]   Epic    Unable to Pay for Housing in the Last Year: Not on file    Number of Times Moved in the Last Year: Not on file    At any time in the past 12 months, were you homeless or living in a shelter (including now)?: No  Utilities: Not on file  Health Literacy: Not on file      Review of Systems  Constitutional:  Positive for fatigue. Negative for chills and unexpected weight change.  HENT:  Negative for congestion, postnasal drip, rhinorrhea, sneezing and sore throat.   Eyes:  Negative for redness.  Respiratory:  Positive for shortness of breath (intermittent). Negative for cough, chest tightness and wheezing.   Cardiovascular: Negative.  Negative for chest pain and palpitations.  Gastrointestinal: Negative.  Negative for abdominal pain, constipation, diarrhea, nausea and vomiting.  Genitourinary:  Negative for dysuria and frequency.  Musculoskeletal:  Positive for arthralgias. Negative for back pain, joint swelling and neck pain.  Skin:  Negative  for rash.  Neurological: Negative.  Negative for tremors and numbness.  Hematological:  Negative for adenopathy. Does not bruise/bleed easily.  Psychiatric/Behavioral:  Negative for behavioral problems (Depression), self-injury, sleep disturbance and suicidal ideas. The patient is not nervous/anxious.     Vital Signs: BP 136/88   Pulse 91   Temp (!) 96.1 F (35.6 C)   Resp 16   Ht 5' 2 (1.575 m)   Wt 265 lb 9.6 oz (120.5 kg)   LMP 05/28/2015   SpO2 97%   BMI 48.58 kg/m    Physical Exam Vitals reviewed.  Constitutional:      General: She is not in acute distress.    Appearance: Normal appearance. She is obese. She is not ill-appearing.  HENT:     Head: Normocephalic and atraumatic.  Eyes:     Pupils: Pupils are equal, round, and reactive to light.  Cardiovascular:     Rate and Rhythm: Normal rate and regular rhythm.     Heart sounds: Normal heart sounds. No murmur heard. Pulmonary:     Effort: Pulmonary effort is normal. No respiratory distress.     Breath sounds: Normal breath sounds. No wheezing.  Skin:    General: Skin is warm and dry.     Capillary Refill: Capillary refill takes less than 2 seconds.  Neurological:     Mental Status: She is alert and oriented to person, place, and time.  Psychiatric:        Mood and Affect: Mood normal.        Behavior: Behavior normal.        Assessment/Plan: 1. Type 2 diabetes mellitus with other specified complication, without long-term current use of insulin (HCC) (Primary) A1c is improved and stable, continue metformin  as prescribed. Continue diabetic diet, urine sent to lab for ACR.  - POCT glycosylated hemoglobin (Hb A1C) - Urine Microalbumin w/creat. ratio  2. Hypertension associated with type 2 diabetes mellitus (HCC) Stable, Continue irbesartan  as prescribed  - irbesartan  (AVAPRO ) 75 MG tablet; Take 0.5 tablets (37.5 mg total) by mouth daily.  Dispense: 15 tablet; Refill: 5  3. Hyperlipidemia associated with  type 2 diabetes mellitus (HCC) Continue low cholesterol low fat diet, increase physical activity as tolerated.   4. Hypokalemia Continue prescription potassium supplement as  prescribed.   5. Gastroesophageal reflux disease without esophagitis Recommended tums OTC as needed. Can try OTC pepcid  if desired. Has upcoming visit with GI specialist.    General Counseling: Beverlee verbalizes understanding of the findings of todays visit and agrees with plan of treatment. I have discussed any further diagnostic evaluation that may be needed or ordered today. We also reviewed her medications today. she has been encouraged to call the office with any questions or concerns that should arise related to todays visit.    Orders Placed This Encounter  Procedures   Urine Microalbumin w/creat. ratio   POCT glycosylated hemoglobin (Hb A1C)    Meds ordered this encounter  Medications   irbesartan  (AVAPRO ) 75 MG tablet    Sig: Take 0.5 tablets (37.5 mg total) by mouth daily.    Dispense:  15 tablet    Refill:  5    For future refills, fill if due    Return for F/U, Recheck A1C, Attila Mccarthy PCP in may or june .   Total time spent:30 Minutes Time spent includes review of chart, medications, test results, and follow up plan with the patient.   [place] Controlled Substance Database was reviewed by me.  This patient was seen by Mardy Maxin, FNP-C in collaboration with Dr. Sigrid Bathe as a part of collaborative care agreement.   Brita Jurgensen R. Maxin, MSN, FNP-C Internal medicine

## 2024-06-09 LAB — MICROALBUMIN / CREATININE URINE RATIO
Creatinine, Urine: 27.8 mg/dL
Microalb/Creat Ratio: <11 mg/g{creat} (ref 0–29)
Microalbumin, Urine: <3 ug/mL

## 2024-06-20 NOTE — Procedures (Signed)
 [facility]  Polysomnogram Report Part I                                                               Phone: [phone] Fax: [phone]  Patient Name: [name], Jessica E. Acquisition Number: [id number]  Date of Birth: [birth year] Acquisition Date: 06/01/2024  Referring Physician: Elfreda Bathe, MD     History: The patient is a 54 year old  who was referred for evaluation of . Medical History: Allergy, anemia, asthma, bronchitis-acute, complication of anesthesia, diabetes mellitus, gastroesophageal reflux disease, hypertension, morbid obesity, OSA, seizure, vertigo.  Medications: albuterol , beclomethasone, blood glucose monitoring, clonazePAM, gabapentin, irbesartan , lamoTRIgine , metformin , metoprolol  succinate, nortriptyline, potassium chloride , quetiapine, torsemide .  Procedure: This routine overnight polysomnogram was performed on the [facility] 5 using the standard diagnostic protocol. This included 6 channels of EEG, 2 channels of EOG, chin EMG, bilateral anterior tibialis EMG, nasal/oral thermistor, PTAF (nasal pressure transducer), chest and abdominal wall movements, EKG, and pulse oximetry.  Description: The total recording time was 383.5 minutes. The total sleep time was 362.5 minutes. There were a total of 14.0 minutes of wakefulness after sleep onset for a goodsleep efficiency of 94.5%. The latency to sleep onset was shortat 7.0 minutes. The R sleep onset latency was within normal limits at 95.0 minutes. Sleep parameters, as a percentage of the total sleep time, demonstrated 10.5% of sleep was in N1 sleep, 62.5% N2, 0.3% N3 and 26.8% R sleep. There were a total of 60 arousals for an arousal index of 9.9 arousals per hour of sleep that was normal.  Respiratory monitoring demonstrated   snoring . Only 4 respiratory events were observed. The baseline oxygen saturation during wakefulness was 97%, during NREM sleep averaged 98%, and during REM sleep averaged 98%. The total duration of oxygen <  90% was 0.0 minutes.  Cardiac monitoring-  significant cardiac rhythm irregularities.   Periodic limb movement monitoring- did not demonstrate periodic limb movements.   Impression: This routine overnight polysomnogram did not demonstrate significant obstructive sleep apnea with only 4 respiratory events observed. The patient has been using CPAP at home which can cause an underestimation of the presence of sleep apnea.    Sleep efficiency was good with only a reduced percentage of slow wave sleep observed.   Recommendations:     The patient did not demonstrate significant sleep apnea in this study Weight loss is recommended in a patient with a BMI of 45.9.    Jessica RONAL Bathe, MD, [facility] Diplomate ABMS-Pulmonary, Critical Care and Sleep Medicine  Electronically reviewed and digitally signed  [facility] Polysomnogram Report Part II  Phone: [phone] Fax: [phone]  Patient last name Frerichs Neck Size  18.0 in. Acquisition [phone]  Patient first name Jessica E. Weight 251.0 lbs. Started 06/01/2024 at 11:01:17 PM  Birth date 12-21-1970 Height 62.0 in. Stopped 06/02/2024 at 5:34:35 AM  Age 23 BMI 45.9 lb./in2 Duration 383.5  Study Type Adult      Raford Sprang. RPSGT. // Daril Burr  Reviewed by: Marval MATSU. Henke, PhD, ABSM, FAASM Sleep Data: Lights Out: 11:06:47 PM Sleep Onset: 11:13:47 PM  Lights On: 5:30:17 AM Sleep Efficiency: 94.5 %  Total Recording Time: 383.5 min Sleep Latency (from Lights Off) 7.0 min  Total Sleep Time (  TST): 362.5 min R Latency (from Sleep Onset): 95.0 min  Sleep Period Time: 376.5 min Total number of awakenings: 10  Wake during sleep: 14.0 min Wake After Sleep Onset (WASO): 14.0 min   Sleep Data:         Arousal Summary: Stage  Latency from lights out (min) Latency from sleep onset (min) Duration (min) % Total Sleep Time  Normal values  N 1 7.0 0.0 38.0 10.5 (5%)  N 2 9.0 2.0 226.5 62.5 (50%)  N 3 24.0 17.0 1.0 0.3 (20%)  R 102.0 95.0  97.0 26.8 (25%)   Number Index  Spontaneous 61 10.1  Apneas & Hypopneas 0 0.0  RERAs 0 0.0       (Apneas & Hypopneas & RERAs)  (0) (0.0)  Limb Movement 0 0.0  Snore 0 0.0  TOTAL 61 10.1     Respiratory Data:  CA OA MA Apnea Hypopnea* A+ H RERA Total  Number 0 0 0 0 4 4 0 4  Mean Dur (sec) 0.0 0.0 0.0 0.0 17.4 17.4 0.0 17.4  Max Dur (sec) 0.0 0.0 0.0 0.0 24.0 24.0 0.0 24.0  Total Dur (min) 0.0 0.0 0.0 0.0 1.2 1.2 0.0 1.2  % of TST 0.0 0.0 0.0 0.0 0.3 0.3 0.0 0.3  Index (#/h TST) 0.0 0.0 0.0 0.0 0.7 0.7 0.0 0.7  *Hypopneas scored based on 4% or greater desaturation.  Sleep Stage:        REM NREM TST  AHI 0.6 0.7 0.7  RDI 0.6 0.7 0.7           Body Position Data:  Sleep (min) TST (%) REM (min) NREM (min) CA (#) OA (#) MA (#) HYP (#) AHI (#/h) RERA (#) RDI (#/h) Desat (#)  Supine 197.5 54.48 58.0 139.5 0 0 0 1 0.3 0 0.3 3  Non-Supine 165.00 45.52 39.00 126.00 0.00 0.00 0.00 3.00 1.09 0 1.09 4.00  Right: 165.0 45.52 39.0 126.0 0 0 0 3 1.1 0 1.1 4     Snoring: Total number of snoring episodes  0  Total time with snoring    min (   % of sleep)   Oximetry Distribution:             WK REM NREM TOTAL  Average (%)   97 98 98 98  < 90% 0.0 0.0 0.0 0.0  < 80% 0.0 0.0 0.0 0.0  < 70% 0.0 0.0 0.0 0.0  # of Desaturations* 0 3 4 7   Desat Index (#/hour) 0.0 1.9 0.9 1.2  Desat Max (%) 0 6 8 8   Desat Max Dur (sec) 0.0 10.0 14.0 14.0  Approx Min O2 during sleep 88  Approx min O2 during a respiratory event 88  Was Oxygen added (Y/N) and final rate :    LPM  *Desaturations based on 4% or greater drop from baseline.   Cheyne Stokes Breathing: None Present   Heart Rate Summary:  Average Heart Rate During Sleep 77.2 bpm      Highest Heart Rate During Sleep (95th %) 84.0 bpm      Highest Heart Rate During Sleep 95 bpm      Highest Heart Rate During Recording (TIB) 188 bpm (artifact)   Heart Rate Observations: Event Type # Events   Bradycardia 0 Lowest HR Scored:  N/A  Sinus Tachycardia During Sleep 0 Highest HR Scored: N/A  Narrow Complex Tachycardia 0 Highest HR Scored: N/A  Wide Complex Tachycardia 0 Highest HR Scored: N/A  Asystole 0 Longest Pause: N/A  Atrial Fibrillation 0 Duration Longest Event: N/A  Other Arrythmias   Type:    Periodic Limb Movement Data: (Primary legs unless otherwise noted) Total # Limb Movement 0 Limb Movement Index 0.0  Total # PLMS    PLMS Index     Total # PLMS Arousals    PLMS Arousal Index     Percentage Sleep Time with PLMS   min (   % sleep)  Mean Duration limb movements (secs)

## 2024-06-23 ENCOUNTER — Other Ambulatory Visit: Payer: Self-pay | Admitting: Nurse Practitioner

## 2024-06-23 DIAGNOSIS — E1169 Type 2 diabetes mellitus with other specified complication: Secondary | ICD-10-CM

## 2024-06-24 ENCOUNTER — Other Ambulatory Visit: Payer: Self-pay | Admitting: Nurse Practitioner

## 2024-06-24 DIAGNOSIS — R000 Tachycardia, unspecified: Secondary | ICD-10-CM

## 2024-06-25 ENCOUNTER — Telehealth: Payer: Self-pay | Admitting: Nurse Practitioner

## 2024-06-25 NOTE — Telephone Encounter (Signed)
 Patient called Friday evening stating she was not feeling well and has fever. I advised patient to go to either urgent care or ED-[facility]

## 2024-07-02 ENCOUNTER — Ambulatory Visit: Admitting: Internal Medicine

## 2024-07-06 ENCOUNTER — Other Ambulatory Visit: Payer: Self-pay | Admitting: Nurse Practitioner

## 2024-07-06 MED ORDER — ONDANSETRON HCL 4 MG PO TABS: 4.0000 mg | ORAL_TABLET | Freq: Three times a day (TID) | ORAL | 0 refills | Status: AC | PRN

## 2024-07-10 ENCOUNTER — Ambulatory Visit: Admitting: Internal Medicine

## 2024-07-30 ENCOUNTER — Ambulatory Visit: Admitting: Internal Medicine

## 2024-11-12 ENCOUNTER — Ambulatory Visit: Admitting: Nurse Practitioner

## 2025-04-01 ENCOUNTER — Ambulatory Visit: Admitting: Nurse Practitioner
